# Patient Record
Sex: Male | Born: 1955
Health system: Southern US, Community
[De-identification: ages and names within clinical notes are randomized; demographics above are authoritative.]

## PROBLEM LIST (undated history)

## (undated) DIAGNOSIS — K5790 Diverticulosis of intestine, part unspecified, without perforation or abscess without bleeding: Secondary | ICD-10-CM

## (undated) DIAGNOSIS — I2510 Atherosclerotic heart disease of native coronary artery without angina pectoris: Secondary | ICD-10-CM

## (undated) DIAGNOSIS — I219 Acute myocardial infarction, unspecified: Secondary | ICD-10-CM

## (undated) DIAGNOSIS — F32A Depression, unspecified: Secondary | ICD-10-CM

## (undated) DIAGNOSIS — E039 Hypothyroidism, unspecified: Secondary | ICD-10-CM

## (undated) DIAGNOSIS — A0472 Enterocolitis due to Clostridium difficile, not specified as recurrent: Secondary | ICD-10-CM

## (undated) DIAGNOSIS — I509 Heart failure, unspecified: Secondary | ICD-10-CM

## (undated) DIAGNOSIS — F329 Major depressive disorder, single episode, unspecified: Secondary | ICD-10-CM

## (undated) DIAGNOSIS — T1491XA Suicide attempt, initial encounter: Secondary | ICD-10-CM

## (undated) DIAGNOSIS — K219 Gastro-esophageal reflux disease without esophagitis: Secondary | ICD-10-CM

## (undated) DIAGNOSIS — H269 Unspecified cataract: Secondary | ICD-10-CM

## (undated) DIAGNOSIS — I671 Cerebral aneurysm, nonruptured: Secondary | ICD-10-CM

## (undated) DIAGNOSIS — I639 Cerebral infarction, unspecified: Secondary | ICD-10-CM

## (undated) HISTORY — PX: APPENDECTOMY: SHX54

## (undated) HISTORY — PX: CHOLECYSTECTOMY: SHX55

## (undated) HISTORY — PX: HERNIA REPAIR: SHX51

## (undated) HISTORY — DX: Unspecified cataract: H26.9

## (undated) HISTORY — DX: Acute myocardial infarction, unspecified: I21.9

## (undated) HISTORY — PX: DIAGNOSTIC LAPAROSCOPY: SUR761

---

## 1998-05-10 ENCOUNTER — Inpatient Hospital Stay (HOSPITAL_COMMUNITY): Admission: EM | Admit: 1998-05-10 | Discharge: 1998-05-14 | Payer: Self-pay | Admitting: Emergency Medicine

## 1998-05-10 ENCOUNTER — Encounter: Payer: Self-pay | Admitting: Internal Medicine

## 1998-05-12 ENCOUNTER — Encounter: Payer: Self-pay | Admitting: Internal Medicine

## 2011-01-06 ENCOUNTER — Emergency Department: Payer: Self-pay | Admitting: Emergency Medicine

## 2014-05-23 LAB — COMPREHENSIVE METABOLIC PANEL
Albumin: 3.3 g/dL — ABNORMAL LOW (ref 3.4–5.0)
Alkaline Phosphatase: 24 U/L — ABNORMAL LOW
Anion Gap: 8 (ref 7–16)
BUN: 18 mg/dL (ref 7–18)
Bilirubin,Total: 0.4 mg/dL (ref 0.2–1.0)
Calcium, Total: 8.3 mg/dL — ABNORMAL LOW (ref 8.5–10.1)
Chloride: 106 mmol/L (ref 98–107)
Co2: 27 mmol/L (ref 21–32)
Creatinine: 1.25 mg/dL (ref 0.60–1.30)
EGFR (African American): >60
EGFR (Non-African Amer.): >60
Glucose: 106 mg/dL — ABNORMAL HIGH (ref 65–99)
Osmolality: 284 (ref 275–301)
Potassium: 3.8 mmol/L (ref 3.5–5.1)
SGOT(AST): 32 U/L (ref 15–37)
SGPT (ALT): 35 U/L
Sodium: 141 mmol/L (ref 136–145)
Total Protein: 7.5 g/dL (ref 6.4–8.2)

## 2014-05-23 LAB — CBC WITH DIFFERENTIAL/PLATELET
Basophil #: 0.1 10*3/uL (ref 0.0–0.1)
Basophil %: 0.7 %
Eosinophil #: 0.2 10*3/uL (ref 0.0–0.7)
Eosinophil %: 2.2 %
HCT: 38 % — ABNORMAL LOW (ref 40.0–52.0)
HGB: 12.7 g/dL — ABNORMAL LOW (ref 13.0–18.0)
Lymphocyte #: 1.5 10*3/uL (ref 1.0–3.6)
Lymphocyte %: 19.4 %
MCH: 29.8 pg (ref 26.0–34.0)
MCHC: 33.5 g/dL (ref 32.0–36.0)
MCV: 89 fL (ref 80–100)
Monocyte #: 1.4 x10 3/mm — ABNORMAL HIGH (ref 0.2–1.0)
Monocyte %: 17.7 %
Neutrophil #: 4.7 10*3/uL (ref 1.4–6.5)
Neutrophil %: 60 %
Platelet: 281 10*3/uL (ref 150–440)
RBC: 4.28 10*6/uL — ABNORMAL LOW (ref 4.40–5.90)
RDW: 12.9 % (ref 11.5–14.5)
WBC: 7.8 10*3/uL (ref 3.8–10.6)

## 2014-05-23 LAB — PROTIME-INR
INR: 1
Prothrombin Time: 13.3 secs (ref 11.5–14.7)

## 2014-05-23 LAB — TROPONIN I: Troponin-I: <0.02 ng/mL

## 2014-05-23 LAB — APTT: Activated PTT: 32 secs (ref 23.6–35.9)

## 2014-05-24 ENCOUNTER — Inpatient Hospital Stay: Payer: Self-pay | Admitting: Internal Medicine

## 2014-05-24 ENCOUNTER — Ambulatory Visit: Payer: Self-pay | Admitting: Neurology

## 2014-05-25 LAB — URINALYSIS, COMPLETE
BILIRUBIN, UR: NEGATIVE
Bacteria: NONE SEEN
Glucose,UR: NEGATIVE mg/dL (ref 0–75)
LEUKOCYTE ESTERASE: NEGATIVE
Nitrite: NEGATIVE
PH: 5 (ref 4.5–8.0)
Protein: 30
RBC,UR: 2 /HPF (ref 0–5)
Specific Gravity: 1.019 (ref 1.003–1.030)
Squamous Epithelial: NONE SEEN
WBC UR: 1 /HPF (ref 0–5)

## 2014-05-25 LAB — LIPID PANEL
Cholesterol: 101 mg/dL (ref 0–200)
HDL Cholesterol: 27 mg/dL — ABNORMAL LOW (ref 40–60)
Ldl Cholesterol, Calc: 53 mg/dL (ref 0–100)
Triglycerides: 103 mg/dL (ref 0–200)
VLDL Cholesterol, Calc: 21 mg/dL (ref 5–40)

## 2014-05-25 LAB — TSH: Thyroid Stimulating Horm: 0.211 u[IU]/mL — ABNORMAL LOW

## 2014-05-25 LAB — HEMOGLOBIN A1C: Hemoglobin A1C: 5.1 % (ref 4.2–6.3)

## 2014-05-26 LAB — URINE CULTURE

## 2014-05-27 LAB — CREATININE, SERUM
Creatinine: 1.27 mg/dL (ref 0.60–1.30)
EGFR (African American): >60

## 2014-05-30 DIAGNOSIS — A0472 Enterocolitis due to Clostridium difficile, not specified as recurrent: Secondary | ICD-10-CM

## 2014-05-30 HISTORY — DX: Enterocolitis due to Clostridium difficile, not specified as recurrent: A04.72

## 2014-06-28 ENCOUNTER — Inpatient Hospital Stay: Payer: Self-pay | Admitting: Internal Medicine

## 2014-06-28 LAB — URINALYSIS, COMPLETE
Bilirubin,UR: NEGATIVE
Blood: NEGATIVE
Glucose,UR: NEGATIVE mg/dL (ref 0–75)
Hyaline Cast: 9
Leukocyte Esterase: NEGATIVE
NITRITE: NEGATIVE
PROTEIN: NEGATIVE
Ph: 5 (ref 4.5–8.0)
Specific Gravity: 1.011 (ref 1.003–1.030)
Squamous Epithelial: NONE SEEN
WBC UR: 1 /HPF (ref 0–5)

## 2014-06-28 LAB — CBC
HCT: 44.4 % (ref 40.0–52.0)
HGB: 14.3 g/dL (ref 13.0–18.0)
MCH: 28 pg (ref 26.0–34.0)
MCHC: 32.1 g/dL (ref 32.0–36.0)
MCV: 87 fL (ref 80–100)
PLATELETS: 282 10*3/uL (ref 150–440)
RBC: 5.1 10*6/uL (ref 4.40–5.90)
RDW: 14.1 % (ref 11.5–14.5)
WBC: 39.8 10*3/uL — ABNORMAL HIGH (ref 3.8–10.6)

## 2014-06-28 LAB — COMPREHENSIVE METABOLIC PANEL
ALBUMIN: 3 g/dL — AB (ref 3.4–5.0)
ALK PHOS: 11 U/L — AB
AST: 14 U/L — AB (ref 15–37)
Anion Gap: 11 (ref 7–16)
BUN: 15 mg/dL (ref 7–18)
Bilirubin,Total: 0.4 mg/dL (ref 0.2–1.0)
CHLORIDE: 103 mmol/L (ref 98–107)
Calcium, Total: 8.4 mg/dL — ABNORMAL LOW (ref 8.5–10.1)
Co2: 23 mmol/L (ref 21–32)
Creatinine: 1.27 mg/dL (ref 0.60–1.30)
Glucose: 113 mg/dL — ABNORMAL HIGH (ref 65–99)
Osmolality: 275 (ref 275–301)
Potassium: 3.6 mmol/L (ref 3.5–5.1)
SGPT (ALT): 20 U/L
Sodium: 137 mmol/L (ref 136–145)
Total Protein: 6.7 g/dL (ref 6.4–8.2)

## 2014-06-28 LAB — TROPONIN I: Troponin-I: <0.02 ng/mL

## 2014-06-29 LAB — CBC WITH DIFFERENTIAL/PLATELET
Bands: 14 %
Comment - H1-Com1: NORMAL
HCT: 36.8 % — ABNORMAL LOW (ref 40.0–52.0)
HGB: 11.8 g/dL — AB (ref 13.0–18.0)
Lymphocytes: 3 %
MCH: 28 pg (ref 26.0–34.0)
MCHC: 32.1 g/dL (ref 32.0–36.0)
MCV: 87 fL (ref 80–100)
Monocytes: 4 %
PLATELETS: 238 10*3/uL (ref 150–440)
RBC: 4.21 10*6/uL — ABNORMAL LOW (ref 4.40–5.90)
RDW: 13.8 % (ref 11.5–14.5)
SEGMENTED NEUTROPHILS: 79 %
WBC: 43.3 10*3/uL — ABNORMAL HIGH (ref 3.8–10.6)

## 2014-06-29 LAB — MAGNESIUM: Magnesium: 1 mg/dL — ABNORMAL LOW

## 2014-06-29 LAB — BASIC METABOLIC PANEL
ANION GAP: 9 (ref 7–16)
BUN: 11 mg/dL (ref 7–18)
CALCIUM: 7.1 mg/dL — AB (ref 8.5–10.1)
CHLORIDE: 108 mmol/L — AB (ref 98–107)
CREATININE: 1.19 mg/dL (ref 0.60–1.30)
Co2: 21 mmol/L (ref 21–32)
GLUCOSE: 100 mg/dL — AB (ref 65–99)
Osmolality: 275 (ref 275–301)
POTASSIUM: 4 mmol/L (ref 3.5–5.1)
Sodium: 138 mmol/L (ref 136–145)

## 2014-06-29 LAB — CLOSTRIDIUM DIFFICILE(ARMC)

## 2014-06-30 LAB — CBC WITH DIFFERENTIAL/PLATELET
BASOS PCT: 0.1 %
Basophil #: 0 10*3/uL (ref 0.0–0.1)
Eosinophil #: 0 10*3/uL (ref 0.0–0.7)
Eosinophil %: 0 %
HCT: 32.5 % — ABNORMAL LOW (ref 40.0–52.0)
HGB: 10.6 g/dL — ABNORMAL LOW (ref 13.0–18.0)
LYMPHS PCT: 3.7 %
Lymphocyte #: 1 10*3/uL (ref 1.0–3.6)
MCH: 28.2 pg (ref 26.0–34.0)
MCHC: 32.7 g/dL (ref 32.0–36.0)
MCV: 86 fL (ref 80–100)
MONOS PCT: 4.1 %
Monocyte #: 1.1 x10 3/mm — ABNORMAL HIGH (ref 0.2–1.0)
NEUTROS ABS: 24.8 10*3/uL — AB (ref 1.4–6.5)
NEUTROS PCT: 92.1 %
Platelet: 215 10*3/uL (ref 150–440)
RBC: 3.77 10*6/uL — AB (ref 4.40–5.90)
RDW: 14.3 % (ref 11.5–14.5)
WBC: 26.9 10*3/uL — ABNORMAL HIGH (ref 3.8–10.6)

## 2014-06-30 LAB — BASIC METABOLIC PANEL
ANION GAP: 7 (ref 7–16)
BUN: 9 mg/dL (ref 7–18)
CREATININE: 1 mg/dL (ref 0.60–1.30)
Calcium, Total: 7.2 mg/dL — ABNORMAL LOW (ref 8.5–10.1)
Chloride: 108 mmol/L — ABNORMAL HIGH (ref 98–107)
Co2: 24 mmol/L (ref 21–32)
GLUCOSE: 104 mg/dL — AB (ref 65–99)
OSMOLALITY: 277 (ref 275–301)
POTASSIUM: 3.4 mmol/L — AB (ref 3.5–5.1)
SODIUM: 139 mmol/L (ref 136–145)

## 2014-06-30 LAB — MAGNESIUM: Magnesium: 1.8 mg/dL

## 2014-07-01 LAB — BASIC METABOLIC PANEL
ANION GAP: 7 (ref 7–16)
BUN: 6 mg/dL — AB (ref 7–18)
CO2: 24 mmol/L (ref 21–32)
Calcium, Total: 7.3 mg/dL — ABNORMAL LOW (ref 8.5–10.1)
Chloride: 111 mmol/L — ABNORMAL HIGH (ref 98–107)
Creatinine: 0.95 mg/dL (ref 0.60–1.30)
EGFR (African American): >60
EGFR (Non-African Amer.): >60
Glucose: 104 mg/dL — ABNORMAL HIGH (ref 65–99)
OSMOLALITY: 281 (ref 275–301)
Potassium: 3.4 mmol/L — ABNORMAL LOW (ref 3.5–5.1)
Sodium: 142 mmol/L (ref 136–145)

## 2014-07-01 LAB — MAGNESIUM: Magnesium: 1.6 mg/dL — ABNORMAL LOW

## 2014-07-01 LAB — CBC WITH DIFFERENTIAL/PLATELET
BASOS ABS: 0 10*3/uL (ref 0.0–0.1)
Basophil %: 0.3 %
Eosinophil #: 0.2 10*3/uL (ref 0.0–0.7)
Eosinophil %: 1.4 %
HCT: 31.4 % — ABNORMAL LOW (ref 40.0–52.0)
HGB: 10.3 g/dL — AB (ref 13.0–18.0)
LYMPHS ABS: 1.1 10*3/uL (ref 1.0–3.6)
Lymphocyte %: 10.1 %
MCH: 28.3 pg (ref 26.0–34.0)
MCHC: 32.9 g/dL (ref 32.0–36.0)
MCV: 86 fL (ref 80–100)
MONO ABS: 0.7 x10 3/mm (ref 0.2–1.0)
Monocyte %: 6.2 %
NEUTROS PCT: 82 %
Neutrophil #: 9.1 10*3/uL — ABNORMAL HIGH (ref 1.4–6.5)
Platelet: 215 10*3/uL (ref 150–440)
RBC: 3.64 10*6/uL — AB (ref 4.40–5.90)
RDW: 14.9 % — ABNORMAL HIGH (ref 11.5–14.5)
WBC: 11.2 10*3/uL — AB (ref 3.8–10.6)

## 2014-07-02 DIAGNOSIS — Z885 Allergy status to narcotic agent status: Secondary | ICD-10-CM | POA: Diagnosis not present

## 2014-07-02 DIAGNOSIS — R1310 Dysphagia, unspecified: Secondary | ICD-10-CM | POA: Diagnosis not present

## 2014-07-02 DIAGNOSIS — I2510 Atherosclerotic heart disease of native coronary artery without angina pectoris: Secondary | ICD-10-CM | POA: Diagnosis not present

## 2014-07-02 DIAGNOSIS — Z87891 Personal history of nicotine dependence: Secondary | ICD-10-CM | POA: Diagnosis not present

## 2014-07-02 DIAGNOSIS — A419 Sepsis, unspecified organism: Secondary | ICD-10-CM | POA: Diagnosis not present

## 2014-07-02 DIAGNOSIS — I5042 Chronic combined systolic (congestive) and diastolic (congestive) heart failure: Secondary | ICD-10-CM | POA: Diagnosis not present

## 2014-07-02 DIAGNOSIS — I69391 Dysphagia following cerebral infarction: Secondary | ICD-10-CM | POA: Diagnosis not present

## 2014-07-02 DIAGNOSIS — A047 Enterocolitis due to Clostridium difficile: Secondary | ICD-10-CM | POA: Diagnosis not present

## 2014-07-02 LAB — BASIC METABOLIC PANEL
Anion Gap: 9 (ref 7–16)
BUN: 3 mg/dL — ABNORMAL LOW (ref 7–18)
CREATININE: 0.86 mg/dL (ref 0.60–1.30)
Calcium, Total: 7.5 mg/dL — ABNORMAL LOW (ref 8.5–10.1)
Chloride: 110 mmol/L — ABNORMAL HIGH (ref 98–107)
Co2: 25 mmol/L (ref 21–32)
EGFR (African American): >60
EGFR (Non-African Amer.): >60
Glucose: 83 mg/dL (ref 65–99)
Osmolality: 283 (ref 275–301)
Potassium: 3.5 mmol/L (ref 3.5–5.1)
SODIUM: 144 mmol/L (ref 136–145)

## 2014-07-02 LAB — MAGNESIUM: Magnesium: 1.7 mg/dL — ABNORMAL LOW

## 2014-07-03 LAB — BASIC METABOLIC PANEL
Anion Gap: 7 (ref 7–16)
BUN: 3 mg/dL — ABNORMAL LOW (ref 7–18)
CHLORIDE: 108 mmol/L — AB (ref 98–107)
CREATININE: 0.85 mg/dL (ref 0.60–1.30)
Calcium, Total: 7.7 mg/dL — ABNORMAL LOW (ref 8.5–10.1)
Co2: 26 mmol/L (ref 21–32)
EGFR (African American): >60
EGFR (Non-African Amer.): >60
Glucose: 85 mg/dL (ref 65–99)
OSMOLALITY: 277 (ref 275–301)
Potassium: 3.4 mmol/L — ABNORMAL LOW (ref 3.5–5.1)
SODIUM: 141 mmol/L (ref 136–145)

## 2014-07-03 LAB — MAGNESIUM: Magnesium: 1.7 mg/dL — ABNORMAL LOW

## 2014-07-03 LAB — CULTURE, BLOOD (SINGLE)

## 2014-09-25 ENCOUNTER — Other Ambulatory Visit: Payer: Self-pay

## 2014-09-25 NOTE — Patient Outreach (Signed)
[name]) Care Management  09/25/2014  Donald [name] 08/08/1955 [id number]     RN CM spoke with patient about the services of [facility].  Patient stated did not need the services of [facility].  RN CM inquired to patient as how was he managing his chronic disease of congestive heart failure.  Patient states he was doing well with self-managing his heart failure.  Patient states he keeps his appointment with his cardiologist, [name] in [place], [place].  Patient states he monitors his weight.  States today his weight is 140 lbs.  States he understands his medications and why he takes it.  Patient stated he needed help with getting dentures and asked if Case Manager could help find resources.  RN CM found 5 resources in his area for affordable denture care.  RN CM will mail these resources to patient's home address and patient should receive the information in a few days.   RN CM will notify patient's primary doctor of patient's decision to decline the services of [facility].  Maury Dus, RN, Ishmael Holter, [place] Telephonic Care Coordinator [phone]

## 2014-09-29 NOTE — Patient Outreach (Signed)
[facility]) Care Management  09/29/2014  Donald [name] 1955/07/18 [id number]   Received notification from Maury Dus, RN to close case as patient refused to participate at this time.  Case closed at this time.  [name]. [place] CM Assistant Phone: [phone] Fax: [phone]

## 2014-10-06 ENCOUNTER — Ambulatory Visit
Admit: 2014-10-06 | Disposition: A | Discharge status: Home / Self Care | Payer: Self-pay | Attending: Unknown Physician Specialty | Admitting: Unknown Physician Specialty

## 2014-10-21 NOTE — Discharge Summary (Signed)
PATIENT NAME:  Donald [name], Donald [name] MR#:  [id number] DATE OF BIRTH:  February 09, 1956  DISCHARGE DIAGNOSES: 1.  Acute ischemic infarct of left insula and frontal operculum. 2.  Aspiration pneumonia.  3.  Sepsis.  4.  Dysphagia.  5.  Vascular dementia.  6.  Dysarthria.   CONSULTATIONS:  1.  Leotis Pain, MD with neurology.  2.  Speech therapy.   IMAGING STUDIES:  1.  CT scan of the head with contrast. Initial CT was normal other than chronic lacunar infarcts. Repeat CT showed acute evolving stroke.  2.  MRI of the brain showed acute infarct of the frontal operculum and insular area.  3.  Carotid Doppler showed no significant stenosis.  4.  Echocardiogram showed EF of 40% to 45%. No PFO or thrombus.   ADMITTING HISTORY AND PHYSICAL:  Please see dictated H and P dictated previously by Dr. Marcille Blanco. In brief, a 59 year old Caucasian male patient brought into the hospital with garbled speech, dysarthria, and facial droop. The patient was admitted for CVA.   HOSPITAL COURSE: 1.  Acute CVA. The patient had an MRI of the brain done which showed acute CVA.  The patient initially was n.p.o., was seen by speech, later started on a modified diet. His symptoms seem to worsen initially, but later improved. By day of discharge, the patient is feeling well, although does seem to have some vascular dementia, which has worsened. Has dysphagia, dysarthria. Has worked with PT and is being set up with home health PT and speech therapy. He is on aspirin, statin. Blood pressure medications have been stopped for permissive hypertension. 2.   Aspiration pneumonia with sepsis. The patient  secondary to his dysphagia from acute stroke, had aspiration, was not needing any oxygen. Has been started on antibiotics and has done well. No shortness of breath, has mild cough. He did have fever, which has resolved.  3.  Hypertension. The patient's blood pressure medications have been stopped for permissive hypertension. He is being  discharged on a low dose of Coreg.   Prior to discharge, the patient has right tongue deviation. Motor strength mildly decreased on the right side. He is awake, alert. Lungs sound clear. S1, S2 heard.   DISCHARGE MEDICATIONS: 1.  Aspirin 81 mg daily.  2.  Simvastatin 20 mg daily.  3.  Omeprazole 20 mg daily. 4.  Lasix 20 mg daily.  5.  Colace 100 mg 2 times a day.  6.  Niacin 250 mg oral once a day.  7.  Nitroglycerin 0.4 sublingual as needed for chest pain.  8.  Colestipol 1 gram oral 2 times a day.  9.  Acetaminophen 325 mg 2 tablets every 4 hours as needed.  10.  Flomax 0.4 mg oral once a day.  11.  Oxycodone 5 mg oral every 4 hours.  12.  Coreg 3.125 mg oral 2 times a day.  13.  Avelox 400 mg oral 2 times a day for 1 week.  14.  Aldactone and lisinopril were stopped.  DISCHARGE INSTRUCTIONS: Home health with physical therapy, speech therapy has been set up, low-sodium, low-fat diet with pureed nectar-thickened liquids, no straws. Strict aspiration precautions. Medicine pureed, moisten food well.  Follow up with primary care physician in 1-2 weeks.   TIME SPENT ON DAY OF DISCHARGE IN DISCHARGE ACTIVITY: 40 minutes.   ____________________________ Leia Alf Keion Neels, MD srs:LT D: 05/29/2014 16:45:28 ET T: 05/29/2014 18:32:43 ET JOB#: [id number]  cc: Alveta Heimlich R. Darvin Neighbours, MD, <Dictator> Neita Carp MD ELECTRONICALLY SIGNED 06/15/2014  13:05 

## 2014-10-21 NOTE — H&P (Signed)
PATIENT NAME:  Donald [name], Donald [name] MR#:  [id number] DATE OF BIRTH:  1955/08/17  DATE OF ADMISSION:  06/28/2014  REFERRING PHYSICIAN:  Debbrah Alar, MD  PRIMARY CARE PHYSICIAN:  Salome Holmes, MD  CHIEF COMPLAINT:  Weakness.   HISTORY OF PRESENT ILLNESS:  This is a 59 year old Caucasian gentleman with history of CVA with residual dysphagia and recent aspiration pneumonia, as well as Clostridium difficile, presenting with weakness. He was admitted to [facility] on 05/24/2014, to 05/27/2014, with discharge diagnosis of acute ischemic infarct  Course complicated by aspiration pneumonia and developed Clostridium difficile as an outpatient, taken last dose of Flagyl one day prior to arrival. Throughout that duration, he had poor p.o. intake originally given his dysphagia diet with thickened liquids and then had poor p.o. intake given his Clostridium difficile. Today, he complained of worsening weakness and had recurrent diarrhea. Denies any chest pain, shortness of breath, fevers, chills, or further symptomatology. He presented to the hospital for further workup and evaluation.   REVIEW OF SYSTEMS: CONSTITUTIONAL:  Denies fevers or chills. Positive for fatigue and weakness.  EYES:  Denies blurred vision, double vision, or eye pain.  EARS, NOSE, AND THROAT:  Denies tinnitus, ear pain, or hearing loss.  RESPIRATORY:  Denies cough, wheeze, or shortness of breath.  CARDIOVASCULAR:  Denies chest pain, palpitations, or edema.  GASTROINTESTINAL:  Denies any nausea, vomiting, or abdominal pain. Positive for diarrhea.  GENITOURINARY:  Denies dysuria or hematuria.  ENDOCRINE:  Denies nocturia or thyroid problems.  HEMATOLOGIC:  Denies easy bruising or bleeding. SKIN:  Denies any rash or lesions.  MUSCULOSKELETAL:  Denies any pain in the neck, back, shoulder, knees, or hips or arthritic symptoms.  NEUROLOGIC:  Denies paralysis or paresthesias.  PSYCHIATRIC:  Denies anxiety or depressive symptoms.    Otherwise, a full review of systems performed by me is negative.   PAST MEDICAL HISTORY:  Coronary artery disease, combined systolic and diastolic congestive heart failure, history of CVA with residual dysphagia.   SOCIAL HISTORY:  Remote tobacco abuse.   FAMILY HISTORY:  Positive for lung cancer but no known cardiovascular or pulmonary disorders.   ALLERGIES:  PENICILLIN, SULFA DRUGS, TRAMADOL.   HOME MEDICATIONS:  Include spironolactone 25 mg p.o. daily, aspirin 81 mg p.o. daily, lisinopril 10 mg p.o. daily, Flomax 0.4 mg p.o. daily, nitroglycerin 0.4 mg sublingually every 5 minutes as needed for chest pain, trazodone 100 mg p.o. at bedtime as needed, colestipol 1 gram 2 tablets b.i.d., niacin 250 mg p.o. daily, simvastatin 20 mg p.o. at bedtime, Plavix 75 mg p.o. daily, ketoconazole topical shampoo to affected area twice weekly, Lasix 20 mg p.o. daily, simethicone 125 mg daily as needed, pantoprazole 40 mg p.o. daily, multivitamin 1 tab p.o. daily, vitamin B12, 1000 mcg p.o. daily.   PHYSICAL EXAMINATION: VITAL SIGNS:  Temperature 97.8, heart rate 129, respirations 27, blood pressure 129/71, saturating 95% on room air. Weight 63.5 kg, BMI 24.8.  GENERAL:  Weak-appearing Caucasian gentleman, currently in no acute distress.  HEAD:  Normocephalic, atraumatic.  EYES:  Pupils are equal, round, and reactive to light. Extraocular muscles are intact. No scleral icterus.  MOUTH:  Dry mucosal membranes. Dentition is intact. No abscess noted.  EARS, NOSE, AND THROAT:  Clear without exudates. No external lesions.  NECK:  Supple. No thyromegaly. No nodules. No JVD.  PULMONARY:  Clear to auscultation bilaterally without wheezes, rubs, or rhonchi. No use of accessory muscles. Good respiratory effort.  CHEST:  Nontender to palpation.  CARDIOVASCULAR:  S1  and S2, regular rate and rhythm. No murmurs, rubs, or gallops. No edema. Pedal pulses are 2+ bilaterally.  GASTROINTESTINAL:  Soft, nontender,  nondistended. No masses. Positive bowel sounds. No hepatosplenomegaly.  MUSCULOSKELETAL:  No swelling, clubbing, or edema. Range of motion is full in all extremities.  NEUROLOGIC:  Cranial nerves II through XII are intact. No gross focal neurological deficits. Sensation is intact. Reflexes are intact.  SKIN:  No ulcerations, lesions, rash, or cyanosis. Skin is warm and dry. Turgor is intact.  PSYCHIATRIC:  Mood and affect within normal limits. The patient is awake, alert, and oriented x 3. Insight and judgment are intact.   LABORATORY DATA:  Sodium 137, potassium 3.6, chloride 103, bicarbonate 23, BUN 15, creatinine 1.27, glucose 113. LFTs:  Albumin 3, alkaline phosphatase 11, AST 14, otherwise within normal limits. WBC is 39.8, hemoglobin 14.3, and platelets 282,000. Urinalysis is negative for evidence of infection. Lactic acid is 1.3.   CT of the head performed reveals no acute findings. There is evidence of a subacute chronic left infarct. Chest x-ray reveals bibasilar opacities greater on the left, favor infection, difficult to exclude an early pneumonia of the left lung base.   ASSESSMENT AND PLAN:  This is a 59 year old Caucasian gentleman with a history of recent cerebrovascular accident with residual dysphagia and recent aspiration as well as Clostridium difficile, presenting with weakness.   1.  Sepsis, meeting septic criteria by heart rate and leukocytosis. Panculture including blood and urine and also send a stool sample for Clostridium difficile. Chest x-ray is concerning for pneumonia. Given recent hospitalization, we will cover for healthcare-associated pneumonia and suspect Clostridium difficile may also be a likely culprit. If Clostridium difficile positive and no further pulmonary symptoms, would actually stop the antibiotics currently for pneumonia. Provide intravenous fluid hydration. Keep mean arterial pressure greater than 65, however, will have low intravenous fluid rate given  combined systolic and diastolic congestive heart failure.  2.  Cerebrovascular accident with dysphagia. Continue dysphagia diet. We will also get speech therapy involved to see if he can be taken off of the dysphagia diet.  3.  Combined systolic and diastolic congestive heart failure, not in acute exacerbation. Continue with aspirin, beta blockade, lisinopril, and spironolactone. 4.  Gastroesophageal reflux disease.  Proton pump inhibitor therapy.  5.  Venous thromboembolism therapy with heparin subcutaneously.   CODE STATUS:  The patient is a full code.  TIME SPENT:  45 minutes.    ____________________________ Aaron Mose. Awanda Wilcock, MD dkh:nb D: 06/29/2014 01:19:46 ET T: 06/29/2014 01:52:46 ET JOB#: [id number]  cc: Aaron Mose. Amiere Cawley, MD, <Dictator> Emalea Mix Woodfin Ganja MD ELECTRONICALLY SIGNED 06/29/2014 3:01
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
## 2014-10-21 NOTE — Consult Note (Signed)
Brief Consult Note: Diagnosis: sepsis, leukocytosis, likely C diff.   Patient was seen by consultant.   Consult note dictated.   Recommend further assessment or treatment.   Orders entered.   Discussed with Attending MD.   Comments: Doubt pna as no cough or pulm sxs WIth impressive leukocytosis and diarrhea he likely has severe c diff.  Lactic acid is nml.  Abd exam benign - no evidence yet of toxic megacolon  Rec oral vanco and iv flagyl can stop other abx and monitor.  Electronic Signatures: Angelena Form (MD)  (Signed 31-Dec-15 10:45)  Authored: Brief Consult Note   Last Updated: 31-Dec-15 10:45 by Angelena Form (MD)

## 2014-10-25 NOTE — Consult Note (Signed)
PATIENT NAME:  Donald [name], SCHOEN MR#:  [id number] DATE OF BIRTH:  1956-02-07  DATE OF CONSULTATION:  06/29/2014  REFERRING PHYSICIAN:  Demetrios Loll, MD CONSULTING PHYSICIAN:  Cheral Marker. Ola Spurr, MD  REASON FOR CONSULTATION: Sepsis.   HISTORY OF PRESENT ILLNESS: This is a pleasant 59 year old gentleman with history of CVA with dysphagia and recurrent aspiration pneumonia, as well as prior recent Clostridium difficile infection. He was admitted on 06/28/2014 with decreased p.o. intake, weakness, and recurrent diarrhea. The patient was admitted to the intensive care unit for hemodynamic instability. His white blood count was markedly elevated at 39,000. We are consulted for further evaluation.   Currently, the patient has a rectal tube in. He is having a lot of diarrhea. He denies any cough, fevers, abdominal pain, dysuria, or skin lesions.   PAST MEDICAL HISTORY:  Coronary artery disease, CHF, CVA with residual dysphagia.    SURGICAL HISTORY: None.   SOCIAL HISTORY: Prior tobacco abuse.   FAMILY HISTORY: Positive for lung cancer.   ALLERGIES: PENICILLIN, SULFA, AND TRAMADOL.   REVIEW OF SYSTEMS: Eleven systems reviewed and negative, except as per HPI.   PHYSICAL EXAMINATION:  VITAL SIGNS: Temperature 98.5, pulse 99, blood pressure 118/66, respirations 22, saturation 95% on room air.  GENERAL: He is pleasant, awake, alert, able to converse with some difficulty.  HEENT: Pupils equal, round, reactive to light and accommodation. Oropharynx is clear. No thrush.  NECK: Supple.  HEART: Tachy, but regular.  LUNGS: Clear.  ABDOMEN: Soft, mildly distended, nontender. No rebound or guarding.  EXTREMITIES: No clubbing, cyanosis, or edema.  SKIN: No rash or lesions.   LABORATORY DATA: White blood count on admission was 39.8, today was 43.3; hemoglobin 11.8, platelets 238,000. Blood cultures on 06/28/2014: No growth to date. Clostridium difficile is pending. Urinalysis is negative. Lactic acid was  1.3.   IMAGING: CT of the Head: There is no acute disease. Chest X-ray: Basilar opacities; favor an infection.   IMPRESSION: A 59 year old gentleman with history of recurrent aspiration pneumonia due to his cerebrovascular accident with dysphagia, now with diarrhea and marked leukocytosis. He has a rectal tube in. He had a recent diagnosis of Clostridium difficile and was treated for this. It is unclear when he finished his treatment.   I suspect this is all Clostridium difficile. He has no other symptoms to suggest a pneumonia at this time. Chest x-ray does show some infiltrate versus atelectasis, but clinically he is stable.  RECOMMENDATIONS:  1.  Check Clostridium difficile.  2.  Start oral vancomycin and IV Flagyl.  3.  Would suggest stopping other antibiotics, pending further workup.  4.  We will monitor him closely for other infections as we treat the  presumed Clostridium difficile.  5.  Thank you for the consult. I will be glad to follow with you.    ____________________________ Cheral Marker. Ola Spurr, MD dpf:MT D: 07/01/2014 11:27:08 ET T: 07/01/2014 11:36:22 ET JOB#: [id number]  cc: Cheral Marker. Ola Spurr, MD, <Dictator> Farrin Shadle Ola Spurr MD ELECTRONICALLY SIGNED 07/02/2014 21:29

## 2014-10-29 NOTE — Discharge Summary (Signed)
PATIENT NAME:  Donald [name], Donald [name] MR#:  [id number] DATE OF BIRTH:  April 18, 1956  DATE OF ADMISSION:  06/28/2014 DATE OF DISCHARGE:  07/03/2014  PRIMARY CARE PHYSICIAN: Dr. Herbie Saxon.  DISCHARGE DIAGNOSES: Sepsis with severe Clostridium difficile colitis, hypokalemia, hypomagnesemia, history of cerebrovascular accident with dysphagia, chronic systolic and diastolic congestive heart failure.   CONDITION: Stable.   CODE STATUS: Full code.   HOME MEDICATIONS: Please refer to the medication reconciliation list.   DIET: Low sodium, low fat, low cholesterol diet. The patient also on mechanical soft, thin liquid diet with aspiration precautions. The patient needs home health with physical therapy, nurse and nurse aide.    ACTIVITY: As tolerated.    FOLLOWUP CARE: Follow up with PCP and Dr. Ola Spurr within 1 to 2 weeks.   REASON FOR ADMISSION: Weakness.   HOSPITAL COURSE: The patient is a Donald [name] with a history of CVA and recent aspiration pneumonia and Clostridium difficile who presented to the ED with weakness. The patient was recently treated for aspiration pneumonia and developed Clostridium difficile colitis, which was treated with Flagyl.  The patient came to ED due to weakness and recurrent diarrhea. For detailed history and physical examination, please refer to the admission note dictated by Dr. Lavetta Nielsen.   On admission date, the patient's sodium was 137, potassium 3.6, chloride 103, bicarbonate 23, BUN 15, creatinine 1.27, WBC 39.8, hemoglobin 14.3. CAT scan of head showed no acute findings. Chest x-ray reveals bibasilar opacity within the left, difficult to exclude an early pneumonia.  1.  Sepsis with severe Clostridium difficile colitis: After admission, initially the patient was treated with Flagyl since the patient's stool sample showed Clostridium difficile positive. The patient also had severe diarrhea multiple times a day. The patient has been treated with p.o. vancomycin.  The patient's WBC decreased to normal range. Abdominal pain, diarrhea has much improved. The patient has no complaints of abdominal pain or diarrhea today. Since the patient's chest x-ray showed questionable pneumonia, the patient was treated with Levaquin, but the patient has no respiratory symptoms, antibiotics were discontinued.  2.  Hypokalemia and hypomagnesemia due to diarrhea: The patient has been treated with potassium and magnesium supplement. The patient's potassium is 3.4, magnesium is 1.7. We will continue p.o. supplement after discharge.  3.  For CVA with dysphagia, the patient has been treated with aspirin, Plavix, Zocor. Speech study suggested a mechanical soft diet with thin liquid, aspiration precaution.  4.  Chronic systolic and diastolic CHF, stable.   The patient's symptoms have much improved. His vital signs are stable. He is clinically stable, will be discharged to home with home health and PT today. Discussed the patient's discharge plan with the patient and the patient's wife, nurse, case Freight forwarder.   TIME SPENT: About 42 minutes.   ____________________________ Demetrios Loll, MD qc:AT D: 07/03/2014 16:27:39 ET T: 07/03/2014 23:09:52 ET JOB#: [id number]  cc: Demetrios Loll, MD, <Dictator> Demetrios Loll MD ELECTRONICALLY SIGNED 07/04/2014 15:35

## 2014-11-23 ENCOUNTER — Emergency Department: Payer: Medicare PPO

## 2014-11-23 ENCOUNTER — Encounter: Payer: Self-pay | Admitting: Radiology

## 2014-11-23 ENCOUNTER — Inpatient Hospital Stay
Admission: EM | Admit: 2014-11-23 | Discharge: 2014-11-25 | DRG: 378 | Disposition: A | Discharge status: Home / Self Care | Payer: Medicare PPO | Attending: Internal Medicine | Admitting: Internal Medicine

## 2014-11-23 DIAGNOSIS — A0472 Enterocolitis due to Clostridium difficile, not specified as recurrent: Secondary | ICD-10-CM | POA: Insufficient documentation

## 2014-11-23 DIAGNOSIS — I69321 Dysphasia following cerebral infarction: Secondary | ICD-10-CM

## 2014-11-23 DIAGNOSIS — Z7902 Long term (current) use of antithrombotics/antiplatelets: Secondary | ICD-10-CM

## 2014-11-23 DIAGNOSIS — K5791 Diverticulosis of intestine, part unspecified, without perforation or abscess with bleeding: Principal | ICD-10-CM

## 2014-11-23 DIAGNOSIS — I2510 Atherosclerotic heart disease of native coronary artery without angina pectoris: Secondary | ICD-10-CM | POA: Diagnosis present

## 2014-11-23 DIAGNOSIS — Z79899 Other long term (current) drug therapy: Secondary | ICD-10-CM | POA: Diagnosis not present

## 2014-11-23 DIAGNOSIS — A047 Enterocolitis due to Clostridium difficile: Secondary | ICD-10-CM | POA: Diagnosis present

## 2014-11-23 DIAGNOSIS — Z7982 Long term (current) use of aspirin: Secondary | ICD-10-CM | POA: Diagnosis not present

## 2014-11-23 DIAGNOSIS — K5790 Diverticulosis of intestine, part unspecified, without perforation or abscess without bleeding: Secondary | ICD-10-CM | POA: Diagnosis present

## 2014-11-23 DIAGNOSIS — Z880 Allergy status to penicillin: Secondary | ICD-10-CM | POA: Diagnosis not present

## 2014-11-23 DIAGNOSIS — I509 Heart failure, unspecified: Secondary | ICD-10-CM | POA: Diagnosis present

## 2014-11-23 DIAGNOSIS — I10 Essential (primary) hypertension: Secondary | ICD-10-CM | POA: Diagnosis present

## 2014-11-23 DIAGNOSIS — Z79891 Long term (current) use of opiate analgesic: Secondary | ICD-10-CM

## 2014-11-23 DIAGNOSIS — I671 Cerebral aneurysm, nonruptured: Secondary | ICD-10-CM | POA: Diagnosis present

## 2014-11-23 DIAGNOSIS — Z882 Allergy status to sulfonamides status: Secondary | ICD-10-CM | POA: Diagnosis not present

## 2014-11-23 DIAGNOSIS — E039 Hypothyroidism, unspecified: Secondary | ICD-10-CM | POA: Diagnosis present

## 2014-11-23 DIAGNOSIS — Z87891 Personal history of nicotine dependence: Secondary | ICD-10-CM

## 2014-11-23 DIAGNOSIS — K648 Other hemorrhoids: Secondary | ICD-10-CM | POA: Diagnosis present

## 2014-11-23 DIAGNOSIS — Z885 Allergy status to narcotic agent status: Secondary | ICD-10-CM | POA: Diagnosis not present

## 2014-11-23 DIAGNOSIS — K219 Gastro-esophageal reflux disease without esophagitis: Secondary | ICD-10-CM | POA: Diagnosis present

## 2014-11-23 DIAGNOSIS — K922 Gastrointestinal hemorrhage, unspecified: Secondary | ICD-10-CM | POA: Diagnosis present

## 2014-11-23 HISTORY — DX: Diverticulosis of intestine, part unspecified, without perforation or abscess without bleeding: K57.90

## 2014-11-23 HISTORY — DX: Enterocolitis due to Clostridium difficile, not specified as recurrent: A04.72

## 2014-11-23 HISTORY — DX: Cerebral aneurysm, nonruptured: I67.1

## 2014-11-23 HISTORY — DX: Gastro-esophageal reflux disease without esophagitis: K21.9

## 2014-11-23 HISTORY — DX: Atherosclerotic heart disease of native coronary artery without angina pectoris: I25.10

## 2014-11-23 HISTORY — DX: Heart failure, unspecified: I50.9

## 2014-11-23 HISTORY — DX: Hypothyroidism, unspecified: E03.9

## 2014-11-23 HISTORY — DX: Cerebral infarction, unspecified: I63.9

## 2014-11-23 LAB — TYPE AND SCREEN
ABO/RH(D): O POS
ANTIBODY SCREEN: NEGATIVE

## 2014-11-23 LAB — COMPREHENSIVE METABOLIC PANEL
ALK PHOS: 18 U/L — AB (ref 38–126)
ALT: 14 U/L — ABNORMAL LOW (ref 17–63)
ANION GAP: 9 (ref 5–15)
AST: 21 U/L (ref 15–41)
Albumin: 4.2 g/dL (ref 3.5–5.0)
BILIRUBIN TOTAL: 0.7 mg/dL (ref 0.3–1.2)
BUN: 31 mg/dL — AB (ref 6–20)
CHLORIDE: 102 mmol/L (ref 101–111)
CO2: 27 mmol/L (ref 22–32)
Calcium: 9.4 mg/dL (ref 8.9–10.3)
Creatinine, Ser: 1.49 mg/dL — ABNORMAL HIGH (ref 0.61–1.24)
GFR, EST AFRICAN AMERICAN: 58 mL/min — AB (ref >60)
GFR, EST NON AFRICAN AMERICAN: 50 mL/min — AB (ref >60)
Glucose, Bld: 104 mg/dL — ABNORMAL HIGH (ref 65–99)
Potassium: 4.6 mmol/L (ref 3.5–5.1)
Sodium: 138 mmol/L (ref 135–145)
TOTAL PROTEIN: 8 g/dL (ref 6.5–8.1)

## 2014-11-23 LAB — CBC
HCT: 33.5 % — ABNORMAL LOW (ref 40.0–52.0)
HCT: 34.4 % — ABNORMAL LOW (ref 40.0–52.0)
HEMATOCRIT: 37 % — AB (ref 40.0–52.0)
HEMOGLOBIN: 11.9 g/dL — AB (ref 13.0–18.0)
Hemoglobin: 12.2 g/dL — ABNORMAL LOW (ref 13.0–18.0)
Hemoglobin: 11.3 g/dL — ABNORMAL LOW (ref 13.0–18.0)
MCH: 28.8 pg (ref 26.0–34.0)
MCH: 29.2 pg (ref 26.0–34.0)
MCH: 30.2 pg (ref 26.0–34.0)
MCHC: 33 g/dL (ref 32.0–36.0)
MCHC: 33.7 g/dL (ref 32.0–36.0)
MCHC: 34.7 g/dL (ref 32.0–36.0)
MCV: 87.3 fL (ref 80.0–100.0)
MCV: 86.6 fL (ref 80.0–100.0)
MCV: 87.1 fL (ref 80.0–100.0)
Platelets: 280 10*3/uL (ref 150–440)
Platelets: 256 10*3/uL (ref 150–440)
Platelets: 234 10*3/uL (ref 150–440)
RBC: 4.23 MIL/uL — ABNORMAL LOW (ref 4.40–5.90)
RBC: 3.86 MIL/uL — ABNORMAL LOW (ref 4.40–5.90)
RBC: 3.95 MIL/uL — ABNORMAL LOW (ref 4.40–5.90)
RDW: 13.6 % (ref 11.5–14.5)
RDW: 13.4 % (ref 11.5–14.5)
RDW: 13.7 % (ref 11.5–14.5)
WBC: 13.1 10*3/uL — AB (ref 3.8–10.6)
WBC: 10.7 10*3/uL — ABNORMAL HIGH (ref 3.8–10.6)
WBC: 8.6 10*3/uL (ref 3.8–10.6)

## 2014-11-23 LAB — ABO/RH: ABO/RH(D): O POS

## 2014-11-23 MED ORDER — ACETAMINOPHEN 325 MG PO TABS: 650.0000 mg | ORAL_TABLET | ORAL | Status: DC | PRN

## 2014-11-23 MED ORDER — PANTOPRAZOLE SODIUM 40 MG IV SOLR: 40.0000 mg | Freq: Two times a day (BID) | INTRAVENOUS | Status: DC

## 2014-11-23 MED ORDER — COLESTIPOL HCL 1 G PO TABS
2.0000 g | ORAL_TABLET | Freq: Two times a day (BID) | ORAL | Status: DC
  Administered 2014-11-23 – 2014-11-24 (×2): 2 g via ORAL
  Filled 2014-11-23 (×5): qty 2

## 2014-11-23 MED ORDER — ONDANSETRON HCL 4 MG PO TABS: 4.0000 mg | ORAL_TABLET | Freq: Four times a day (QID) | ORAL | Status: DC | PRN

## 2014-11-23 MED ORDER — SODIUM CHLORIDE 0.9 % IV BOLUS (SEPSIS)
1000.0000 mL | Freq: Once | INTRAVENOUS | Status: AC
  Administered 2014-11-23: 1000 mL via INTRAVENOUS

## 2014-11-23 MED ORDER — OXYCODONE HCL 5 MG PO TABS
5.0000 mg | ORAL_TABLET | ORAL | Status: DC
  Administered 2014-11-24 – 2014-11-25 (×3): 5 mg via ORAL
  Filled 2014-11-23 (×6): qty 1

## 2014-11-23 MED ORDER — SODIUM CHLORIDE 0.9 % IV SOLN
50.0000 ug/h | INTRAVENOUS | Status: DC
  Administered 2014-11-23 – 2014-11-24 (×2): 50 ug/h via INTRAVENOUS
  Filled 2014-11-23 (×6): qty 1

## 2014-11-23 MED ORDER — SODIUM CHLORIDE 0.9 % IV SOLN
8.0000 mg/h | INTRAVENOUS | Status: DC
  Administered 2014-11-23 – 2014-11-24 (×2): 8 mg/h via INTRAVENOUS
  Filled 2014-11-23 (×2): qty 80

## 2014-11-23 MED ORDER — NITROGLYCERIN 0.4 MG SL SUBL: 0.4000 mg | SUBLINGUAL_TABLET | SUBLINGUAL | Status: DC | PRN

## 2014-11-23 MED ORDER — IOHEXOL 350 MG/ML SOLN
100.0000 mL | Freq: Once | INTRAVENOUS | Status: AC | PRN
  Administered 2014-11-23: 100 mL via INTRAVENOUS

## 2014-11-23 MED ORDER — CARVEDILOL 3.125 MG PO TABS
3.1250 mg | ORAL_TABLET | Freq: Two times a day (BID) | ORAL | Status: DC
  Administered 2014-11-23 – 2014-11-25 (×4): 3.125 mg via ORAL
  Filled 2014-11-23 (×4): qty 1

## 2014-11-23 MED ORDER — ONDANSETRON HCL 4 MG/2ML IJ SOLN: 4.0000 mg | Freq: Four times a day (QID) | INTRAMUSCULAR | Status: DC | PRN

## 2014-11-23 MED ORDER — SODIUM CHLORIDE 0.9 % IV SOLN
80.0000 mg | Freq: Once | INTRAVENOUS | Status: AC
  Administered 2014-11-23: 80 mg via INTRAVENOUS
  Filled 2014-11-23: qty 80

## 2014-11-23 NOTE — ED Notes (Addendum)
Patient with bleeding per rectum that started this am. No obvious distress at this time. States that the bleeding has been continuous since it started; currently wearing a brief. Also reports upper abdominal pain. Patient takes ASA and plavix regularly.
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
## 2014-11-23 NOTE — ED Provider Notes (Signed)
CSN: [id number]     Arrival date & time 11/23/14  1225 History   First MD Initiated Contact with Patient 11/23/14 1345     Chief Complaint  Patient presents with  . GI Bleeding     (Consider location/radiation/quality/duration/timing/severity/associated sxs/prior Treatment) The history is provided by the patient.  Donald [name] is a 59 y.o. male history of CAD on aspirin and Plavix, hypertension, recurrent C. Difficile requiring stool transplant several months ago here presenting with blood in stool as well as loose stool. Red blood per rectum since earlier today. Has already 3 episodes with some clot. He noticed brown stool but that is loose with blood around it. Denies any fevers or chills. Never had history of GI bleeding and does not drink alcohol use any drugs. Patient is not on any other anticoagulants. Has some lower abdominal pain associated with it.    Past Medical History  Diagnosis Date  . CHF (congestive heart failure)    No past surgical history on file. No family history on file. History  Substance Use Topics  . Smoking status: Not on file  . Smokeless tobacco: Not on file  . Alcohol Use: Not on file    Review of Systems  Gastrointestinal: Positive for abdominal pain and blood in stool.  All other systems reviewed and are negative.     Allergies  Penicillins; Sulfa antibiotics; and Tramadol  Home Medications   Prior to Admission medications   Medication Sig Start Date End Date Taking? Authorizing Provider  acetaminophen (TYLENOL) 325 MG tablet Take 650 mg by mouth every 4 (four) hours as needed for mild pain.   Yes Historical Provider, MD  aspirin EC 81 MG tablet Take 81 mg by mouth daily.   Yes Historical Provider, MD  carvedilol (COREG) 3.125 MG tablet Take 3.125 mg by mouth 2 (two) times daily with a meal.   Yes Historical Provider, MD  clopidogrel (PLAVIX) 75 MG tablet Take 75 mg by mouth daily.   Yes Historical Provider, MD  colestipol (COLESTID) 1  G tablet Take 2 g by mouth 2 (two) times daily.   Yes Historical Provider, MD  Dent[employer] Products CREA 1 Dose by Does not apply route as needed.   Yes Historical Provider, MD  furosemide (LASIX) 20 MG tablet Take 20 mg by mouth daily.   Yes Historical Provider, MD  lisinopril (PRINIVIL,ZESTRIL) 10 MG tablet Take 10 mg by mouth daily.   Yes Historical Provider, MD  Multiple Vitamins-Minerals (MULTIVITAMIN ADULT PO) Take 1 tablet by mouth daily.   Yes Historical Provider, MD  niacin 250 MG tablet Take 250 mg by mouth daily with breakfast.   Yes Historical Provider, MD  nitroGLYCERIN (NITROSTAT) 0.4 MG SL tablet Place 0.4 mg under the tongue every 5 (five) minutes as needed for chest pain. May take up to 3 doses.   Yes Historical Provider, MD  oxyCODONE (OXY IR/ROXICODONE) 5 MG immediate release tablet Take 5 mg by mouth every 4 (four) hours.   Yes Historical Provider, MD  pantoprazole (PROTONIX) 40 MG tablet Take 40 mg by mouth daily.   Yes Historical Provider, MD  potassium chloride SA (K-DUR,KLOR-CON) 20 MEQ tablet Take 30 mEq by mouth daily. Pt takes 1 and 1/2 tablets by mouth daily.   Yes Historical Provider, MD  simvastatin (ZOCOR) 20 MG tablet Take 20 mg by mouth at bedtime.   Yes Historical Provider, MD   BP 112/70 mmHg  Pulse 86  Temp(Src) 97.7 F (36.5 C) (Oral)  Resp 18  Ht 5\' 3"  (1.6 m)  Wt 141 lb (63.957 kg)  BMI 24.98 kg/m2  SpO2 99% Physical Exam  Constitutional: He is oriented to person, place, and time.  Dehydrated   HENT:  Head: Normocephalic.  MM dry   Eyes: Conjunctivae are normal. Pupils are equal, round, and reactive to light.  Neck: Normal range of motion. Neck supple.  Cardiovascular: Normal rate, regular rhythm and normal heart sounds.   Pulmonary/Chest: Effort normal and breath sounds normal. No respiratory distress. He has no wheezes. He has no rales.  Abdominal: Soft.  Mild diffuse tenderness, worse in RLQ and LLQ   Genitourinary:  Rectal- loose stool,  bright red blood   Musculoskeletal: Normal range of motion. He exhibits no edema or tenderness.  Neurological: He is alert and oriented to person, place, and time.  Skin: Skin is warm and dry.  Psychiatric: He has a normal mood and affect. His behavior is normal. Judgment and thought content normal.  Nursing note and vitals reviewed.   ED Course  Procedures (including critical care time) Labs Review Labs Reviewed  CBC - Abnormal; Notable for the following:    WBC 13.1 (*)    RBC 4.23 (*)    Hemoglobin 12.2 (*)    HCT 37.0 (*)    All other components within normal limits  COMPREHENSIVE METABOLIC PANEL - Abnormal; Notable for the following:    Glucose, Bld 104 (*)    BUN 31 (*)    Creatinine, Ser 1.49 (*)    ALT 14 (*)    Alkaline Phosphatase 18 (*)    GFR calc non Af Amer 50 (*)    GFR calc Af Amer 58 (*)    All other components within normal limits  CLOSTRIDIUM DIFFICILE BY PCR (NOT AT [facility])  TYPE AND SCREEN  ABO/RH    Imaging Review No results found.   EKG Interpretation None      MDM   Final diagnoses:  None    Donald [name] is a 59 y.o. male here with GI bleed. Likely rapid upper GI bleed vs diverticular vs colitis from C diff. Will get labs, C diff, CT angio.   3:01 PM Hg 12. Cr 1.5, baseline 0.8. BUN 31. Occ positive with blood red blood. Medicine to admit.    Wandra Arthurs, MD 11/23/14 [phone]

## 2014-11-24 LAB — CBC
HCT: 33.9 % — ABNORMAL LOW (ref 40.0–52.0)
HCT: 35.1 % — ABNORMAL LOW (ref 40.0–52.0)
HEMATOCRIT: 33.5 % — AB (ref 40.0–52.0)
Hemoglobin: 11.2 g/dL — ABNORMAL LOW (ref 13.0–18.0)
Hemoglobin: 11.2 g/dL — ABNORMAL LOW (ref 13.0–18.0)
Hemoglobin: 11.6 g/dL — ABNORMAL LOW (ref 13.0–18.0)
MCH: 29.2 pg (ref 26.0–34.0)
MCH: 29 pg (ref 26.0–34.0)
MCH: 29 pg (ref 26.0–34.0)
MCHC: 33.5 g/dL (ref 32.0–36.0)
MCHC: 33.1 g/dL (ref 32.0–36.0)
MCHC: 33 g/dL (ref 32.0–36.0)
MCV: 87.2 fL (ref 80.0–100.0)
MCV: 87.6 fL (ref 80.0–100.0)
MCV: 87.8 fL (ref 80.0–100.0)
Platelets: 231 10*3/uL (ref 150–440)
Platelets: 228 10*3/uL (ref 150–440)
Platelets: 247 10*3/uL (ref 150–440)
RBC: 3.84 MIL/uL — ABNORMAL LOW (ref 4.40–5.90)
RBC: 3.88 MIL/uL — ABNORMAL LOW (ref 4.40–5.90)
RBC: 4 MIL/uL — ABNORMAL LOW (ref 4.40–5.90)
RDW: 13.2 % (ref 11.5–14.5)
RDW: 13.4 % (ref 11.5–14.5)
RDW: 13.3 % (ref 11.5–14.5)
WBC: 6.2 10*3/uL (ref 3.8–10.6)
WBC: 6.1 10*3/uL (ref 3.8–10.6)
WBC: 5.8 10*3/uL (ref 3.8–10.6)

## 2014-11-24 LAB — GLUCOSE, CAPILLARY: Glucose-Capillary: 98 mg/dL (ref 65–99)

## 2014-11-24 MED ORDER — PANTOPRAZOLE SODIUM 40 MG IV SOLR
40.0000 mg | Freq: Two times a day (BID) | INTRAVENOUS | Status: DC
  Administered 2014-11-24 (×2): 40 mg via INTRAVENOUS
  Filled 2014-11-24 (×3): qty 40

## 2014-11-24 MED ORDER — SODIUM CHLORIDE 0.9 % IV SOLN
INTRAVENOUS | Status: DC
  Administered 2014-11-24: 1000 mL via INTRAVENOUS
  Administered 2014-11-24 – 2014-11-25 (×2): via INTRAVENOUS

## 2014-11-24 MED ORDER — SODIUM CHLORIDE 0.9 % IV BOLUS (SEPSIS)
250.0000 mL | Freq: Once | INTRAVENOUS | Status: AC
  Administered 2014-11-24: 250 mL via INTRAVENOUS

## 2014-11-24 NOTE — Progress Notes (Signed)
[place] [facility] [place] NAME: Donald [name]    MR#:  [id number]  DATE OF BIRTH:  01-19-1956  SUBJECTIVE:  CHIEF COMPLAINT:   Chief Complaint  Patient presents with  . GI Bleeding   admitted for bloody stools and also lower abdominal pain. No further bleeding in the hospital. Hemoglobin remained stable. Remained hypotensive overnight requiring fluid boluses. No use of any pressors. Still has lower abdominal pain.  REVIEW OF SYSTEMS:  Review of Systems  Constitutional: Negative for fever and chills.  Respiratory: Negative for cough, shortness of breath and wheezing.   Cardiovascular: Negative for chest pain and palpitations.  Gastrointestinal: Positive for abdominal pain and blood in stool. Negative for nausea, vomiting, diarrhea and constipation.  Genitourinary: Negative for dysuria.  Neurological: Negative for dizziness, seizures and headaches.    DRUG ALLERGIES:   Allergies  Allergen Reactions  . Penicillins Shortness Of Breath and Rash  . Sulfa Antibiotics Hives, Shortness Of Breath and Rash  . Tramadol Nausea And Vomiting    VITALS:  Blood pressure 109/62, pulse 59, temperature 98.4 F (36.9 C), temperature source Oral, resp. rate 17, height 5\' 3"  (1.6 m), weight 63.957 kg (141 lb), SpO2 97 %.  PHYSICAL EXAMINATION:  Physical Exam  GENERAL:  59 y.o.-year-old patient lying in the bed with no acute distress. Appears older than stated age. EYES: Pupils equal, round, reactive to light and accommodation. No scleral icterus. Extraocular muscles intact.  HEENT: Head atraumatic, normocephalic. Oropharynx and nasopharynx clear.  NECK:  Supple, no jugular venous distention. No thyroid enlargement, no tenderness.  LUNGS: Normal breath sounds bilaterally, no wheezing, rales,rhonchi or crepitation. No use of accessory muscles of respiration.  CARDIOVASCULAR: S1, S2 normal. No murmurs, rubs, or gallops.  ABDOMEN: Soft, tender in the  lower quadrant, hypogastric region. No guarding or rigidity. nondistended. Bowel sounds present. No organomegaly or mass.  EXTREMITIES: No pedal edema, cyanosis, or clubbing.  NEUROLOGIC: Cranial nerves II through XII are intact. Muscle strength 5/5 in all extremities. Sensation intact. Gait not checked.  PSYCHIATRIC: The patient is alert and oriented x 3.  SKIN: No obvious rash, lesion, or ulcer.    LABORATORY PANEL:   CBC  Recent Labs Lab 11/24/14 0922  WBC 5.8  HGB 11.6*  HCT 35.1*  PLT 247   ------------------------------------------------------------------------------------------------------------------  Chemistries   Recent Labs Lab 11/23/14 1245  NA 138  K 4.6  CL 102  CO2 27  GLUCOSE 104*  BUN 31*  CREATININE 1.49*  CALCIUM 9.4  AST 21  ALT 14*  ALKPHOS 18*  BILITOT 0.7   ------------------------------------------------------------------------------------------------------------------  Cardiac Enzymes No results for input(s): TROPONINI in the last 168 hours. ------------------------------------------------------------------------------------------------------------------  RADIOLOGY:  Ct Cta Abd/pel W/cm &/or W/o Cm  11/23/2014   CLINICAL DATA:  Abdominal pain, history of C difficile infection with blood in stool  EXAM: CTA ABDOMEN AND PELVIS wITHOUT AND WITH CONTRAST  TECHNIQUE: Multidetector CT imaging of the abdomen and pelvis was performed using the standard protocol during bolus administration of intravenous contrast. Multiplanar reconstructed images and MIPs were obtained and reviewed to evaluate the vascular anatomy.  CONTRAST:  141mL OMNIPAQUE IOHEXOL 350 MG/ML SOLN  COMPARISON:  None.  FINDINGS: Lung bases are free of acute infiltrate or sizable effusion.  The gallbladder has been surgically removed. The liver, spleen, adrenal glands and pancreas are within normal limits with the exception of some mild fatty infiltration of the liver. Kidneys  demonstrates some mild cortical thinning although no  renal calculi or obstructive changes are seen. Delayed images demonstrate no acute abnormality.  The appendix is not well visualized. The bladder is well distended with non-opacified urine. No pelvic mass lesion or sidewall abnormality is noted. Changes of prior hernia repair are noted in the left hemipelvis. Mild diverticulosis is noted without evidence of diverticulitis. No focal mass lesion is noted within the colon.  The abdominal aorta demonstrates atherosclerotic calcifications without aneurysmal dilatation. The celiac axis, superior mesenteric artery and inferior mesenteric artery are patent with mild atherosclerotic disease at their origins without significant stenosis. Mild atherosclerotic plaque is noted at the origin of the right renal artery without focal hemodynamically significant stenosis. Delayed venous imaging demonstrates no specific mesenteric venous abnormality. No varices are seen.  Review of the MIP images confirms the above findings.  IMPRESSION: Mild atherosclerotic disease in the origins of the mesenteric vessels although no focal hemodynamically significant stenosis is noted.  Diverticulosis without diverticulitis.  No acute abnormality is noted.   Electronically Signed   By: Inez Catalina M.D.   On: 11/23/2014 15:13    EKG:  No orders found for this or any previous visit.  ASSESSMENT AND PLAN:   59 year old male with past medical history significant for congestive heart failure, history of C. difficile colitis status post fecal transplant done in April 2016, history of stroke with resultant dysphasia presents to the hospital secondary to bloody stools.  #1 GI bleed-likely seems to be lower GI bleeding. CT of the abdomen showing diverticulosis without any diverticulitis. GI consult is pending. No evidence of any upper GI bleed. Discontinue octreotide and protonix drips. Change Protonix to twice a day. Start on clear liquid  diet. If pain does not improve, we'll start Cipro and Flagyl to cover for any inflammation in the gut. Hemoglobin has remained stable. No indication for any transfusion. Has not required any pressors.  Just had the colonic evaluation done in April 2016 for his fecal transplant for treatment of C. difficile. Only internal hemorrhoids present at the time. Aspirin and Plavix on hold at this time.  #2 hypertension-blood pressure is low normal. Requiring fluid boluses. Hold his lisinopril and Lasix.  #3 cerebral aneurysm-following with Dr. Melrose Nakayama as outpatient. Hold aspirin and Plavix at this time due to his GI bleed.  #4 history of C. difficile colitis-follows with Dr. Vira Agar. Received stool transplant in April 2016 which was successful. No current diarrhea.  #5 congestive heart failure-denies any coronary artery disease or stents in the heart. Not sure what his EF is. At this time he is hypotensive and hypovolemic. So we'll give fluids cautiously. Continue to monitor at this time. Hold his Lasix. We'll start his home medications tomorrow.   All the records are reviewed and case discussed with Care Management/Social Workerr. Management plans discussed with the patient, family and they are in agreement.  CODE STATUS: Full code  TOTAL CRITICAL CARE TIME TAKING CARE OF THIS PATIENT: 42 minutes.   POSSIBLE D/C IN 2 DAYS, DEPENDING ON CLINICAL CONDITION.   Gladstone Lighter M.D on 11/24/2014 at 11:02 AM  Between 7am to 6pm - Pager - [phone]  After 6pm go to [email or web address] - password [email or web address] [facility]  Office  [phone]  CC: Primary care physician; Sharyne Peach, MD
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
## 2014-11-24 NOTE — Progress Notes (Addendum)
Initial Nutrition Assessment  DOCUMENTATION CODES:     INTERVENTION:     NUTRITION DIAGNOSIS:  Inadequate oral intake related to altered GI function as evidenced by NPO status.    GOAL:   (Goal would be for diet to be progressed within 5-7 days)    MONITOR:   (Energy intake, Digestive system)  REASON FOR ASSESSMENT:  Malnutrition Screening Tool    ASSESSMENT:  Pt admitted with GI bleed possible diverticular bleed. GI to evaluate. Fecal transplant about 3 months ago Past Medical History  Diagnosis Date  . CHF (congestive heart failure)   . Coronary artery disease   . Stroke   . Clostridium difficile colitis December 2015    Presented with sepsis, required stool transplant  . Cerebral aneurysm without rupture     Followed by Dr. Melrose Nakayama, on Plavix  . GERD (gastroesophageal reflux disease)   . Hypothyroidism    Pt reports eating well prior to admission. Typically eats good breakfast eggs, sausage, pancakes, sandwich or soup for lunch and meat and veggies for dinner.    Height:  Ht Readings from Last 1 Encounters:  11/23/14 5\' 3"  (1.6 m)    Weight: Pt reports wt loss of 45 pounds over about 4 months during the time pt had c-diff but pt has gained over 10 pounds of that weight back recently as eating well   Wt Readings from Last 1 Encounters:  11/23/14 141 lb (63.957 kg)     Wt Readings from Last 10 Encounters:  11/23/14 141 lb (63.957 kg)   Electrolyte and Renal Profile:    Recent Labs Lab 11/23/14 1245  BUN 31*  CREATININE 1.49*  NA 138  K 4.6   CBC Latest Ref Rng 11/24/2014 11/24/2014 11/24/2014  WBC 3.8 - 10.6 K/uL 5.8 6.1 6.2  Hemoglobin 13.0 - 18.0 g/dL 11.6(L) 11.2(L) 11.2(L)  Hematocrit 40.0 - 52.0 % 35.1(L) 33.9(L) 33.5(L)  Platelets 150 - 440 K/uL 247 228 231    Medications: protonix, octreotide  Nutrition-Focused physical exam completed. Findings are WDL for fat depletion, muscle depletion, and edema.    BMI:  Body mass index is  24.98 kg/(m^2).  Estimated Nutritional Needs:  Kcal:  BEE 1350 kcals (IF 1.0-1.2, AF 1.3) 1755-2106 kcals/d.   Protein:  (1.0-1.2 g/d) 64-77 g/d  Fluid:  (25-46ml/kg) 1600-1915ml/d  Skin:  Reviewed, no issues  Diet Order:  Diet NPO time specified  EDUCATION NEEDS:  No education needs identified at this time   Intake/Output Summary (Last 24 hours) at 11/24/14 1056 Last data filed at 11/24/14 0900  Gross per 24 hour  Intake 679.16 ml  Output    250 ml  Net 429.16 ml    Last BM:  5/26  MODERATE Care Level Ia Donald [name], [place], [place] (pager)

## 2014-11-24 NOTE — Progress Notes (Signed)
Notified Dr. Reece Levy about Pt's low BP (82/46). Orders for a 266ml NS bolus were given.

## 2014-11-24 NOTE — Progress Notes (Signed)
Alert and oriented.  No c/o pain.  Refused 1400 oxy 5mg .  No stools.  Tolerating clear liquids.  Advanced to full liquids.  Seen by Dr Dede Query  And Dr Rayann Heman. H&H stable. C-diff contact precautions continue prophylactic. No stool to send so far. States he  had fecal transplant a few months ago by Dr Tiffany Kocher. and no problem since. Report called to nurse for rm 217. Prepare for transfer.

## 2014-11-25 LAB — CBC
HCT: 36.6 % — ABNORMAL LOW (ref 40.0–52.0)
Hemoglobin: 12.2 g/dL — ABNORMAL LOW (ref 13.0–18.0)
MCH: 29.3 pg (ref 26.0–34.0)
MCHC: 33.4 g/dL (ref 32.0–36.0)
MCV: 87.7 fL (ref 80.0–100.0)
PLATELETS: 266 10*3/uL (ref 150–440)
RBC: 4.17 MIL/uL — ABNORMAL LOW (ref 4.40–5.90)
RDW: 13.2 % (ref 11.5–14.5)
WBC: 8.6 10*3/uL (ref 3.8–10.6)

## 2014-11-25 NOTE — Progress Notes (Signed)
[facility] [place] NAME: Donald [name]    MR#:  [id number]  DATE OF BIRTH:  1955/12/22  SUBJECTIVE:  CHIEF COMPLAINT:   Chief Complaint  Patient presents with  . GI Bleeding  Doing very well today. No further bleeding or bowel movements. Abdominal pain has improved. Hb stable. discharge today   REVIEW OF SYSTEMS:  Review of Systems  Constitutional: Negative for fever and chills.  Respiratory: Negative for cough, shortness of breath and wheezing.   Cardiovascular: Negative for chest pain and palpitations.  Gastrointestinal: Negative for nausea, vomiting, abdominal pain, diarrhea and constipation.  Genitourinary: Negative for dysuria.  Neurological: Negative for dizziness, seizures and headaches.    DRUG ALLERGIES:   Allergies  Allergen Reactions  . Penicillins Shortness Of Breath and Rash  . Sulfa Antibiotics Hives, Shortness Of Breath and Rash  . Tramadol Nausea And Vomiting    VITALS:  Blood pressure 169/69, pulse 61, temperature 97.8 F (36.6 C), temperature source Oral, resp. rate 17, height 5\' 3"  (1.6 m), weight 63.957 kg (141 lb), SpO2 99 %.  PHYSICAL EXAMINATION:  Physical Exam  GENERAL:  59 y.o.-year-old patient lying in the bed with no acute distress. Appears older than stated age. EYES: Pupils equal, round, reactive to light and accommodation. No scleral icterus. Extraocular muscles intact.  HEENT: Head atraumatic, normocephalic. Oropharynx and nasopharynx clear.  NECK:  Supple, no jugular venous distention. No thyroid enlargement, no tenderness.  LUNGS: Normal breath sounds bilaterally, no wheezing, rales,rhonchi or crepitation. No use of accessory muscles of respiration.  CARDIOVASCULAR: S1, S2 normal. No murmurs, rubs, or gallops.  ABDOMEN: Soft, tender in the lower quadrant, hypogastric region. No guarding or rigidity. nondistended. Bowel sounds present. No organomegaly or mass.  EXTREMITIES: No pedal  edema, cyanosis, or clubbing.  NEUROLOGIC: Cranial nerves II through XII are intact. Muscle strength 5/5 in all extremities. Sensation intact. Gait not checked.  PSYCHIATRIC: The patient is alert and oriented x 3.  SKIN: No obvious rash, lesion, or ulcer.    LABORATORY PANEL:   CBC  Recent Labs Lab 11/25/14 0758  WBC 8.6  HGB 12.2*  HCT 36.6*  PLT 266   ------------------------------------------------------------------------------------------------------------------  Chemistries   Recent Labs Lab 11/23/14 1245  NA 138  K 4.6  CL 102  CO2 27  GLUCOSE 104*  BUN 31*  CREATININE 1.49*  CALCIUM 9.4  AST 21  ALT 14*  ALKPHOS 18*  BILITOT 0.7   ------------------------------------------------------------------------------------------------------------------  Cardiac Enzymes No results for input(s): TROPONINI in the last 168 hours. ------------------------------------------------------------------------------------------------------------------  RADIOLOGY:  Ct Cta Abd/pel W/cm &/or W/o Cm  11/23/2014   CLINICAL DATA:  Abdominal pain, history of C difficile infection with blood in stool  EXAM: CTA ABDOMEN AND PELVIS wITHOUT AND WITH CONTRAST  TECHNIQUE: Multidetector CT imaging of the abdomen and pelvis was performed using the standard protocol during bolus administration of intravenous contrast. Multiplanar reconstructed images and MIPs were obtained and reviewed to evaluate the vascular anatomy.  CONTRAST:  174mL OMNIPAQUE IOHEXOL 350 MG/ML SOLN  COMPARISON:  None.  FINDINGS: Lung bases are free of acute infiltrate or sizable effusion.  The gallbladder has been surgically removed. The liver, spleen, adrenal glands and pancreas are within normal limits with the exception of some mild fatty infiltration of the liver. Kidneys demonstrates some mild cortical thinning although no renal calculi or obstructive changes are seen. Delayed images demonstrate no acute abnormality.  The  appendix is not well visualized. The bladder  is well distended with non-opacified urine. No pelvic mass lesion or sidewall abnormality is noted. Changes of prior hernia repair are noted in the left hemipelvis. Mild diverticulosis is noted without evidence of diverticulitis. No focal mass lesion is noted within the colon.  The abdominal aorta demonstrates atherosclerotic calcifications without aneurysmal dilatation. The celiac axis, superior mesenteric artery and inferior mesenteric artery are patent with mild atherosclerotic disease at their origins without significant stenosis. Mild atherosclerotic plaque is noted at the origin of the right renal artery without focal hemodynamically significant stenosis. Delayed venous imaging demonstrates no specific mesenteric venous abnormality. No varices are seen.  Review of the MIP images confirms the above findings.  IMPRESSION: Mild atherosclerotic disease in the origins of the mesenteric vessels although no focal hemodynamically significant stenosis is noted.  Diverticulosis without diverticulitis.  No acute abnormality is noted.   Electronically Signed   By: Inez Catalina M.D.   On: 11/23/2014 15:13    EKG:  No orders found for this or any previous visit.  ASSESSMENT AND PLAN:   59 year old male with past medical history significant for congestive heart failure, history of C. difficile colitis status post fecal transplant done in April 2016, history of stroke with resultant dysphasia presents to the hospital secondary to bloody stools.  #1 GI bleed-likely seems to be lower GI bleeding. CT of the abdomen showing diverticulosis without any diverticulitis. GI consult is appreciated Likely diverticular or internal hemorrhoidal bleeding. On Protonix to twice a day. Tolerating soft diet well. Hb stable  Just had the colonic evaluation done in April 2016 for his fecal transplant for treatment of C. difficile. Only internal hemorrhoids present at the time. Aspirin  and Plavix on hold at this time.  #2 hypertension-blood pressure is low normal. BP improved, restart home meds- lisinopril, lasix at discharge  #3 cerebral aneurysm-following with Dr. Melrose Nakayama as outpatient. Restart aspirin and Plavix at discharge  #4 history of C. difficile colitis-follows with Dr. Vira Agar. Received stool transplant in April 2016 which was successful. No current diarrhea.  #5 congestive heart failure-denies any coronary artery disease or stents in the heart. Not sure what his EF is. Restart home meds- lasix at discharge   All the records are reviewed and case discussed with Care Management/Social Workerr. Management plans discussed with the patient, family and they are in agreement.  CODE STATUS: Full code  TOTAL TIME TAKING CARE OF THIS PATIENT: 42 minutes.   POSSIBLE D/C TODAY, DEPENDING ON CLINICAL CONDITION.   Gladstone Lighter M.D on 11/25/2014 at 9:31 AM  Between 7am to 6pm - Pager - [phone]  After 6pm go to [email or web address] - password [email or web address] [facility]  Office  [phone]  CC: Primary care physician; Sharyne Peach, MD

## 2014-11-25 NOTE — Discharge Instructions (Signed)

## 2014-11-25 NOTE — Progress Notes (Signed)
Pt A and O x 4. VSS. Pt voiding with no difficulties. No BM this shift. Pt ambulating in room. Pt had no complaints of pain or nausea. Pt had IVs removed. Pt had discharge instructions given and voiced that he understood discharge instructions with no further questions. Pt left via wheelchair with nurse aide.

## 2014-11-25 NOTE — Discharge Summary (Signed)
[facility] [place] NAME: Donald [name]    MR#:  [id number]  DATE OF BIRTH:  08/12/1955  DATE OF ADMISSION:  11/23/2014 ADMITTING PHYSICIAN: Aldean Jewett, MD  DATE OF DISCHARGE: 11/25/14  PRIMARY CARE PHYSICIAN: Sharyne Peach, MD    ADMISSION DIAGNOSIS:  Gastrointestinal hemorrhage associated with intestinal diverticulosis [K57.91]  DISCHARGE DIAGNOSIS:  Active Problems:   GI bleed   Diverticulosis   Coronary artery disease   Cerebral aneurysm   SECONDARY DIAGNOSIS:   Past Medical History  Diagnosis Date  . CHF (congestive heart failure)   . Coronary artery disease   . Stroke   . Clostridium difficile colitis December 2015    Presented with sepsis, required stool transplant  . Cerebral aneurysm without rupture     Followed by Dr. Melrose Nakayama, on Plavix  . GERD (gastroesophageal reflux disease)   . Hypothyroidism     HOSPITAL COURSE:   59 year old male with past medical history significant for congestive heart failure, history of C. difficile colitis status post fecal transplant done in April 2016, history of stroke with resultant dysphasia presents to the hospital secondary to bloody stools.  #1 GI bleed-likely seems to be lower GI bleeding. CT of the abdomen showing diverticulosis without any diverticulitis. GI consult is appreciated Likely diverticular or internal hemorrhoidal bleeding. On Protonix. Tolerating soft diet well. Hb stable Just had the colonic evaluation done in April 2016 for his fecal transplant for treatment of C. difficile. Only internal hemorrhoids present at the time. Aspirin and Plavix on hold at this time. Will be restarted at discharge.  #2 hypertension-blood pressure is low normal on admission. BP improved, restart home meds- lisinopril, coreg, lasix at discharge  #3 cerebral aneurysm-following with Dr. Melrose Nakayama as outpatient. Restart aspirin and Plavix at discharge  #4 history of C.  difficile colitis-follows with Dr. Vira Agar. Received stool transplant in April 2016 which was successful. No current diarrhea.  #5 congestive heart failure-denies any coronary artery disease or stents in the heart. Not sure what his EF is. Restart home meds- lasix, coreg, lisinopril at discharge  DISCHARGE CONDITIONS:   Stable  CONSULTS OBTAINED:  Treatment Team:  Josefine Class, MD  DRUG ALLERGIES:   Allergies  Allergen Reactions  . Penicillins Shortness Of Breath and Rash  . Sulfa Antibiotics Hives, Shortness Of Breath and Rash  . Tramadol Nausea And Vomiting    DISCHARGE MEDICATIONS:   Current Discharge Medication List    CONTINUE these medications which have NOT CHANGED   Details  acetaminophen (TYLENOL) 325 MG tablet Take 650 mg by mouth every 4 (four) hours as needed for mild pain.    aspirin EC 81 MG tablet Take 81 mg by mouth daily.    carvedilol (COREG) 3.125 MG tablet Take 3.125 mg by mouth 2 (two) times daily with a meal.    clopidogrel (PLAVIX) 75 MG tablet Take 75 mg by mouth daily.    colestipol (COLESTID) 1 G tablet Take 2 g by mouth 2 (two) times daily.    D[employer] Products CREA 1 Dose by Does not apply route as needed.    furosemide (LASIX) 20 MG tablet Take 20 mg by mouth daily.    lisinopril (PRINIVIL,ZESTRIL) 10 MG tablet Take 10 mg by mouth daily.    Multiple Vitamins-Minerals (MULTIVITAMIN ADULT PO) Take 1 tablet by mouth daily.    niacin 250 MG tablet Take 250 mg by mouth daily with breakfast.    nitroGLYCERIN (  NITROSTAT) 0.4 MG SL tablet Place 0.4 mg under the tongue every 5 (five) minutes as needed for chest pain. May take up to 3 doses.    oxyCODONE (OXY IR/ROXICODONE) 5 MG immediate release tablet Take 5 mg by mouth every 4 (four) hours.    pantoprazole (PROTONIX) 40 MG tablet Take 40 mg by mouth daily.    potassium chloride SA (K-DUR,KLOR-CON) 20 MEQ tablet Take 30 mEq by mouth daily. Pt takes 1 and 1/2 tablets by mouth daily.     simvastatin (ZOCOR) 20 MG tablet Take 20 mg by mouth at bedtime.         DISCHARGE INSTRUCTIONS:    1. PCP f/u in 1-2 weeks 2. GI f/u with Dr. Tiffany Kocher in 2 weeks  If you experience worsening of your admission symptoms, develop shortness of breath, life threatening emergency, suicidal or homicidal thoughts you must seek medical attention immediately by calling 911 or calling your MD immediately  if symptoms less severe.  You Must read complete instructions/literature along with all the possible adverse reactions/side effects for all the Medicines you take and that have been prescribed to you. Take any new Medicines after you have completely understood and accept all the possible adverse reactions/side effects.   Please note  You were cared for by a hospitalist during your hospital stay. If you have any questions about your discharge medications or the care you received while you were in the hospital after you are discharged, you can call the unit and asked to speak with the hospitalist on call if the hospitalist that took care of you is not available. Once you are discharged, your primary care physician will handle any further medical issues. Please note that NO REFILLS for any discharge medications will be authorized once you are discharged, as it is imperative that you return to your primary care physician (or establish a relationship with a primary care physician if you do not have one) for your aftercare needs so that they can reassess your need for medications and monitor your lab values.    Today   CHIEF COMPLAINT:   Chief Complaint  Patient presents with  . GI Bleeding    VITAL SIGNS:  Blood pressure 169/69, pulse 61, temperature 97.8 F (36.6 C), temperature source Oral, resp. rate 17, height 5\' 3"  (1.6 m), weight 63.957 kg (141 lb), SpO2 99 %.  I/O:   Intake/Output Summary (Last 24 hours) at 11/25/14 0936 Last data filed at 11/25/14 0900  Gross per 24 hour  Intake  1994.66 ml  Output   1800 ml  Net 194.66 ml    PHYSICAL EXAMINATION:   Physical Exam  GENERAL: 59 y.o.-year-old patient lying in the bed with no acute distress. Appears older than stated age. EYES: Pupils equal, round, reactive to light and accommodation. No scleral icterus. Extraocular muscles intact.  HEENT: Head atraumatic, normocephalic. Oropharynx and nasopharynx clear.  NECK: Supple, no jugular venous distention. No thyroid enlargement, no tenderness.  LUNGS: Normal breath sounds bilaterally, no wheezing, rales,rhonchi or crepitation. No use of accessory muscles of respiration.  CARDIOVASCULAR: S1, S2 normal. No murmurs, rubs, or gallops.  ABDOMEN: Soft, tender in the lower quadrant, hypogastric region. No guarding or rigidity. nondistended. Bowel sounds present. No organomegaly or mass.  EXTREMITIES: No pedal edema, cyanosis, or clubbing.  NEUROLOGIC: Cranial nerves II through XII are intact. Muscle strength 5/5 in all extremities. Sensation intact. Gait not checked.  PSYCHIATRIC: The patient is alert and oriented x 3.  SKIN: No obvious  rash, lesion, or ulcer.   DATA REVIEW:   CBC  Recent Labs Lab 11/25/14 0758  WBC 8.6  HGB 12.2*  HCT 36.6*  PLT 266    Chemistries   Recent Labs Lab 11/23/14 1245  NA 138  K 4.6  CL 102  CO2 27  GLUCOSE 104*  BUN 31*  CREATININE 1.49*  CALCIUM 9.4  AST 21  ALT 14*  ALKPHOS 18*  BILITOT 0.7    Cardiac Enzymes No results for input(s): TROPONINI in the last 168 hours.  Microbiology Results  Results for orders placed or performed in visit on 06/28/14  Culture, blood (single)     Status: None   Collection Time: 06/28/14  8:26 PM  Result Value Ref Range Status   Micro Text Report   Final       COMMENT                   NO GROWTH AEROBICALLY/ANAEROBICALLY IN 5 DAYS   ANTIBIOTIC                                                      Culture, blood (single)     Status: None   Collection Time: 06/28/14 10:09  PM  Result Value Ref Range Status   Micro Text Report   Final       COMMENT                   NO GROWTH AEROBICALLY/ANAEROBICALLY IN 5 DAYS   ANTIBIOTIC                                                      Clostridium Difficile [facility])     Status: None   Collection Time: 06/29/14 11:00 AM  Result Value Ref Range Status   Micro Text Report   Final       C.DIFFICILE ANTIGEN       C.DIFFICILE GDH ANTIGEN : POSITIVE   C.DIFFICILE TOXIN A/B     C.DIFFICILE TOXINS A AND/OR B: POSITIVE   INTERPRETATION            Positive for toxigenic C. difficile, active toxin production present.    ANTIBIOTIC                                                        RADIOLOGY:  Ct Cta Abd/pel W/cm &/or W/o Cm  11/23/2014   CLINICAL DATA:  Abdominal pain, history of C difficile infection with blood in stool  EXAM: CTA ABDOMEN AND PELVIS wITHOUT AND WITH CONTRAST  TECHNIQUE: Multidetector CT imaging of the abdomen and pelvis was performed using the standard protocol during bolus administration of intravenous contrast. Multiplanar reconstructed images and MIPs were obtained and reviewed to evaluate the vascular anatomy.  CONTRAST:  122mL OMNIPAQUE IOHEXOL 350 MG/ML SOLN  COMPARISON:  None.  FINDINGS: Lung bases are free of acute infiltrate or sizable effusion.  The gallbladder has been surgically removed. The liver, spleen, adrenal glands and pancreas  are within normal limits with the exception of some mild fatty infiltration of the liver. Kidneys demonstrates some mild cortical thinning although no renal calculi or obstructive changes are seen. Delayed images demonstrate no acute abnormality.  The appendix is not well visualized. The bladder is well distended with non-opacified urine. No pelvic mass lesion or sidewall abnormality is noted. Changes of prior hernia repair are noted in the left hemipelvis. Mild diverticulosis is noted without evidence of diverticulitis. No focal mass lesion is noted within the colon.   The abdominal aorta demonstrates atherosclerotic calcifications without aneurysmal dilatation. The celiac axis, superior mesenteric artery and inferior mesenteric artery are patent with mild atherosclerotic disease at their origins without significant stenosis. Mild atherosclerotic plaque is noted at the origin of the right renal artery without focal hemodynamically significant stenosis. Delayed venous imaging demonstrates no specific mesenteric venous abnormality. No varices are seen.  Review of the MIP images confirms the above findings.  IMPRESSION: Mild atherosclerotic disease in the origins of the mesenteric vessels although no focal hemodynamically significant stenosis is noted.  Diverticulosis without diverticulitis.  No acute abnormality is noted.   Electronically Signed   By: Inez Catalina M.D.   On: 11/23/2014 15:13    EKG:  No orders found for this or any previous visit.    Management plans discussed with the patient, family and they are in agreement.  CODE STATUS:     Code Status Orders        Start     Ordered   11/23/14 1818  Full code   Continuous     11/23/14 1817      TOTAL TIME TAKING CARE OF THIS PATIENT: 40 minutes.    Gladstone Lighter M.D on 11/25/2014 at 9:36 AM  Between 7am to 6pm - Pager - [phone]  After 6pm go to [email or web address] - password [email or web address] [facility]  Office  [phone]  CC: Primary care physician; Sharyne Peach, MD

## 2015-04-14 ENCOUNTER — Encounter: Payer: Self-pay | Admitting: Emergency Medicine

## 2015-04-14 ENCOUNTER — Emergency Department: Payer: Medicare PPO

## 2015-04-14 ENCOUNTER — Ambulatory Visit
Admission: EM | Admit: 2015-04-14 | Discharge: 2015-04-14 | Disposition: A | Discharge status: Other Acute Inpatient Hospital | Payer: Medicare PPO | Attending: Family Medicine | Admitting: Family Medicine

## 2015-04-14 ENCOUNTER — Encounter: Payer: Self-pay | Admitting: Gynecology

## 2015-04-14 ENCOUNTER — Emergency Department
Admission: EM | Admit: 2015-04-14 | Discharge: 2015-04-14 | Disposition: A | Discharge status: Home / Self Care | Payer: Medicare PPO | Attending: Student | Admitting: Student

## 2015-04-14 DIAGNOSIS — R1030 Lower abdominal pain, unspecified: Secondary | ICD-10-CM

## 2015-04-14 DIAGNOSIS — M79604 Pain in right leg: Secondary | ICD-10-CM | POA: Diagnosis not present

## 2015-04-14 DIAGNOSIS — Z87891 Personal history of nicotine dependence: Secondary | ICD-10-CM | POA: Insufficient documentation

## 2015-04-14 DIAGNOSIS — Z79899 Other long term (current) drug therapy: Secondary | ICD-10-CM | POA: Diagnosis not present

## 2015-04-14 DIAGNOSIS — Z7982 Long term (current) use of aspirin: Secondary | ICD-10-CM | POA: Insufficient documentation

## 2015-04-14 DIAGNOSIS — Z880 Allergy status to penicillin: Secondary | ICD-10-CM | POA: Insufficient documentation

## 2015-04-14 DIAGNOSIS — K4090 Unilateral inguinal hernia, without obstruction or gangrene, not specified as recurrent: Secondary | ICD-10-CM | POA: Insufficient documentation

## 2015-04-14 LAB — URINALYSIS COMPLETE WITH MICROSCOPIC (ARMC ONLY)
Bilirubin Urine: NEGATIVE
Glucose, UA: NEGATIVE mg/dL
Hgb urine dipstick: NEGATIVE
Ketones, ur: NEGATIVE mg/dL
LEUKOCYTES UA: NEGATIVE
Nitrite: NEGATIVE
PROTEIN: NEGATIVE mg/dL
Specific Gravity, Urine: 1.014 (ref 1.005–1.030)
WBC, UA: NONE SEEN WBC/hpf (ref 0–5)
pH: 6 (ref 5.0–8.0)

## 2015-04-14 LAB — COMPREHENSIVE METABOLIC PANEL
ALBUMIN: 3.7 g/dL (ref 3.5–5.0)
ALT: 16 U/L — AB (ref 17–63)
AST: 20 U/L (ref 15–41)
Alkaline Phosphatase: 18 U/L — ABNORMAL LOW (ref 38–126)
Anion gap: 7 (ref 5–15)
BUN: 20 mg/dL (ref 6–20)
CO2: 25 mmol/L (ref 22–32)
CREATININE: 1.23 mg/dL (ref 0.61–1.24)
Calcium: 9.2 mg/dL (ref 8.9–10.3)
Chloride: 105 mmol/L (ref 101–111)
GFR calc Af Amer: >60 mL/min (ref >60)
GFR calc non Af Amer: >60 mL/min (ref >60)
Glucose, Bld: 90 mg/dL (ref 65–99)
POTASSIUM: 4.4 mmol/L (ref 3.5–5.1)
SODIUM: 137 mmol/L (ref 135–145)
Total Bilirubin: 0.7 mg/dL (ref 0.3–1.2)
Total Protein: 6.9 g/dL (ref 6.5–8.1)

## 2015-04-14 LAB — PROTIME-INR
INR: 1.05
Prothrombin Time: 13.9 seconds (ref 11.4–15.0)

## 2015-04-14 LAB — CBC WITH DIFFERENTIAL/PLATELET
BASOS ABS: 0.1 10*3/uL (ref 0–0.1)
BASOS PCT: 1 %
EOS ABS: 0.2 10*3/uL (ref 0–0.7)
EOS PCT: 3 %
HCT: 35.8 % — ABNORMAL LOW (ref 40.0–52.0)
Hemoglobin: 12.2 g/dL — ABNORMAL LOW (ref 13.0–18.0)
Lymphocytes Relative: 28 %
Lymphs Abs: 2.3 10*3/uL (ref 1.0–3.6)
MCH: 30.2 pg (ref 26.0–34.0)
MCHC: 34.1 g/dL (ref 32.0–36.0)
MCV: 88.6 fL (ref 80.0–100.0)
MONO ABS: 0.6 10*3/uL (ref 0.2–1.0)
Monocytes Relative: 8 %
Neutro Abs: 4.9 10*3/uL (ref 1.4–6.5)
Neutrophils Relative %: 60 %
PLATELETS: 223 10*3/uL (ref 150–440)
RBC: 4.05 MIL/uL — AB (ref 4.40–5.90)
RDW: 13.7 % (ref 11.5–14.5)
WBC: 8.1 10*3/uL (ref 3.8–10.6)

## 2015-04-14 LAB — LIPASE, BLOOD: LIPASE: 22 U/L (ref 22–51)

## 2015-04-14 LAB — APTT: APTT: 29 s (ref 24–36)

## 2015-04-14 MED ORDER — KETOROLAC TROMETHAMINE 30 MG/ML IJ SOLN
30.0000 mg | Freq: Once | INTRAMUSCULAR | Status: AC
  Administered 2015-04-14: 30 mg via INTRAVENOUS
  Filled 2015-04-14: qty 1

## 2015-04-14 MED ORDER — ONDANSETRON HCL 4 MG/2ML IJ SOLN
4.0000 mg | Freq: Once | INTRAMUSCULAR | Status: AC
  Administered 2015-04-14: 4 mg via INTRAVENOUS
  Filled 2015-04-14: qty 2

## 2015-04-14 MED ORDER — IOHEXOL 300 MG/ML  SOLN
100.0000 mL | Freq: Once | INTRAMUSCULAR | Status: AC | PRN
  Administered 2015-04-14: 100 mL via INTRAVENOUS

## 2015-04-14 MED ORDER — OXYCODONE HCL 5 MG PO TABS: 5.0000 mg | ORAL_TABLET | Freq: Four times a day (QID) | ORAL | Status: DC | PRN

## 2015-04-14 MED ORDER — IOHEXOL 240 MG/ML SOLN
25.0000 mL | Freq: Once | INTRAMUSCULAR | Status: AC | PRN
  Administered 2015-04-14: 25 mL via ORAL

## 2015-04-14 MED ORDER — MORPHINE SULFATE (PF) 4 MG/ML IV SOLN
4.0000 mg | Freq: Once | INTRAVENOUS | Status: AC
  Administered 2015-04-14: 4 mg via INTRAVENOUS
  Filled 2015-04-14: qty 1
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
## 2015-04-14 NOTE — ED Notes (Signed)
Patient transported to CT 

## 2015-04-14 NOTE — ED Notes (Signed)
Pt has been having lower abdominal pain for 3 weeks that started as indigestion. Today when he got up the pain was much worse and radiating to right groin and right leg. Took 2 Tylenol ES this am and Ice pack but did not help.  Was sent from [facility].

## 2015-04-14 NOTE — ED Notes (Signed)
CT notified patient finished with contrast 

## 2015-04-14 NOTE — ED Notes (Signed)
Patient c/o right leg pain x 3 week then pain went up to  his right lower abdomen. Per patient painful  to walk especially when getting up from sitting.

## 2015-08-23 ENCOUNTER — Other Ambulatory Visit: Payer: Self-pay | Admitting: Neurology

## 2015-08-23 DIAGNOSIS — I671 Cerebral aneurysm, nonruptured: Secondary | ICD-10-CM

## 2015-09-05 ENCOUNTER — Ambulatory Visit: Admission: RE | Admit: 2015-09-05 | Payer: Medicare HMO | Source: Ambulatory Visit

## 2015-09-06 ENCOUNTER — Ambulatory Visit
Admission: RE | Admit: 2015-09-06 | Discharge: 2015-09-06 | Disposition: A | Discharge status: Home / Self Care | Payer: Medicare HMO | Source: Ambulatory Visit | Attending: Neurology | Admitting: Neurology

## 2015-09-06 DIAGNOSIS — I671 Cerebral aneurysm, nonruptured: Secondary | ICD-10-CM | POA: Diagnosis present

## 2015-09-06 DIAGNOSIS — I672 Cerebral atherosclerosis: Secondary | ICD-10-CM | POA: Diagnosis not present

## 2015-09-07 ENCOUNTER — Other Ambulatory Visit: Payer: Self-pay | Admitting: Neurology

## 2015-09-07 DIAGNOSIS — I671 Cerebral aneurysm, nonruptured: Secondary | ICD-10-CM

## 2016-06-05 ENCOUNTER — Telehealth: Payer: Self-pay | Admitting: *Deleted

## 2016-06-05 NOTE — Telephone Encounter (Signed)
Received VM from patient.   Reports that he is following up in regards to new patient paperwork.   Please contact patient at [phone].

## 2016-06-09 NOTE — Telephone Encounter (Signed)
Received VM from patient wife, [name] in regards to new patient paperwork.   Please contact her at [phone]~ telephone.

## 2016-07-22 ENCOUNTER — Ambulatory Visit: Payer: Commercial Managed Care - HMO | Admitting: Family Medicine

## 2016-08-06 ENCOUNTER — Encounter: Payer: Self-pay | Admitting: Family Medicine

## 2016-08-06 ENCOUNTER — Ambulatory Visit (INDEPENDENT_AMBULATORY_CARE_PROVIDER_SITE_OTHER): Payer: Medicare HMO | Admitting: Family Medicine

## 2016-08-06 ENCOUNTER — Telehealth: Payer: Self-pay | Admitting: Family Medicine

## 2016-08-06 VITALS — BP 110/70 | HR 77 | Temp 98.1°F | Ht 63.0 in | Wt 153.0 lb

## 2016-08-06 DIAGNOSIS — I671 Cerebral aneurysm, nonruptured: Secondary | ICD-10-CM

## 2016-08-06 DIAGNOSIS — R109 Unspecified abdominal pain: Secondary | ICD-10-CM | POA: Insufficient documentation

## 2016-08-06 DIAGNOSIS — R1012 Left upper quadrant pain: Secondary | ICD-10-CM

## 2016-08-06 DIAGNOSIS — Z8673 Personal history of transient ischemic attack (TIA), and cerebral infarction without residual deficits: Secondary | ICD-10-CM | POA: Insufficient documentation

## 2016-08-06 DIAGNOSIS — I25110 Atherosclerotic heart disease of native coronary artery with unstable angina pectoris: Secondary | ICD-10-CM | POA: Diagnosis not present

## 2016-08-06 DIAGNOSIS — R51 Headache: Secondary | ICD-10-CM

## 2016-08-06 DIAGNOSIS — G8929 Other chronic pain: Secondary | ICD-10-CM

## 2016-08-06 DIAGNOSIS — R519 Headache, unspecified: Secondary | ICD-10-CM | POA: Insufficient documentation

## 2016-08-06 DIAGNOSIS — N179 Acute kidney failure, unspecified: Secondary | ICD-10-CM

## 2016-08-06 LAB — CBC
HCT: 38.4 % — ABNORMAL LOW (ref 39.0–52.0)
HEMOGLOBIN: 12.6 g/dL — AB (ref 13.0–17.0)
MCH: 28.6 pg (ref 26.0–34.0)
MCHC: 32.8 g/dL (ref 30.0–36.0)
MCV: 87.3 fL (ref 78.0–100.0)
PLATELETS: 254 10*3/uL (ref 150–400)
RBC: 4.4 MIL/uL (ref 4.22–5.81)
RDW: 13.3 % (ref 11.5–15.5)
WBC: 7.8 10*3/uL (ref 4.0–10.5)

## 2016-08-06 LAB — COMPREHENSIVE METABOLIC PANEL
ALT: 16 U/L — AB (ref 17–63)
ANION GAP: 10 (ref 5–15)
AST: 17 U/L (ref 15–41)
Albumin: 4 g/dL (ref 3.5–5.0)
Alkaline Phosphatase: 19 U/L — ABNORMAL LOW (ref 38–126)
BUN: 38 mg/dL — ABNORMAL HIGH (ref 6–20)
CALCIUM: 9.8 mg/dL (ref 8.9–10.3)
CHLORIDE: 102 mmol/L (ref 101–111)
CO2: 26 mmol/L (ref 22–32)
CREATININE: 1.57 mg/dL — AB (ref 0.61–1.24)
GFR calc Af Amer: 53 mL/min — ABNORMAL LOW (ref >60)
GFR, EST NON AFRICAN AMERICAN: 46 mL/min — AB (ref >60)
Glucose, Bld: 89 mg/dL (ref 65–99)
Potassium: 5.9 mmol/L — ABNORMAL HIGH (ref 3.5–5.1)
Sodium: 138 mmol/L (ref 135–145)
Total Bilirubin: 0.7 mg/dL (ref 0.3–1.2)
Total Protein: 6.8 g/dL (ref 6.5–8.1)

## 2016-08-06 LAB — LIPASE, BLOOD: LIPASE: 19 U/L (ref 11–51)

## 2016-08-06 NOTE — Assessment & Plan Note (Addendum)
I am not concerned for acute or surgical abdomen. Do want to pursue imaging based on firmness without discrete mass over LUQ and degree of pain with movement. Differential diagnosis includes diverticulosis, splenomegaly, mass, splenic infarct. Less likely etiologies include pancreatitis, gastritis. Ordered CBC, CMP, Lipase, lactic acid and CT abd pelvis with oral and IV contrast. VSS, afebrile. I am not concerned about sepsis or acute blood loss at present.

## 2016-08-06 NOTE — Progress Notes (Signed)
CC: new patient  HPI Former smoker 2008 - 30 years, 2ppd Alcohol 1 beer per month. When younger, drank a lot of liquor. No withdrawals.  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 Stable living situation at present with partner of 25 years.   2008 - MI x 2 in one day. Echo at that time with hypokinesis per patient report ("part of his heart wasn't moving right"). Started simvasatin, imdur, ASA, lasix, lisinopril, nitroglycerin, spiro during that admission. [name] for cards at [facility], do not want to switch. Really admire this MD and appreciate his care.  2015- 2 strokes in one episode. Added plavix at that time. No afib. Diagnosed cerebral aneurysym. Dr. Melrose Nakayama for neurosurg ([facility]). Plavix continued for this. At last follow up, aneursym reportedly gone. Gabapentin for HAs started around this time. Since then nortriptyline has been added for HAs later.   2016 - C diff x1 year requiring fecal transplant.   Htn - takes BP every day.   Abdominal pain - started 1 month ago. After he lifted something heavy (trunk). Has been trying tylenol (no help), lomitil (somewhat helped), gasx (somewhat). Getting steadily worse over the past month. At rest, dull. Gets sharp when he moves. Hernia was repaired on the same side. Reports mesh in hernia repair. Has a scrotal truss, which he doesn't wear very much.   CC, SH/smoking status, and VS noted  Objective: BP 110/70 (BP Location: Right Arm, Patient Position: Sitting, Cuff Size: Normal)   Pulse 77   Temp 98.1 F (36.7 C) (Oral)   Ht 5\' 3"  (1.6 m)   Wt 153 lb (69.4 kg)   SpO2 99%   BMI 27.10 kg/m  Gen: NAD, alert, cooperative, and pleasant male. Grimacing in pain with standing and walking. Supporting left abdomen with hand.  HEENT: NCAT, EOMI, PERRL CV: RRR, no murmur Resp: CTAB, no wheezes, non-labored Abd: TTP over LUQ and mid left abdomen. No hepatomegaly, no rebound, good bowel sounds diffusely. No TTP  over right abdomen at all. No discrete mass, but firmness over LUQ.  Ext: No edema, warm Neuro: Alert and oriented, Speech clear, No gross deficits  Assessment and plan:  Headache Patient reports that Dr. Melrose Nakayama manages this (appears to be a neurologist at [facility] from website). No headache today, patient takes gabapentin 100 BID and nortriptyline 10 QHS for headaches.   LUQ abdominal pain I am not concerned for acute or surgical abdomen. Do want to pursue imaging based on firmness without discrete mass over LUQ and degree of pain with movement. Differential diagnosis includes diverticulosis, splenomegaly, mass, splenic infarct. Less likely etiologies include pancreatitis, gastritis. Ordered CBC, CMP, Lipase, lactic acid and CT abd pelvis with oral and IV contrast. VSS, afebrile. I am not concerned about sepsis or acute blood loss at present.   Coronary artery disease Cards is Dr. [name] through [facility]. Reviewed care everywhere - last visit 03/25/16. Discontinued spiro at that visit due to hypotension.   Hx of 2 MIs in 2008. Cardiac catheterization February 2009, 40% left main disease, 100% mid LAD, 90% ostial LCX dz with small distal vessel Stress echocardiogram October 2012 with small area of distal lateral distal inferior ischemia with resting LVEF 45%    Cerebral aneurysm "questionable aneurysm 1 x 2.5 mm at the distal left MCA bifurcation." Found in 2015 with CVA, followed by Dr. Melrose Nakayama through Neurosurg at [facility] per patient report. Reportedly gone on last MRI and no additional followup.  No acute concerns today, neuro exam normal.   History of CVA (cerebrovascular accident) Occurred in 2015, was already on ASA and plavix at that time. Patient denies afib. Statin should be high dose based on risk factors, but will address at future visit given his acute abdominal pain today.    Orders Placed This Encounter  Procedures  . CT ABDOMEN PELVIS W CONTRAST    Oral and IV contrast. Ordered stat CMP  for Cr.    Standing Status:   Future    Standing Expiration Date:   11/03/2017    Order Specific Question:   If indicated for the ordered procedure, I authorize the administration of contrast media per Radiology protocol    Answer:   Yes    Order Specific Question:   Reason for Exam (SYMPTOM  OR DIAGNOSIS REQUIRED)    Answer:   LUQ pain x1 month with diarrhea and gas. No rebound, but firmness on exam    Order Specific Question:   Preferred imaging location?    Answer:   [facility]    Order Specific Question:   Call Results- Best Contact Number?    AnswerDK:[phone]  . Comprehensive metabolic panel  . CBC  . Lipase, blood    Ralene Ok, MD, PGY1 08/06/2016 12:35 PM

## 2016-08-06 NOTE — Assessment & Plan Note (Signed)
Patient reports that Dr. Melrose Nakayama manages this (appears to be a neurologist at [facility] from website). No headache today, patient takes gabapentin 100 BID and nortriptyline 10 QHS for headaches.

## 2016-08-06 NOTE — Assessment & Plan Note (Signed)
Occurred in 2015, was already on ASA and plavix at that time. Patient denies afib. Statin should be high dose based on risk factors, but will address at future visit given his acute abdominal pain today.

## 2016-08-06 NOTE — Patient Instructions (Signed)
It was a pleasure to see you today! Thank you for choosing [facility] for your primary care. Donald [name] was seen for abdominal pain. I will call you with imaging and lab results tonight.   Best,  Dr. Lindell Noe

## 2016-08-06 NOTE — Assessment & Plan Note (Signed)
"  questionable aneurysm 1 x 2.5 mm at the distal left MCA bifurcation." Found in 2015 with CVA, followed by Dr. Melrose Nakayama through Neurosurg at [facility] per patient report. Reportedly gone on last MRI and no additional followup. No acute concerns today, neuro exam normal.

## 2016-08-06 NOTE — Telephone Encounter (Signed)
Called patient and significant other to discuss lab results. Recommended holding lisinopril in am and copious oral hydration. Will recheck BMP tomorrow STAT in our office and re order CT scan as able with Cr. Discussed presenting to ED or calling EMS is pain is acutely worsening or patient is not acting himself. They voiced understanding.  Ralene Ok, MD

## 2016-08-06 NOTE — Assessment & Plan Note (Signed)
Cards is Dr. [name] through [place]. Reviewed care everywhere - last visit 03/25/16. Discontinued spiro at that visit due to hypotension.   Hx of 2 MIs in 2008. Cardiac catheterization February 2009, 40% left main disease, 100% mid LAD, 90% ostial LCX dz with small distal vessel Stress echocardiogram October 2012 with small area of distal lateral distal inferior ischemia with resting LVEF 45%

## 2016-08-07 ENCOUNTER — Other Ambulatory Visit: Payer: Medicare HMO

## 2016-08-07 ENCOUNTER — Telehealth: Payer: Self-pay | Admitting: Family Medicine

## 2016-08-07 ENCOUNTER — Ambulatory Visit (HOSPITAL_COMMUNITY): Payer: Medicare HMO

## 2016-08-07 DIAGNOSIS — R1012 Left upper quadrant pain: Secondary | ICD-10-CM

## 2016-08-07 DIAGNOSIS — N179 Acute kidney failure, unspecified: Secondary | ICD-10-CM

## 2016-08-07 LAB — BASIC METABOLIC PANEL
Anion gap: 10 (ref 5–15)
BUN: 22 mg/dL — AB (ref 6–20)
CHLORIDE: 102 mmol/L (ref 101–111)
CO2: 26 mmol/L (ref 22–32)
Calcium: 9.5 mg/dL (ref 8.9–10.3)
Creatinine, Ser: 1.34 mg/dL — ABNORMAL HIGH (ref 0.61–1.24)
GFR calc Af Amer: >60 mL/min (ref >60)
GFR calc non Af Amer: 56 mL/min — ABNORMAL LOW (ref >60)
Glucose, Bld: 100 mg/dL — ABNORMAL HIGH (ref 65–99)
Potassium: 5.4 mmol/L — ABNORMAL HIGH (ref 3.5–5.1)
SODIUM: 138 mmol/L (ref 135–145)

## 2016-08-07 LAB — LACTIC ACID, PLASMA: Lactic Acid, Venous: 1.4 mmol/L (ref 0.5–1.9)

## 2016-08-07 NOTE — Telephone Encounter (Signed)
Called patient to discuss stat labs today. Cr and K slightly improved. Incidentally, patient requested to see me after getting his labs drawn and said that he felt a good bit better in terms of his abdominal pain after drinking lots of water. Discussed with patient and his significant other that they should hold lisinopril until I see them. We will order a BMP on Monday afternoon, as the patient will be occupied with his sister's eye surgery on Monday morning. Scheduled him for an appointment with me on Tuesday morning to discuss lab results and schedule future CT scan. Instructed patient that if at any point between now and Tuesday his abdominal pain worsens he should go to the emergency room.

## 2016-08-07 NOTE — Telephone Encounter (Signed)
Pt states he can do a CT scan anytime tomorrow

## 2016-08-11 ENCOUNTER — Other Ambulatory Visit: Payer: Self-pay | Admitting: Family Medicine

## 2016-08-11 ENCOUNTER — Other Ambulatory Visit: Payer: Medicare HMO

## 2016-08-11 DIAGNOSIS — N179 Acute kidney failure, unspecified: Secondary | ICD-10-CM

## 2016-08-11 LAB — BASIC METABOLIC PANEL WITH GFR
BUN: 19 mg/dL (ref 7–25)
CALCIUM: 8.9 mg/dL (ref 8.6–10.3)
CO2: 22 mmol/L (ref 20–31)
Chloride: 110 mmol/L (ref 98–110)
Creat: 1.25 mg/dL (ref 0.70–1.25)
GFR, EST AFRICAN AMERICAN: 71 mL/min (ref >=60)
GFR, EST NON AFRICAN AMERICAN: 62 mL/min (ref >=60)
GLUCOSE: 90 mg/dL (ref 65–99)
Potassium: 4.6 mmol/L (ref 3.5–5.3)
SODIUM: 139 mmol/L (ref 135–146)

## 2016-08-12 ENCOUNTER — Telehealth: Payer: Self-pay | Admitting: Family Medicine

## 2016-08-12 ENCOUNTER — Encounter: Payer: Self-pay | Admitting: Family Medicine

## 2016-08-12 ENCOUNTER — Ambulatory Visit (HOSPITAL_COMMUNITY)
Admission: RE | Admit: 2016-08-12 | Discharge: 2016-08-12 | Disposition: A | Discharge status: Home / Self Care | Payer: Medicare HMO | Source: Ambulatory Visit | Attending: Family Medicine | Admitting: Family Medicine

## 2016-08-12 ENCOUNTER — Ambulatory Visit (INDEPENDENT_AMBULATORY_CARE_PROVIDER_SITE_OTHER): Payer: Medicare HMO | Admitting: Family Medicine

## 2016-08-12 VITALS — BP 110/70 | HR 82 | Temp 98.5°F | Wt 153.0 lb

## 2016-08-12 DIAGNOSIS — R1032 Left lower quadrant pain: Secondary | ICD-10-CM

## 2016-08-12 DIAGNOSIS — I700 Atherosclerosis of aorta: Secondary | ICD-10-CM | POA: Insufficient documentation

## 2016-08-12 DIAGNOSIS — R109 Unspecified abdominal pain: Secondary | ICD-10-CM

## 2016-08-12 LAB — HIV ANTIBODY (ROUTINE TESTING W REFLEX): HIV: NONREACTIVE

## 2016-08-12 LAB — HEPATITIS C ANTIBODY: HCV Ab: NEGATIVE

## 2016-08-12 MED ORDER — IOPAMIDOL (ISOVUE-300) INJECTION 61%
INTRAVENOUS | Status: AC
  Administered 2016-08-12: 100 mL
  Filled 2016-08-12: qty 100

## 2016-08-12 NOTE — Patient Instructions (Signed)
I will call you with the imaging results, and at that time we can talk about when to recheck your kidney function.

## 2016-08-12 NOTE — Telephone Encounter (Signed)
Called patient to discuss his CT scan results. Neither he nor his significant other answered. Left one voicemail. If they call back, please tell them that his CT scan did not show anything concerning at this time. I am very reassured that there was nothing scary in his belly. I still think his pain is very real, and the cause may be the muscles in his abdominal wall. However, if his pain gets worse and unbearable, he should go to the emergency room. I would like to see him back around Wednesday the 21st. Please schedule him for a follow-up appointment. In the meantime, he can start his lisinopril medicine back on Saturday.

## 2016-08-12 NOTE — Progress Notes (Signed)
   CC: abdominal pain  HPI New patient last week with persistent abdominal pain x 1 month. Due to elevated Cr, counseled on aggressive PO hydration and held lisinopril. Cr improved on labs yesterday. Abdominal pain continues - dull at rest, but sharp in LLQ with significant movement.   CC, SH/smoking status, and VS noted  Objective: BP 110/70 (BP Location: Left Arm, Patient Position: Sitting, Cuff Size: Normal)   Pulse 82   Temp 98.5 F (36.9 C) (Oral)   Wt 153 lb (69.4 kg)   SpO2 99%   BMI 27.10 kg/m  Gen: Alert, cooperative and pleasant male, grimacing with walking.  HEENT: NCAT, EOMI, PERRL CV: RRR, no murmur Resp: CTAB, no wheezes, non-labored Abd: SNTND, BS present, no guarding or organomegaly Ext: No edema, warm Neuro: Alert and oriented, Speech clear, No gross deficits  Assessment and plan:  Left sided abdominal pain Pains persists since initial presentation last week with minimal change in pain. Mildly improved with drinking water. Stooling normally (his normal is every day or every other), including formed brown stool this morning that passed easily. Also notes flatus. Denies hematochezia or melena. Diffusely tender around umbilicus and on left side of abdomen on my exam today, with patient's most tender location being LLQ. Hx of C diff, but he says this feels nothing like current pain. Given persistence and degree of pain, ordered CT abdomen and pelvis with oral and IV contrast STAT. Patient NPO this morning, CT scheduled for noon. Given recent AKI, will continue to hold lisinopril and continue aggressive PO hydration. Will discuss with patient once imaging results, but will likely recheck BMP next week to ensure stability of Cr.     Orders Placed This Encounter  Procedures  . CT Abdomen Pelvis W Contrast    Left abdominal pain, LUQ and moved to LLQ today. 1 month duration, no acute injury. Lipase and lactic acid normal. Hx of stomach ulcer 30 years ago, recent history  last year of C diff. No rebound or guarding on exam.    Standing Status:   Future    Standing Expiration Date:   11/09/2017    Order Specific Question:   If indicated for the ordered procedure, I authorize the administration of contrast media per Radiology protocol    Answer:   Yes    Order Specific Question:   Reason for Exam (SYMPTOM  OR DIAGNOSIS REQUIRED)    Answer:   Left sided abdominal pain    Order Specific Question:   Preferred imaging location?    Answer:   [facility]    Order Specific Question:   Call Results- Best Contact Number?    Answer:   [phone]    Health maintenance:  Added HIV and Hep C screening onto yesterday's labs.   Ralene Ok, MD, PGY1 08/12/2016 10:02 AM

## 2016-08-12 NOTE — Assessment & Plan Note (Addendum)
Pains persists since initial presentation last week with minimal change in pain. Mildly improved with drinking water. Stooling normally (his normal is every day or every other), including formed brown stool this morning that passed easily. Also notes flatus. Denies hematochezia or melena. Diffusely tender around umbilicus and on left side of abdomen on my exam today, with patient's most tender location being LLQ. Hx of C diff, but he says this feels nothing like current pain. Given persistence and degree of pain, ordered CT abdomen and pelvis with oral and IV contrast STAT. Patient NPO this morning, CT scheduled for noon. Given recent AKI, will continue to hold lisinopril and continue aggressive PO hydration. Will discuss with patient once imaging results, but will likely recheck BMP next week to ensure stability of Cr.

## 2016-08-13 ENCOUNTER — Ambulatory Visit (HOSPITAL_COMMUNITY): Payer: Medicare HMO

## 2016-08-14 NOTE — Telephone Encounter (Signed)
Pt has an appointment scheduled on 08/20/16 with Dr. Lindell Noe. Katharina Caper, [name], [place]

## 2016-08-19 ENCOUNTER — Encounter: Payer: Self-pay | Admitting: Family Medicine

## 2016-08-20 ENCOUNTER — Ambulatory Visit (INDEPENDENT_AMBULATORY_CARE_PROVIDER_SITE_OTHER): Payer: Medicare HMO | Admitting: Family Medicine

## 2016-08-20 ENCOUNTER — Encounter: Payer: Self-pay | Admitting: Family Medicine

## 2016-08-20 DIAGNOSIS — R109 Unspecified abdominal pain: Secondary | ICD-10-CM | POA: Diagnosis not present

## 2016-08-20 DIAGNOSIS — I25110 Atherosclerotic heart disease of native coronary artery with unstable angina pectoris: Secondary | ICD-10-CM

## 2016-08-20 MED ORDER — ATORVASTATIN CALCIUM 40 MG PO TABS
40.0000 mg | ORAL_TABLET | Freq: Every day | ORAL | 3 refills | Status: DC
Start: 2016-08-20 — End: 2017-10-15

## 2016-08-20 NOTE — Assessment & Plan Note (Addendum)
Much improved. 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 However since patient is feeling much better today, will defer injection (would recommend combined steroid and local anesthetic) for later appointment if pain recurs. Instructed patient again on return precautions and return precautions for emergency room.

## 2016-08-20 NOTE — Patient Instructions (Signed)
It was a pleasure to see you today! Thank you for choosing [facility] for your primary care. Donald [name] was seen for follow up abdominal pain. Come back to the clinic if it returns, and please continue to drink lots of water to keep your kidneys healthy. I am getting your colonoscopy records and also sent a higher statin for you [facility].   Best,  Dr. Lindell Noe

## 2016-08-20 NOTE — Assessment & Plan Note (Signed)
Noted that patient is only on moderate intensity statin, but high dose statin indefinitely would be appropriate for secondary prevention given his history. Patient cannot recall any reason why he was not on a higher dose. Sent new prescription for atorvastatin 40 mg daily to [place].

## 2016-08-20 NOTE — Progress Notes (Signed)
   CC: follow up abdominal pain  HPI Abdominal pain has almost completely resolved. Still had a transient episode of pain since I saw him last, but feels much better today. Some mild tenderness over LLQ.   Health maintenance - found last TDAP in [place]. Reports colonoscopy somewhere on [facility]. He thinks it was [facility] on further review.   CC, SH/smoking status, and VS noted  Objective: BP 128/64   Pulse 72   Temp 97.6 F (36.4 C) (Oral)   Ht 5\' 3"  (1.6 m)   Wt 153 lb (69.4 kg)   SpO2 98%   BMI 27.10 kg/m  Gen: NAD, alert, cooperative, and pleasant. HEENT: NCAT, EOMI, PERRL CV: RRR, no murmur Resp: CTAB, no wheezes, non-labored Abd: SND, BS present, no guarding or organomegaly. Mild TTP over LLQ near rectus sheath. Carnett's sign mildly positive (some increase in pain with straight leg raise). Will Ext: No edema, warm Neuro: Alert and oriented, Speech clear, No gross deficits  Assessment and plan:  Coronary artery disease Noted that patient is only on moderate intensity statin, but high dose statin indefinitely would be appropriate for secondary prevention given his history. Patient cannot recall any reason why he was not on a higher dose. Sent new prescription for atorvastatin 40 mg daily to [place].  Left sided abdominal pain Much improved. 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 However since patient is feeling much better today, will defer injection (would recommend combined steroid and local anesthetic) for later appointment if pain recurs. Instructed patient again on return precautions and return precautions for emergency room.   Health maintenance:  ROI filled out for [facility] for last colonoscopy records.   Meds ordered this encounter  Medications  . atorvastatin (LIPITOR) 40 MG tablet    Sig: Take 1 tablet (40 mg total) by mouth daily.    Dispense:  90 tablet   Refill:  3     Ralene Ok, MD, PGY1 08/20/2016 10:14 AM

## 2016-08-26 ENCOUNTER — Encounter: Payer: Medicare HMO | Admitting: Family Medicine

## 2016-08-27 ENCOUNTER — Telehealth: Payer: Self-pay | Admitting: Family Medicine

## 2016-08-27 NOTE — Telephone Encounter (Signed)
[facility] pharmacy says the RX for Lipitor was not received. [facility] says the best way to send a prescription is to speak with someone.  [phone]

## 2016-08-27 NOTE — Telephone Encounter (Signed)
Please let patient know that [facility] received is Lipitor Rx on 08/20/16.  He will need to call and see when they will ship it out to him.  Derl Barrow, RN

## 2016-09-29 ENCOUNTER — Encounter: Payer: Self-pay | Admitting: Family Medicine

## 2016-09-29 ENCOUNTER — Ambulatory Visit (INDEPENDENT_AMBULATORY_CARE_PROVIDER_SITE_OTHER): Payer: Medicare HMO | Admitting: Family Medicine

## 2016-09-29 DIAGNOSIS — I25110 Atherosclerotic heart disease of native coronary artery with unstable angina pectoris: Secondary | ICD-10-CM

## 2016-09-29 NOTE — Assessment & Plan Note (Addendum)
BP low today, BP meds managed by cardiology. Recommended PO hydration as he is asymptomatic. Told him to call us if his BPs continue running in the 100s and we will decrease lisinopril. Notably, per cards notes he was supposed to be on coreg 3.125mg  BID - this was not in our records. Added to med list - patient's significant other confirmed this via phone.

## 2016-09-29 NOTE — Patient Instructions (Signed)
It was a pleasure to see you today! Thank you for choosing [facility] for your primary care. Donald [name] was seen for a wellness physical. Everything looks great! Please make sure to drink a lot of water to help with your blood pressure. Call us if your blood pressures are running low at home or you feel like you might pass out. I'll call [name] about her nausea. We are working on getting your colonoscopy results. See you in 6 months!   Best,  Dr. Lindell Noe

## 2016-09-29 NOTE — Progress Notes (Signed)
   CC: CPE  HPI  Seraj presents today for a physical at the request of his insurance. He denies any symptoms. He has been feeling well lately, and taking his medications as prescribed. BP is slightly low today. He took his lisinopril and imdur this morning. He takes his BP every day at home and this runs 100s-130s. He does not feel lightheaded or dizzy today. Notably, he hasn't eaten this morning yet.   He sees cardiology and neurology at [facility]. On Care Everywhere review, last cards visit was 03/25/16. Spironolactone was stopped at that visit 2/2 hypotension.   CC, SH/smoking status, and VS noted  Objective: BP (!) 102/52   Pulse 67   Temp 97.3 F (36.3 C) (Oral)   Wt 150 lb (68 kg)   SpO2 99%   BMI 26.57 kg/m  Gen: NAD, alert, cooperative, and pleasant. HEENT: NCAT, EOMI, PERRL CV: RRR, no murmur Resp: CTAB, no wheezes, non-labored Abd: SNTND, BS present, no guarding or organomegaly Ext: No edema, warm Neuro: Alert and oriented, Speech clear, No gross deficits  Assessment and plan:  Coronary artery disease BP low today, BP meds managed by cardiology. Recommended PO hydration as he is asymptomatic. Told him to call us if his BPs continue running in the 100s and we will decrease lisinopril. Notably, per cards notes he was supposed to be on coreg 3.125mg  BID - this was not in our records. Added to med list - patient's significant other confirmed this via phone.  Health maintenance:  Recent labs here.  Lipid panel 12/2015 at [facility], [place] will repeat this summer.  It seems records request to [facility] was never sent at last visit, will re-fax today.   Ralene Ok, MD, PGY1 09/29/2016 10:20 AM

## 2016-10-03 ENCOUNTER — Encounter: Payer: Self-pay | Admitting: Family Medicine

## 2016-12-16 ENCOUNTER — Encounter: Payer: Self-pay | Admitting: Family Medicine

## 2016-12-23 DIAGNOSIS — H2513 Age-related nuclear cataract, bilateral: Secondary | ICD-10-CM | POA: Diagnosis not present

## 2017-02-24 ENCOUNTER — Ambulatory Visit (HOSPITAL_COMMUNITY)
Admission: EM | Admit: 2017-02-24 | Discharge: 2017-02-24 | Disposition: A | Discharge status: Home / Self Care | Payer: Medicare HMO | Attending: Emergency Medicine | Admitting: Emergency Medicine

## 2017-02-24 ENCOUNTER — Encounter (HOSPITAL_COMMUNITY): Payer: Self-pay | Admitting: Family Medicine

## 2017-02-24 DIAGNOSIS — T162XXA Foreign body in left ear, initial encounter: Secondary | ICD-10-CM | POA: Diagnosis not present
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
## 2017-02-24 NOTE — ED Triage Notes (Signed)
Pt here for piece of ear bud stuck in ear.

## 2017-02-24 NOTE — Discharge Instructions (Signed)
Ear bud removed. No signs of ear canal infection today. Monitor if you experience any ear pain, ear discharge, hearing changes, follow up here or with PCP for reevaluation.

## 2017-05-23 ENCOUNTER — Inpatient Hospital Stay (HOSPITAL_COMMUNITY): Payer: Medicare HMO

## 2017-05-23 ENCOUNTER — Observation Stay (HOSPITAL_COMMUNITY)
Admission: EM | Admit: 2017-05-23 | Discharge: 2017-05-24 | Disposition: A | Discharge status: Home / Self Care | Payer: Medicare HMO | Attending: Family Medicine | Admitting: Family Medicine

## 2017-05-23 ENCOUNTER — Emergency Department (HOSPITAL_COMMUNITY): Payer: Medicare HMO

## 2017-05-23 ENCOUNTER — Encounter (HOSPITAL_COMMUNITY): Payer: Self-pay | Admitting: Emergency Medicine

## 2017-05-23 ENCOUNTER — Other Ambulatory Visit: Payer: Self-pay

## 2017-05-23 DIAGNOSIS — Z8673 Personal history of transient ischemic attack (TIA), and cerebral infarction without residual deficits: Secondary | ICD-10-CM | POA: Diagnosis not present

## 2017-05-23 DIAGNOSIS — Z882 Allergy status to sulfonamides status: Secondary | ICD-10-CM | POA: Diagnosis not present

## 2017-05-23 DIAGNOSIS — I2510 Atherosclerotic heart disease of native coronary artery without angina pectoris: Secondary | ICD-10-CM | POA: Insufficient documentation

## 2017-05-23 DIAGNOSIS — Z885 Allergy status to narcotic agent status: Secondary | ICD-10-CM | POA: Diagnosis not present

## 2017-05-23 DIAGNOSIS — J9811 Atelectasis: Secondary | ICD-10-CM | POA: Diagnosis not present

## 2017-05-23 DIAGNOSIS — Z9889 Other specified postprocedural states: Secondary | ICD-10-CM | POA: Insufficient documentation

## 2017-05-23 DIAGNOSIS — I252 Old myocardial infarction: Secondary | ICD-10-CM | POA: Insufficient documentation

## 2017-05-23 DIAGNOSIS — K449 Diaphragmatic hernia without obstruction or gangrene: Secondary | ICD-10-CM | POA: Insufficient documentation

## 2017-05-23 DIAGNOSIS — Z87891 Personal history of nicotine dependence: Secondary | ICD-10-CM | POA: Insufficient documentation

## 2017-05-23 DIAGNOSIS — Z7902 Long term (current) use of antithrombotics/antiplatelets: Secondary | ICD-10-CM | POA: Insufficient documentation

## 2017-05-23 DIAGNOSIS — Z79899 Other long term (current) drug therapy: Secondary | ICD-10-CM | POA: Insufficient documentation

## 2017-05-23 DIAGNOSIS — R1030 Lower abdominal pain, unspecified: Secondary | ICD-10-CM | POA: Diagnosis not present

## 2017-05-23 DIAGNOSIS — Z7982 Long term (current) use of aspirin: Secondary | ICD-10-CM | POA: Insufficient documentation

## 2017-05-23 DIAGNOSIS — R1032 Left lower quadrant pain: Secondary | ICD-10-CM | POA: Diagnosis not present

## 2017-05-23 DIAGNOSIS — R109 Unspecified abdominal pain: Secondary | ICD-10-CM | POA: Diagnosis present

## 2017-05-23 DIAGNOSIS — E039 Hypothyroidism, unspecified: Secondary | ICD-10-CM | POA: Diagnosis not present

## 2017-05-23 DIAGNOSIS — Z9049 Acquired absence of other specified parts of digestive tract: Secondary | ICD-10-CM | POA: Diagnosis not present

## 2017-05-23 DIAGNOSIS — Z880 Allergy status to penicillin: Secondary | ICD-10-CM | POA: Insufficient documentation

## 2017-05-23 DIAGNOSIS — I110 Hypertensive heart disease with heart failure: Secondary | ICD-10-CM | POA: Insufficient documentation

## 2017-05-23 DIAGNOSIS — N179 Acute kidney failure, unspecified: Secondary | ICD-10-CM | POA: Insufficient documentation

## 2017-05-23 DIAGNOSIS — R079 Chest pain, unspecified: Secondary | ICD-10-CM | POA: Diagnosis not present

## 2017-05-23 DIAGNOSIS — K219 Gastro-esophageal reflux disease without esophagitis: Secondary | ICD-10-CM | POA: Diagnosis not present

## 2017-05-23 LAB — URINALYSIS, ROUTINE W REFLEX MICROSCOPIC
Bilirubin Urine: NEGATIVE
GLUCOSE, UA: NEGATIVE mg/dL
Hgb urine dipstick: NEGATIVE
Ketones, ur: NEGATIVE mg/dL
LEUKOCYTES UA: NEGATIVE
Nitrite: NEGATIVE
PH: 5 (ref 5.0–8.0)
PROTEIN: NEGATIVE mg/dL
Specific Gravity, Urine: 1.014 (ref 1.005–1.030)

## 2017-05-23 LAB — BASIC METABOLIC PANEL
Anion gap: 11 (ref 5–15)
BUN: 31 mg/dL — ABNORMAL HIGH (ref 6–20)
CHLORIDE: 103 mmol/L (ref 101–111)
CO2: 22 mmol/L (ref 22–32)
CREATININE: 2.66 mg/dL — AB (ref 0.61–1.24)
Calcium: 9.5 mg/dL (ref 8.9–10.3)
GFR, EST AFRICAN AMERICAN: 28 mL/min — AB (ref >60)
GFR, EST NON AFRICAN AMERICAN: 24 mL/min — AB (ref >60)
Glucose, Bld: 115 mg/dL — ABNORMAL HIGH (ref 65–99)
POTASSIUM: 4.7 mmol/L (ref 3.5–5.1)
SODIUM: 136 mmol/L (ref 135–145)

## 2017-05-23 LAB — LIPID PANEL
Cholesterol: 81 mg/dL (ref 0–200)
HDL: 25 mg/dL — AB (ref >40)
LDL CALC: 38 mg/dL (ref 0–99)
TRIGLYCERIDES: 92 mg/dL (ref <150)
Total CHOL/HDL Ratio: 3.2 RATIO
VLDL: 18 mg/dL (ref 0–40)

## 2017-05-23 LAB — CBC
HEMATOCRIT: 42.7 % (ref 39.0–52.0)
Hemoglobin: 14.2 g/dL (ref 13.0–17.0)
MCH: 29.9 pg (ref 26.0–34.0)
MCHC: 33.3 g/dL (ref 30.0–36.0)
MCV: 89.9 fL (ref 78.0–100.0)
PLATELETS: 272 10*3/uL (ref 150–400)
RBC: 4.75 MIL/uL (ref 4.22–5.81)
RDW: 13.1 % (ref 11.5–15.5)
WBC: 11.1 10*3/uL — AB (ref 4.0–10.5)

## 2017-05-23 LAB — CBG MONITORING, ED: Glucose-Capillary: 85 mg/dL (ref 65–99)

## 2017-05-23 LAB — BRAIN NATRIURETIC PEPTIDE: B Natriuretic Peptide: 54.3 pg/mL (ref 0.0–100.0)

## 2017-05-23 LAB — I-STAT TROPONIN, ED
TROPONIN I, POC: 0.01 ng/mL (ref 0.00–0.08)
Troponin i, poc: 0 ng/mL (ref 0.00–0.08)

## 2017-05-23 LAB — TROPONIN I

## 2017-05-23 LAB — TSH: TSH: 0.608 u[IU]/mL (ref 0.350–4.500)

## 2017-05-23 LAB — LACTIC ACID, PLASMA
Lactic Acid, Venous: 1.2 mmol/L (ref 0.5–1.9)
Lactic Acid, Venous: 1.3 mmol/L (ref 0.5–1.9)

## 2017-05-23 MED ORDER — POLYETHYLENE GLYCOL 3350 17 G PO PACK: 17.0000 g | PACK | Freq: Every day | ORAL | Status: DC | PRN

## 2017-05-23 MED ORDER — ATORVASTATIN CALCIUM 40 MG PO TABS
40.0000 mg | ORAL_TABLET | Freq: Every day | ORAL | Status: DC
  Administered 2017-05-23 – 2017-05-24 (×2): 40 mg via ORAL
  Filled 2017-05-23 (×2): qty 1

## 2017-05-23 MED ORDER — ACETAMINOPHEN 650 MG RE SUPP: 650.0000 mg | Freq: Four times a day (QID) | RECTAL | Status: DC | PRN

## 2017-05-23 MED ORDER — ISOSORBIDE MONONITRATE ER 30 MG PO TB24
30.0000 mg | ORAL_TABLET | Freq: Every day | ORAL | Status: DC
  Administered 2017-05-23 – 2017-05-24 (×2): 30 mg via ORAL
  Filled 2017-05-23 (×2): qty 1

## 2017-05-23 MED ORDER — CARVEDILOL 3.125 MG PO TABS
3.1250 mg | ORAL_TABLET | Freq: Two times a day (BID) | ORAL | Status: DC
  Administered 2017-05-24 (×2): 3.125 mg via ORAL
  Filled 2017-05-23 (×2): qty 1

## 2017-05-23 MED ORDER — TAMSULOSIN HCL 0.4 MG PO CAPS
0.4000 mg | ORAL_CAPSULE | Freq: Every day | ORAL | Status: DC
  Administered 2017-05-23 – 2017-05-24 (×2): 0.4 mg via ORAL
  Filled 2017-05-23 (×2): qty 1

## 2017-05-23 MED ORDER — HEPARIN SODIUM (PORCINE) 5000 UNIT/ML IJ SOLN
5000.0000 [IU] | Freq: Three times a day (TID) | INTRAMUSCULAR | Status: DC
  Administered 2017-05-23 – 2017-05-24 (×3): 5000 [IU] via SUBCUTANEOUS
  Filled 2017-05-23 (×2): qty 1

## 2017-05-23 MED ORDER — SODIUM CHLORIDE 0.9 % IV SOLN
1.0000 g | Freq: Two times a day (BID) | INTRAVENOUS | Status: DC
  Administered 2017-05-23 – 2017-05-24 (×2): 1 g via INTRAVENOUS
  Filled 2017-05-23 (×3): qty 1

## 2017-05-23 MED ORDER — ONDANSETRON HCL 4 MG PO TABS: 4.0000 mg | ORAL_TABLET | Freq: Four times a day (QID) | ORAL | Status: DC | PRN

## 2017-05-23 MED ORDER — NITROGLYCERIN 0.4 MG SL SUBL: 0.4000 mg | SUBLINGUAL_TABLET | SUBLINGUAL | Status: DC | PRN

## 2017-05-23 MED ORDER — ACETAMINOPHEN 325 MG PO TABS: 650.0000 mg | ORAL_TABLET | Freq: Four times a day (QID) | ORAL | Status: DC | PRN

## 2017-05-23 MED ORDER — SODIUM CHLORIDE 0.9 % IV BOLUS (SEPSIS)
1000.0000 mL | Freq: Once | INTRAVENOUS | Status: AC
  Administered 2017-05-23: 1000 mL via INTRAVENOUS

## 2017-05-23 MED ORDER — ONDANSETRON HCL 4 MG/2ML IJ SOLN: 4.0000 mg | Freq: Four times a day (QID) | INTRAMUSCULAR | Status: DC | PRN

## 2017-05-23 MED ORDER — GABAPENTIN 100 MG PO CAPS
100.0000 mg | ORAL_CAPSULE | Freq: Two times a day (BID) | ORAL | Status: DC
  Administered 2017-05-23 – 2017-05-24 (×2): 100 mg via ORAL
  Filled 2017-05-23 (×2): qty 1

## 2017-05-23 MED ORDER — IOPAMIDOL (ISOVUE-300) INJECTION 61%
INTRAVENOUS | Status: AC
  Filled 2017-05-23: qty 30

## 2017-05-23 NOTE — ED Notes (Signed)
Patient transported to X-ray 
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
## 2017-05-23 NOTE — ED Notes (Addendum)
Pt CBG 85 

## 2017-05-23 NOTE — Progress Notes (Signed)
To CT via bed. SWOT accompanied patient to CT. Telemetry applied.

## 2017-05-23 NOTE — Progress Notes (Signed)
Pharmacy Antibiotic Note  LYNDOL VANDERHEIDEN is a 61 y.o. male admitted on 05/23/2017 with intra-abdominal infection. Pharmacy has been consulted for meropenem dosing. AKI noted sCr 2.66 and CrCl 24 ml/min.  Plan: 1) Meropenem 1g IV q12 2) Follow renal function, cultures, LOT  Height: 5\' 4"  (162.6 cm) Weight: 155 lb (70.3 kg) IBW/kg (Calculated) : 59.2  Temp (24hrs), Avg:98.2 F (36.8 C), Min:98.2 F (36.8 C), Max:98.2 F (36.8 C)  Recent Labs  Lab 05/23/17 1225  WBC 11.1*  CREATININE 2.66*    Estimated Creatinine Clearance: 24.1 mL/min (A) (by C-G formula based on SCr of 2.66 mg/dL (H)).    Allergies  Allergen Reactions  . Penicillins Shortness Of Breath and Rash  . Sulfa Antibiotics Hives, Shortness Of Breath and Rash  . Tramadol Nausea And Vomiting    Antimicrobials this admission: 11/24 Meropenem >>  Dose adjustments this admission: n/a  Microbiology results: 11/24 blood x2 >>   Thank you for allowing pharmacy to be a part of this patient's care.  Deboraha Sprang 05/23/2017 5:30 PM

## 2017-05-23 NOTE — ED Notes (Signed)
After speaking with the admitting doctors, the pt is agreeing to stay tonight only.

## 2017-05-23 NOTE — ED Notes (Signed)
Pt called out stating he would now like to go home again. RN notified pt that she would have to call the admitting doctors to facilitate that and make that decision. Family medicine paged, awaiting response

## 2017-05-23 NOTE — ED Triage Notes (Signed)
Pt c/o midsternal chest pain shortness of breath and abd pain. Started 2 days ago, gotten worse today-- c/o body aches "all over"

## 2017-05-23 NOTE — ED Notes (Signed)
Notified pt of need for urine. Pt unable to give urine at this time.

## 2017-05-23 NOTE — ED Notes (Signed)
Pt ambulatory to restroom independently.

## 2017-05-23 NOTE — ED Notes (Signed)
Pt called out stating he was "shaking and didn't know why" also stated "maybe I should stay and be admitted" NT to check CBG and pt given a [place] sandwich
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
## 2017-05-24 ENCOUNTER — Inpatient Hospital Stay (HOSPITAL_COMMUNITY): Payer: Medicare HMO

## 2017-05-24 DIAGNOSIS — I255 Ischemic cardiomyopathy: Secondary | ICD-10-CM | POA: Diagnosis not present

## 2017-05-24 DIAGNOSIS — K219 Gastro-esophageal reflux disease without esophagitis: Secondary | ICD-10-CM | POA: Diagnosis not present

## 2017-05-24 DIAGNOSIS — I361 Nonrheumatic tricuspid (valve) insufficiency: Secondary | ICD-10-CM

## 2017-05-24 DIAGNOSIS — I110 Hypertensive heart disease with heart failure: Secondary | ICD-10-CM | POA: Diagnosis not present

## 2017-05-24 DIAGNOSIS — E039 Hypothyroidism, unspecified: Secondary | ICD-10-CM | POA: Diagnosis not present

## 2017-05-24 DIAGNOSIS — I2510 Atherosclerotic heart disease of native coronary artery without angina pectoris: Secondary | ICD-10-CM | POA: Diagnosis not present

## 2017-05-24 DIAGNOSIS — R1032 Left lower quadrant pain: Secondary | ICD-10-CM | POA: Diagnosis not present

## 2017-05-24 DIAGNOSIS — N179 Acute kidney failure, unspecified: Secondary | ICD-10-CM

## 2017-05-24 DIAGNOSIS — R079 Chest pain, unspecified: Secondary | ICD-10-CM | POA: Diagnosis not present

## 2017-05-24 DIAGNOSIS — I252 Old myocardial infarction: Secondary | ICD-10-CM | POA: Diagnosis not present

## 2017-05-24 DIAGNOSIS — Z8673 Personal history of transient ischemic attack (TIA), and cerebral infarction without residual deficits: Secondary | ICD-10-CM | POA: Diagnosis not present

## 2017-05-24 LAB — CBC
HCT: 36.3 % — ABNORMAL LOW (ref 39.0–52.0)
HCT: 36 % — ABNORMAL LOW (ref 39.0–52.0)
Hemoglobin: 12.1 g/dL — ABNORMAL LOW (ref 13.0–17.0)
Hemoglobin: 11.9 g/dL — ABNORMAL LOW (ref 13.0–17.0)
MCH: 29.2 pg (ref 26.0–34.0)
MCH: 28.9 pg (ref 26.0–34.0)
MCHC: 33.3 g/dL (ref 30.0–36.0)
MCHC: 33.1 g/dL (ref 30.0–36.0)
MCV: 87.5 fL (ref 78.0–100.0)
MCV: 87.4 fL (ref 78.0–100.0)
PLATELETS: 231 10*3/uL (ref 150–400)
PLATELETS: 230 10*3/uL (ref 150–400)
RBC: 4.15 MIL/uL — AB (ref 4.22–5.81)
RBC: 4.12 MIL/uL — ABNORMAL LOW (ref 4.22–5.81)
RDW: 12.7 % (ref 11.5–15.5)
RDW: 12.7 % (ref 11.5–15.5)
WBC: 11 10*3/uL — AB (ref 4.0–10.5)
WBC: 9.4 10*3/uL (ref 4.0–10.5)

## 2017-05-24 LAB — ECHOCARDIOGRAM COMPLETE
HEIGHTINCHES: 64 in
Weight: 2480 oz

## 2017-05-24 LAB — BASIC METABOLIC PANEL
Anion gap: 8 (ref 5–15)
BUN: 20 mg/dL (ref 6–20)
CALCIUM: 8.8 mg/dL — AB (ref 8.9–10.3)
CO2: 21 mmol/L — ABNORMAL LOW (ref 22–32)
CREATININE: 1.65 mg/dL — AB (ref 0.61–1.24)
Chloride: 107 mmol/L (ref 101–111)
GFR, EST AFRICAN AMERICAN: 50 mL/min — AB (ref >60)
GFR, EST NON AFRICAN AMERICAN: 43 mL/min — AB (ref >60)
Glucose, Bld: 95 mg/dL (ref 65–99)
Potassium: 4.7 mmol/L (ref 3.5–5.1)
SODIUM: 136 mmol/L (ref 135–145)

## 2017-05-24 LAB — CREATININE, SERUM
CREATININE: 1.88 mg/dL — AB (ref 0.61–1.24)
GFR, EST AFRICAN AMERICAN: 43 mL/min — AB (ref >60)
GFR, EST NON AFRICAN AMERICAN: 37 mL/min — AB (ref >60)

## 2017-05-24 LAB — HEMOGLOBIN A1C
HEMOGLOBIN A1C: 5.2 % (ref 4.8–5.6)
MEAN PLASMA GLUCOSE: 103 mg/dL

## 2017-05-24 LAB — TROPONIN I
TROPONIN I: 0.06 ng/mL — AB (ref <0.03)
Troponin I: 0.07 ng/mL (ref <0.03)

## 2017-05-24 MED ORDER — LISINOPRIL 10 MG PO TABS
10.0000 mg | ORAL_TABLET | Freq: Every day | ORAL | Status: DC
  Administered 2017-05-24: 10 mg via ORAL
  Filled 2017-05-24: qty 1

## 2017-05-24 MED ORDER — CLOPIDOGREL BISULFATE 75 MG PO TABS
75.0000 mg | ORAL_TABLET | Freq: Every day | ORAL | Status: DC
  Administered 2017-05-24: 75 mg via ORAL
  Filled 2017-05-24: qty 1

## 2017-05-24 NOTE — Discharge Instructions (Signed)
Your lab work today shows injury to your kidneys. This may be from dehydration. I want you to stop lasix until you see your cardiologist this Thursday.   Stay well-hydrated, 2L of water daily is recommended. Make an appointment with your cardiologist and primary care provider as soon as possible. Return to the ED for worsening symptoms.

## 2017-05-24 NOTE — Care Management CC44 (Deleted)
Condition Code 44 Documentation Completed  Patient Details  Name: REXTON GREULICH MRN: [id number] Date of Birth: [birth year]   Condition Code 44 given:    Patient signature on Condition Code 44 notice:    Documentation of 2 MD's agreement:    Code 44 added to claim:       Delrae Sawyers, RN 05/24/2017, 3:17 PM

## 2017-05-24 NOTE — Care Management CC44 (Signed)
Condition Code 44 Documentation Completed  Patient Details  Name: Donald [name] MRN: [id number] Date of Birth: December 09, 1955   Condition Code 44 given:  Yes Patient signature on Condition Code 44 notice:  Yes Documentation of 2 MD's agreement:  Yes Code 44 added to claim:  Yes    Delrae Sawyers, RN 05/24/2017, 3:18 PM

## 2017-05-24 NOTE — Care Management Obs Status (Signed)
[facility] NOTIFICATION   Patient Details  Name: Donald [name] MRN: [id number] Date of Birth: Jun 06, 1956   Medicare Observation Status Notification Given:       Delrae Sawyers, RN 05/24/2017, 3:17 PM
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
## 2017-05-24 NOTE — Discharge Summary (Signed)
[name]  Patient name: Donald [name] Medical record number: [id number] Date of birth: 1955-10-08 Age: 61 y.o. Gender: male Date of Admission: 05/23/2017  Date of Discharge: 05/24/2017 Admitting Physician: Lind Covert, MD  Primary Care Provider: [name], MD Consultants: Cardiology  Indication for Hospitalization: ACS r/o  Discharge Diagnoses/Problem List:  Chest pain, resolved LLQ abdominal pain, resolved AKI, improved H/o prior stroke Hypothyroidism, stable ?BPH, stable  Disposition: Home  Discharge Condition: Improved  Discharge Exam:  General: Well appearing elderly man  Cardiovascular: RRR, no murmurs, rubs Respiratory: CTAB, normal WOB  Abdomen: Slight umbilical tenderness, BS+, no umbilical hernia noted,  Extremities: No lower extremity edema Performed by Dr. Emmaline Life, PGY-3 the day of discharge.  Brief Hospital Course:  Donald [name] a 61 y.o.male with PMH significant for CAD, CHF, GERD, h/o stroke 2015 with residual neurologic deficits, and hypothyroidismpresenting with loss of appetite, general malaise, intermittent chest painand LLQ pain for 2 days admitted for ACS r/o. Cardiac workup largely negative including EKG NSR, CXR negative, Troponins did trend up 0.00>0.01>0.03>0.06>0.07 but likely due to demand ischemia as patient was dehydrated on admission with possible GI illness. Intermittent chest pain resolved in the ED. Cardiology was consulted who agreed with possible demand ischemia and recommended continued follow up with patient's outpatient Cardiologist, Dr. [name] at [facility].  Patient's intermittent LLQ abdominal pain resolved after 2L NS bolus in ED. Due to patient's history of hernia, obtained abdominal CT which showed no evidence of incarceration but evidenced mild fluid-filled distension of jejunal loops, possibly indicating enteritis. Patient remained afebrile with stable  vitals after fluid resuscitation. Blood cultures NGx3d. Patient tolerated a diet prior to discharge. Patient's AKI to Cr 2.66 also improved after adequate IV rehydration and was likely due to dehydration.  Issues for Follow Up:  1. Calcified right hepatic dome granuloma visualized on abdominal CT on 07/2016 and 04/2017. Consider further outpatient work up as necessary.    Significant Procedures: None  Significant Labs and Imaging:  Recent Labs  Lab 05/23/17 1225 05/23/17 2322 05/24/17 0556  WBC 11.1* 11.0* 9.4  HGB 14.2 12.1* 11.9*  HCT 42.7 36.3* 36.0*  PLT 272 231 230   Recent Labs  Lab 05/23/17 1225 05/23/17 2322 05/24/17 0556  NA 136  --  136  K 4.7  --  4.7  CL 103  --  107  CO2 22  --  21*  GLUCOSE 115*  --  95  BUN 31*  --  20  CREATININE 2.66* 1.88* 1.65*  CALCIUM 9.5  --  8.8*    Ct Abdomen Pelvis Wo Contrast  Result Date: 05/23/2017 CLINICAL DATA:  Lower abdominal pain for 2 days. EXAM: CT ABDOMEN AND PELVIS WITHOUT CONTRAST TECHNIQUE: Multidetector CT imaging of the abdomen and pelvis was performed following the standard protocol without IV contrast. COMPARISON:  08/12/2016 FINDINGS: Lower chest: Normal heart size. Clear lung bases. Small hiatal hernia. Hepatobiliary: Small granuloma in the right hepatic dome. Status post cholecystectomy. No biliary dilatation or mass identified given limitations of a noncontrast study. Pancreas: No ductal dilatation or mass.  No inflammation. Spleen: Normal Adrenals/Urinary Tract: Normal bilateral adrenal glands. 11 mm hyperdensity in the posterior aspect of the left kidney would be in keeping with a complex cyst possibly proteinaceous or hemorrhagic cyst. Further characterization not possible given lack of IV contrast. No significant change in size since prior. Physiologically distended urinary bladder without focal mural thickening or calculus. Stomach/Bowel: Small hiatal hernia. Physiologically  distended stomach with normal small  bowel rotation. Small duodenal diverticulum off the second portion. Mild fluid-filled distention of jejunum with slight mural thickening raises the possibility of an enteritis. No bowel obstruction is seen as contrast extends to the level of the rectum. Normal terminal and distal ileum. Status post appendectomy. Vascular/Lymphatic: Moderate aortoiliac atherosclerosis without aneurysm. No lymphadenopathy. Reproductive: Prostate is unremarkable. Other: None Musculoskeletal: Facet arthropathy of the lumbar spine. No acute nor suspicious osseous lesions. Bridging osteophytes across the sacroiliac joints as before. IMPRESSION: 1. Mild fluid-filled distention of jejunal loops question enteritis. No mechanical obstruction. 2. Hyperdense 11 mm cyst noted of the interpolar left kidney unchanged in size possibly representing a proteinaceous or hemorrhagic cyst. 3. Right hepatic dome granuloma. 4. Lumbar facet arthropathy. 5. Aortoiliac atherosclerosis. Electronically Signed   By: Ashley Royalty M.D.   On: 05/23/2017 21:45   Dg Chest 2 View  Result Date: 05/23/2017 CLINICAL DATA:  61 year old male with recent onset left-sided chest pain. EXAM: CHEST  2 VIEW COMPARISON:  06/28/2014 FINDINGS: The heart size and mediastinal contours are within normal limits. There is bibasilar atelectasis.  Both lungs are otherwise clear. The visualized skeletal structures are unremarkable. IMPRESSION: Bibasilar atelectasis without acute intrathoracic pathology. Electronically Signed   By: Kristopher Oppenheim M.D.   On: 05/23/2017 13:45   Results/Tests Pending at Time of Discharge: None  Discharge Medications:  Allergies as of 05/24/2017      Reactions   Penicillins Shortness Of Breath, Rash   Sulfa Antibiotics Hives, Shortness Of Breath, Rash   Tramadol Nausea And Vomiting      Medication List    STOP taking these medications   furosemide 20 MG tablet Commonly known as:  LASIX     TAKE these medications   aspirin EC 81 MG  tablet Take 81 mg by mouth daily.   atorvastatin 40 MG tablet Commonly known as:  LIPITOR Take 1 tablet (40 mg total) by mouth daily.   carvedilol 3.125 MG tablet Commonly known as:  COREG Take 3.125 mg by mouth 2 (two) times daily with a meal.   clopidogrel 75 MG tablet Commonly known as:  PLAVIX Take 75 mg by mouth daily.   [employer] Crea 1 Dose by Does not apply route as needed.   gabapentin 100 MG capsule Commonly known as:  NEURONTIN Take 100 mg by mouth 2 (two) times daily.   isosorbide mononitrate 30 MG 24 hr tablet Commonly known as:  IMDUR Take 30 mg by mouth daily.   lisinopril 10 MG tablet Commonly known as:  PRINIVIL,ZESTRIL Take 10 mg by mouth daily.   MULTIVITAMIN ADULT PO Take 1 tablet by mouth daily.   niacin 250 MG tablet Take 250 mg by mouth daily with breakfast.   nitroGLYCERIN 0.4 MG SL tablet Commonly known as:  NITROSTAT Place 0.4 mg under the tongue every 5 (five) minutes as needed for chest pain. May take up to 3 doses.   nortriptyline 10 MG capsule Commonly known as:  PAMELOR Take 10 mg by mouth at bedtime.   tamsulosin 0.4 MG Caps capsule Commonly known as:  FLOMAX Take 0.4 mg by mouth.   TUMS PO Take 500 mg by mouth daily as needed (heartburn).   vitamin B-12 1000 MCG tablet Commonly known as:  CYANOCOBALAMIN Take 1,000 mcg by mouth daily.       Discharge Instructions: Please refer to Patient Instructions section of EMR for full details.  Patient was counseled important signs and symptoms that should  prompt return to medical care, changes in medications, dietary instructions, activity restrictions, and follow up appointments.   Follow-Up Appointments:   Rory Percy, DO 05/27/2017, 12:12 PM PGY-1, [place]

## 2017-05-24 NOTE — Progress Notes (Signed)
  Echocardiogram 2D Echocardiogram has been performed.  Darlina Sicilian M 05/24/2017, 8:39 AM

## 2017-05-24 NOTE — Progress Notes (Signed)
Family Medicine Teaching Service Daily Progress Note Intern Pager: [phone]  Patient name: Donald [name] Medical record number: [id number] Date of birth: Jun 30, 1956 Age: 61 y.o. Gender: male  Primary Care Provider: [name], MD Consultants: Cardiology  Code Status:DNR    Pt Overview and Major Events to Date:    Assessment and Plan: Donald [name] is a 61 y.o. male presenting with loss of appetite, general malaise, intermittent chest pain and LLQ pain. PMH is significant for CAD, CHF, GERD, h/o stroke 2015, hypothyroidism.  Chest pain w/ h/o CHF, CAD - ACS r/o: Heart Score 4. Chronic atypical central chest pain intermittently with and without exertion. H/o CAD, CHF follows with Dr. [name] at [place] with last visit 02/2016. Cardiac cath in 08/2007 with 40% left main disease, 100% mid LAD, 90% ostial LCX dz with small distal vessel. Stress echo 03/2011 LVEF 45%, small area of distal lateral inferior ischemia. istat trop in ED 0.00 x2. EKG NSR with possible previous anterolateral infarct. CXR negative.  - Troponin 0.06> 0.07  -Cardiology consulted - risk stratification labs: A1c, lipid panel pending  - continuous pulse ox - home ASA 81mg , Lipitor 40mg , Carvedilol 3.125mg  BID, gabapentin 100mg  BID, Imdur 30mg , - hold home Lasix, Lisinopril due to AKI - restart plavix  - tylenol PRN - zofran PRN - supplemental O2 to keep sats >92%  Resolved LLQ abdominal pain: No abdominal pain, slight umbilical tenderness, chronic in nature for patient. S/p 2L NS bolus in ED. Afebrile but slightly tachycardic to 103 in ED. H/o hernia surgery 2 years ago. No change in bowel habits, last BM was yesterday so don't suspect incarceration. Lactic acid 1.2/1.3. CT A/P with mild fluid-filled distention of jejunal loops question enteritis - Granuloma noted on liver - not noted in 2016, could consider outpatient follow up as needed  - Given lack of diverticulitis consider D/C Meropenem - f/u blood  culture pending  - Diet given - zofran PRN - miralax PRN  AKI: likely prerenal in the setting of poor po. Cr 2.66 >1.65, baseline ~1.2-1.3. Received 2 L of NS bolus  - continue holding home lisinopril, lasix- if patient to head home will restart at follow up appointment  - PO intake    Prior h/o stroke, 2015 With subsequent deficits of slowed/slurred speech with difficulties swallowing and R UE weakness. 5/5 strength globally on exam today. Did not note significant slurred speech today. Bedside swallow wnl - continue ASA 81mg   Hypothyroidism Previously noted although not on medication at home. TSH 0.6  - No need for medication   ?BPH: On tamsulosin at home.  No history of is taking. -We will monitor I&O -Bladder scan if concerning  FEN/GI: NPO until stroke swallow screen  Prophylaxis: heparin per pharm  Disposition: admit to inpatient, attending Dr. Erin Hearing  Subjective:  Patient would like to go home. Pain has completely resolved. Patient would be willing to eat, states he has a strong appetite. Discussed with patient discharge is possibly later this afternoon   Objective: Temp:  [97.2 F (36.2 C)-98.5 F (36.9 C)] 98.5 F (36.9 C) (11/25 0840) Pulse Rate:  [78-120] 103 (11/25 0840) Resp:  [11-22] 18 (11/25 0840) BP: (106-148)/(52-95) 126/67 (11/25 0840) SpO2:  [95 %-100 %] 99 % (11/25 0840) Weight:  [155 lb (70.3 kg)] 155 lb (70.3 kg) (11/24 1224) Physical Exam: General: Well appearing elderly man  Cardiovascular: RRR, no murmurs, rubs Respiratory: CTAB, normal WOB  Abdomen: Slight umbilical tenderness, BS+, no umbilical hernia noted,  Extremities:  No lower extremity edema   Laboratory: Recent Labs  Lab 05/23/17 1225 05/23/17 2322 05/24/17 0556  WBC 11.1* 11.0* 9.4  HGB 14.2 12.1* 11.9*  HCT 42.7 36.3* 36.0*  PLT 272 231 230   Recent Labs  Lab 05/23/17 1225 05/23/17 2322 05/24/17 0556  NA 136  --  136  K 4.7  --  4.7  CL 103  --  107  CO2 22   --  21*  BUN 31*  --  20  CREATININE 2.66* 1.88* 1.65*  CALCIUM 9.5  --  8.8*  GLUCOSE 115*  --  95     Imaging/Diagnostic Tests: Ct Abdomen Pelvis Wo Contrast  Result Date: 05/23/2017 CLINICAL DATA:  Lower abdominal pain for 2 days. EXAM: CT ABDOMEN AND PELVIS WITHOUT CONTRAST TECHNIQUE: Multidetector CT imaging of the abdomen and pelvis was performed following the standard protocol without IV contrast. COMPARISON:  08/12/2016 FINDINGS: Lower chest: Normal heart size. Clear lung bases. Small hiatal hernia. Hepatobiliary: Small granuloma in the right hepatic dome. Status post cholecystectomy. No biliary dilatation or mass identified given limitations of a noncontrast study. Pancreas: No ductal dilatation or mass.  No inflammation. Spleen: Normal Adrenals/Urinary Tract: Normal bilateral adrenal glands. 11 mm hyperdensity in the posterior aspect of the left kidney would be in keeping with a complex cyst possibly proteinaceous or hemorrhagic cyst. Further characterization not possible given lack of IV contrast. No significant change in size since prior. Physiologically distended urinary bladder without focal mural thickening or calculus. Stomach/Bowel: Small hiatal hernia. Physiologically distended stomach with normal small bowel rotation. Small duodenal diverticulum off the second portion. Mild fluid-filled distention of jejunum with slight mural thickening raises the possibility of an enteritis. No bowel obstruction is seen as contrast extends to the level of the rectum. Normal terminal and distal ileum. Status post appendectomy. Vascular/Lymphatic: Moderate aortoiliac atherosclerosis without aneurysm. No lymphadenopathy. Reproductive: Prostate is unremarkable. Other: None Musculoskeletal: Facet arthropathy of the lumbar spine. No acute nor suspicious osseous lesions. Bridging osteophytes across the sacroiliac joints as before. IMPRESSION: 1. Mild fluid-filled distention of jejunal loops question  enteritis. No mechanical obstruction. 2. Hyperdense 11 mm cyst noted of the interpolar left kidney unchanged in size possibly representing a proteinaceous or hemorrhagic cyst. 3. Right hepatic dome granuloma. 4. Lumbar facet arthropathy. 5. Aortoiliac atherosclerosis. Electronically Signed   By: Ashley Royalty M.D.   On: 05/23/2017 21:45   Dg Chest 2 View  Result Date: 05/23/2017 CLINICAL DATA:  61 year old male with recent onset left-sided chest pain. EXAM: CHEST  2 VIEW COMPARISON:  06/28/2014 FINDINGS: The heart size and mediastinal contours are within normal limits. There is bibasilar atelectasis.  Both lungs are otherwise clear. The visualized skeletal structures are unremarkable. IMPRESSION: Bibasilar atelectasis without acute intrathoracic pathology. Electronically Signed   By: Kristopher Oppenheim M.D.   On: 05/23/2017 13:45    Tonette Bihari, MD 05/24/2017, 9:02 AM PGY-3, [place] Intern pager: [phone], text pages welcome

## 2017-05-26 ENCOUNTER — Telehealth: Payer: Self-pay | Admitting: *Deleted

## 2017-05-26 NOTE — Telephone Encounter (Signed)
-----   Message from [name], MD sent at 05/25/2017 11:03 AM EST ----- Hi Team,  Donald [name] was briefly hospitalized over thanksgiving for chest pain. It looks like he wasn't scheduled for a hospital follow up, and I doubt I have anything before maternity leave. Could we call and schedule him a hospital follow up with someone on white team? Thanks,  Anda Kraft

## 2017-05-26 NOTE — Telephone Encounter (Signed)
Tried to contact pt to see about getting him in for a hospital follow up and phone rang one time and then said VM had not been set up.  If pt calls back please assist him in getting this scheduled Katharina Caper, Mohab Ashby D, CMA

## 2017-05-28 DIAGNOSIS — E7800 Pure hypercholesterolemia, unspecified: Secondary | ICD-10-CM | POA: Diagnosis not present

## 2017-05-28 DIAGNOSIS — I255 Ischemic cardiomyopathy: Secondary | ICD-10-CM | POA: Diagnosis not present

## 2017-05-28 DIAGNOSIS — I10 Essential (primary) hypertension: Secondary | ICD-10-CM | POA: Diagnosis not present

## 2017-05-28 DIAGNOSIS — I2510 Atherosclerotic heart disease of native coronary artery without angina pectoris: Secondary | ICD-10-CM | POA: Diagnosis not present

## 2017-05-28 LAB — CULTURE, BLOOD (ROUTINE X 2)
Culture: NO GROWTH
Culture: NO GROWTH
Special Requests: ADEQUATE
Special Requests: ADEQUATE

## 2017-05-28 NOTE — Telephone Encounter (Signed)
Tried once again to schedule appoint for patient. There was no answer and the voice mail still had not been set up.Donald [name]

## 2017-06-03 ENCOUNTER — Encounter: Payer: Self-pay | Admitting: Internal Medicine

## 2017-06-03 ENCOUNTER — Ambulatory Visit: Payer: Medicare HMO | Admitting: Internal Medicine

## 2017-06-03 ENCOUNTER — Encounter: Payer: Self-pay | Admitting: Licensed Clinical Social Worker

## 2017-06-03 VITALS — BP 100/60 | HR 81 | Temp 97.8°F | Wt 143.8 lb

## 2017-06-03 DIAGNOSIS — Z1211 Encounter for screening for malignant neoplasm of colon: Secondary | ICD-10-CM | POA: Diagnosis not present

## 2017-06-03 DIAGNOSIS — F329 Major depressive disorder, single episode, unspecified: Secondary | ICD-10-CM

## 2017-06-03 DIAGNOSIS — R4589 Other symptoms and signs involving emotional state: Secondary | ICD-10-CM

## 2017-06-03 DIAGNOSIS — R7989 Other specified abnormal findings of blood chemistry: Secondary | ICD-10-CM | POA: Diagnosis not present

## 2017-06-03 MED ORDER — SERTRALINE HCL 50 MG PO TABS: 50.0000 mg | ORAL_TABLET | Freq: Every day | ORAL | 0 refills | Status: DC

## 2017-06-03 NOTE — Progress Notes (Signed)
[facility] Progress Note  Subjective:  Donald [name] is a 61 y.o. male with history of CAD and CVA in 2015 who presents for follow-up after hospitalization. His wife is also present. He was admitted from 11/24-11/25/18 for ACS rule-out, as well as loss of appetite, general malaise, and LLQ pain (resolved). He has since followed up with his Cardiologist and reports no changes made. Chief complaint today is that he is "not himself." Wife says patient has not been able to define what he means by this despite her efforts to figure out what is bothering him. Patient is occasionally tearful and reports he has been feeling this way for some time, at least a couple years. He has experienced death of family members, lost his job in the [employer] which had been his livelihood for 35 years, and had left-sided deficits after his stroke though denies any presently. Wife notes he sometimes has a shuffling gait but denies change in voice, pill-rolling tremor or small handwriting.  ROS: No diarrhea, no chest pain   Allergies  Allergen Reactions  . Penicillins Shortness Of Breath and Rash  . Sulfa Antibiotics Hives, Shortness Of Breath and Rash  . Tramadol Nausea And Vomiting    Social History   Tobacco Use  . Smoking status: Former Smoker    Types: Cigarettes  . Smokeless tobacco: Never Used  Substance Use Topics  . Alcohol use: No    Objective: Blood pressure 100/60, pulse 81, temperature 97.8 F (36.6 C), temperature source Oral, weight 143 lb 12.8 oz (65.2 kg), SpO2 97 %. Body mass index is 24.68 kg/m. Constitutional: Thin male in NAD, appears older than stated age Cardiovascular: RRR, S1, S2, no m/r/g.  Pulmonary/Chest: Effort normal and breath sounds normal.  Abdominal: Soft. +BS, NT Neurological: AOx3, no focal deficits. Slow but normal gait. Strength 5/5 of upper and LEs bilaterally.  Psychiatric: Flat affect. Occasionally tearful.  Vitals reviewed  Depression  screen [facility] 2/9 06/03/2017 09/29/2016 08/20/2016  Decreased Interest 3 0 0  Down, Depressed, Hopeless 3 0 0  PHQ - 2 Score 6 0 0  Altered sleeping 1 - -  Tired, decreased energy 3 - -  Change in appetite 1 - -  Feeling bad or failure about yourself  3 - -  Trouble concentrating 3 - -  Moving slowly or fidgety/restless 3 - -  Suicidal thoughts 0 - -  PHQ-9 Score 20 - -    Assessment/Plan: Depressed mood - Patient here for hospital follow-up and found to have severe range score for depression; screened due to general malaise. Stressors include stroke in 2015, lack of employment, and deaths in family.  - Patient interested in both counseling in medication. - Prescribed zoloft 50 mg. Counseled on reasons to stop (worsening mood, SI). Patient is on nortriptyline for headaches but very small dose of 10 mg QHS so do not believe there is much of any risk for serotonin syndrome--counseled on symptoms, e.g. agitation, sweating, diarrhea.  - Introduced to [facility]. Plans to return to [facility] at least initially for counseling (wife seen by Casimer Lanius, LCSW, so will see another provider)  Ordered BMP to f/u AKI from hospitalization.   HM: Provided stool cards for colon cancer screening.   Follow-up in 2-3 weeks to discuss how he is doing with medication.  Olene Floss, MD [place], PGY-3

## 2017-06-03 NOTE — Patient Instructions (Signed)
Donald [name],  I recommend starting zoloft 50 mg for depression. Please stop this immediately if you have worsening mood or thoughts of hurting yourself. Please follow-up in 2-3 weeks to see how you are doing. I am glad to hear you are interested in therapy, as well.  I will call with lab results.  Best, Dr. Ola Spurr

## 2017-06-04 LAB — BASIC METABOLIC PANEL
BUN / CREAT RATIO: 12 (ref 10–24)
BUN: 19 mg/dL (ref 8–27)
CO2: 26 mmol/L (ref 20–29)
CREATININE: 1.55 mg/dL — AB (ref 0.76–1.27)
Calcium: 9.6 mg/dL (ref 8.6–10.2)
Chloride: 99 mmol/L (ref 96–106)
GFR, EST AFRICAN AMERICAN: 55 mL/min/{1.73_m2} — AB (ref >59)
GFR, EST NON AFRICAN AMERICAN: 47 mL/min/{1.73_m2} — AB (ref >59)
Glucose: 94 mg/dL (ref 65–99)
Potassium: 5.3 mmol/L — ABNORMAL HIGH (ref 3.5–5.2)
SODIUM: 138 mmol/L (ref 134–144)

## 2017-06-04 NOTE — Telephone Encounter (Signed)
Pt was seen in clinic on 06/03/17. Katharina Caper, Tymia Streb D, [place]

## 2017-06-04 NOTE — Progress Notes (Signed)
  Type of Service: [place] warm handoff  Estimate time:15 minutes : Interpreter:No.  SUBJECTIVE: Donald [name] is a 61 y.o. male referred by Dr. Ola Spurr for assistance with managing symptoms of  depression.  Patient was accompanied by his wife.  Patient is very quite and looks to wife for answers.  Reports he is interesting in ongoing therapy, however would like to start with in house [facility] . LIFE CONTEXT:  [place] lives with his wife   School / Work /Fun: does not work  Life changes: none reported  GOALS: Patient will reduce symptoms of: depression, and increase  ability EX:NTZGYF skills and self-management skills,  INTERVENTION:  Data processing manager ;  PHQ 9=20, indication of: severe depression.   ISSUES DISCUSSED: Integrated care services, support system, community resource for ongoing therapy. ASSESSMENT:Patient currently experiencing symptoms of depression. PCP started patient on zoloft today.  Patient may benefit from, and is in agreement to receive further assessment and brief therapeutic interventions to assist with managing his symptoms.  PLAN: Patient will schedule an appointment with [facility] for a full assessment.  Warm Hand Off Completed.     Casimer Lanius, LCSW Licensed Clinical Social Worker [facility]   [phone] 8:26 AM

## 2017-06-06 ENCOUNTER — Encounter: Payer: Self-pay | Admitting: Internal Medicine

## 2017-06-06 DIAGNOSIS — R4589 Other symptoms and signs involving emotional state: Secondary | ICD-10-CM | POA: Insufficient documentation

## 2017-06-06 DIAGNOSIS — F329 Major depressive disorder, single episode, unspecified: Principal | ICD-10-CM

## 2017-06-06 NOTE — Assessment & Plan Note (Signed)
-   Patient here for hospital follow-up and found to have severe range score for depression; screened due to general malaise. Stressors include stroke in 2015, lack of employment, and deaths in family.  - Patient interested in both counseling in medication. - Prescribed zoloft 50 mg. Counseled on reasons to stop (worsening mood, SI). Patient is on nortriptyline for headaches but very small dose of 10 mg QHS so do not believe there is much of any risk for serotonin syndrome--counseled on symptoms, e.g. agitation, sweating, diarrhea.  - Introduced to [facility]. Plans to return to [facility] at least initially for counseling (wife seen by Casimer Lanius, LCSW, so will see another provider)

## 2017-06-10 ENCOUNTER — Emergency Department (HOSPITAL_COMMUNITY): Payer: Medicare HMO

## 2017-06-10 ENCOUNTER — Ambulatory Visit (INDEPENDENT_AMBULATORY_CARE_PROVIDER_SITE_OTHER): Payer: Medicare HMO | Admitting: Student in an Organized Health Care Education/Training Program

## 2017-06-10 ENCOUNTER — Encounter: Payer: Self-pay | Admitting: Student in an Organized Health Care Education/Training Program

## 2017-06-10 ENCOUNTER — Other Ambulatory Visit: Payer: Self-pay

## 2017-06-10 ENCOUNTER — Inpatient Hospital Stay (HOSPITAL_COMMUNITY)
Admission: EM | Admit: 2017-06-10 | Discharge: 2017-06-12 | DRG: 065 | Disposition: A | Discharge status: Home / Self Care | Payer: Medicare HMO | Attending: Family Medicine | Admitting: Family Medicine

## 2017-06-10 ENCOUNTER — Encounter: Payer: Self-pay | Admitting: Internal Medicine

## 2017-06-10 ENCOUNTER — Encounter (HOSPITAL_COMMUNITY): Payer: Self-pay

## 2017-06-10 VITALS — BP 100/60 | HR 93 | Temp 97.8°F | Ht 64.0 in | Wt 142.4 lb

## 2017-06-10 DIAGNOSIS — Z87891 Personal history of nicotine dependence: Secondary | ICD-10-CM

## 2017-06-10 DIAGNOSIS — Z880 Allergy status to penicillin: Secondary | ICD-10-CM

## 2017-06-10 DIAGNOSIS — Z7982 Long term (current) use of aspirin: Secondary | ICD-10-CM | POA: Diagnosis not present

## 2017-06-10 DIAGNOSIS — Z882 Allergy status to sulfonamides status: Secondary | ICD-10-CM | POA: Diagnosis not present

## 2017-06-10 DIAGNOSIS — Z8673 Personal history of transient ischemic attack (TIA), and cerebral infarction without residual deficits: Secondary | ICD-10-CM | POA: Diagnosis present

## 2017-06-10 DIAGNOSIS — I6320 Cerebral infarction due to unspecified occlusion or stenosis of unspecified precerebral arteries: Secondary | ICD-10-CM | POA: Diagnosis not present

## 2017-06-10 DIAGNOSIS — I5030 Unspecified diastolic (congestive) heart failure: Secondary | ICD-10-CM | POA: Diagnosis not present

## 2017-06-10 DIAGNOSIS — G912 (Idiopathic) normal pressure hydrocephalus: Secondary | ICD-10-CM | POA: Diagnosis not present

## 2017-06-10 DIAGNOSIS — Z888 Allergy status to other drugs, medicaments and biological substances status: Secondary | ICD-10-CM

## 2017-06-10 DIAGNOSIS — Z7902 Long term (current) use of antithrombotics/antiplatelets: Secondary | ICD-10-CM | POA: Diagnosis not present

## 2017-06-10 DIAGNOSIS — I2510 Atherosclerotic heart disease of native coronary artery without angina pectoris: Secondary | ICD-10-CM | POA: Diagnosis present

## 2017-06-10 DIAGNOSIS — R251 Tremor, unspecified: Secondary | ICD-10-CM | POA: Diagnosis present

## 2017-06-10 DIAGNOSIS — K219 Gastro-esophageal reflux disease without esophagitis: Secondary | ICD-10-CM | POA: Diagnosis not present

## 2017-06-10 DIAGNOSIS — I6300 Cerebral infarction due to thrombosis of unspecified precerebral artery: Secondary | ICD-10-CM

## 2017-06-10 DIAGNOSIS — E538 Deficiency of other specified B group vitamins: Secondary | ICD-10-CM | POA: Diagnosis present

## 2017-06-10 DIAGNOSIS — Z9049 Acquired absence of other specified parts of digestive tract: Secondary | ICD-10-CM | POA: Diagnosis not present

## 2017-06-10 DIAGNOSIS — H53461 Homonymous bilateral field defects, right side: Secondary | ICD-10-CM | POA: Diagnosis present

## 2017-06-10 DIAGNOSIS — I5042 Chronic combined systolic (congestive) and diastolic (congestive) heart failure: Secondary | ICD-10-CM | POA: Diagnosis present

## 2017-06-10 DIAGNOSIS — R4182 Altered mental status, unspecified: Secondary | ICD-10-CM | POA: Diagnosis not present

## 2017-06-10 DIAGNOSIS — K5790 Diverticulosis of intestine, part unspecified, without perforation or abscess without bleeding: Secondary | ICD-10-CM | POA: Diagnosis present

## 2017-06-10 DIAGNOSIS — D649 Anemia, unspecified: Secondary | ICD-10-CM | POA: Diagnosis present

## 2017-06-10 DIAGNOSIS — N179 Acute kidney failure, unspecified: Secondary | ICD-10-CM | POA: Diagnosis present

## 2017-06-10 DIAGNOSIS — I480 Paroxysmal atrial fibrillation: Secondary | ICD-10-CM | POA: Diagnosis present

## 2017-06-10 DIAGNOSIS — I671 Cerebral aneurysm, nonruptured: Secondary | ICD-10-CM | POA: Diagnosis present

## 2017-06-10 DIAGNOSIS — I69351 Hemiplegia and hemiparesis following cerebral infarction affecting right dominant side: Secondary | ICD-10-CM | POA: Diagnosis not present

## 2017-06-10 DIAGNOSIS — I110 Hypertensive heart disease with heart failure: Secondary | ICD-10-CM | POA: Diagnosis present

## 2017-06-10 DIAGNOSIS — I63332 Cerebral infarction due to thrombosis of left posterior cerebral artery: Principal | ICD-10-CM | POA: Diagnosis present

## 2017-06-10 DIAGNOSIS — E871 Hypo-osmolality and hyponatremia: Secondary | ICD-10-CM | POA: Diagnosis not present

## 2017-06-10 DIAGNOSIS — R29703 NIHSS score 3: Secondary | ICD-10-CM | POA: Diagnosis present

## 2017-06-10 DIAGNOSIS — F0150 Vascular dementia without behavioral disturbance: Secondary | ICD-10-CM | POA: Diagnosis present

## 2017-06-10 DIAGNOSIS — R9431 Abnormal electrocardiogram [ECG] [EKG]: Secondary | ICD-10-CM | POA: Diagnosis not present

## 2017-06-10 DIAGNOSIS — E785 Hyperlipidemia, unspecified: Secondary | ICD-10-CM | POA: Diagnosis present

## 2017-06-10 DIAGNOSIS — G9389 Other specified disorders of brain: Secondary | ICD-10-CM | POA: Diagnosis present

## 2017-06-10 DIAGNOSIS — E039 Hypothyroidism, unspecified: Secondary | ICD-10-CM | POA: Diagnosis present

## 2017-06-10 DIAGNOSIS — R270 Ataxia, unspecified: Secondary | ICD-10-CM | POA: Diagnosis not present

## 2017-06-10 LAB — COMPREHENSIVE METABOLIC PANEL
ALT: 18 U/L (ref 17–63)
ANION GAP: 11 (ref 5–15)
AST: 23 U/L (ref 15–41)
Albumin: 3.6 g/dL (ref 3.5–5.0)
Alkaline Phosphatase: 19 U/L — ABNORMAL LOW (ref 38–126)
BUN: 21 mg/dL — ABNORMAL HIGH (ref 6–20)
CHLORIDE: 101 mmol/L (ref 101–111)
CO2: 22 mmol/L (ref 22–32)
Calcium: 9.1 mg/dL (ref 8.9–10.3)
Creatinine, Ser: 1.5 mg/dL — ABNORMAL HIGH (ref 0.61–1.24)
GFR, EST AFRICAN AMERICAN: 56 mL/min — AB (ref >60)
GFR, EST NON AFRICAN AMERICAN: 48 mL/min — AB (ref >60)
Glucose, Bld: 100 mg/dL — ABNORMAL HIGH (ref 65–99)
POTASSIUM: 3.8 mmol/L (ref 3.5–5.1)
SODIUM: 134 mmol/L — AB (ref 135–145)
Total Bilirubin: 0.9 mg/dL (ref 0.3–1.2)
Total Protein: 6.7 g/dL (ref 6.5–8.1)

## 2017-06-10 LAB — CBC
HEMATOCRIT: 35.1 % — AB (ref 39.0–52.0)
HEMOGLOBIN: 11.8 g/dL — AB (ref 13.0–17.0)
MCH: 29.1 pg (ref 26.0–34.0)
MCHC: 33.6 g/dL (ref 30.0–36.0)
MCV: 86.5 fL (ref 78.0–100.0)
PLATELETS: 358 10*3/uL (ref 150–400)
RBC: 4.06 MIL/uL — AB (ref 4.22–5.81)
RDW: 12.6 % (ref 11.5–15.5)
WBC: 8.4 10*3/uL (ref 4.0–10.5)

## 2017-06-10 NOTE — Patient Instructions (Signed)
It was a pleasure seeing you today in our clinic.  Please go straight to the ED for further workup.  Our clinic's number is [phone]. Please call with questions or concerns about what we discussed today.  Be well, Dr. Burr Medico

## 2017-06-10 NOTE — ED Triage Notes (Signed)
Pt sent to ED by pcp d/t AMS with unknown etiology. Pt wife reports decline in mentation, shuffling gait, tremors in hands, poor coordination that began ~1 week ago. PCP suggesting labs, head ct, UA, and UDS. According to wife pt completely independent prior to thanksgiving, had some decline in mentation leading to hospitalization, but rapidly worsened over the last week. PT oriented to person and place. Slow to respond.

## 2017-06-10 NOTE — Assessment & Plan Note (Addendum)
Uncertain etiology. Considering UTI, though patient unable to give urine despite 3 attempts in the office. Given significant decline in mentation, clumsiness, rapid onset of shuffling gait, history of CAD and stroke, it is necessary for patient to go to ED for further workup. - checked orthostatics which were positive as noted above - Would consider workup including UA, UDS (unable to get in office), blood work, and head imaging

## 2017-06-10 NOTE — Progress Notes (Signed)
   CC: AMS  HPI: Donald [name] is a 61 y.o. male with PMH significant for CAD, cerebral aneurysm, history of CVA who presents to [facility] today with altered mentation of 2-3 weeks duration.   Not acting like himself/Altered mentation Patient was seen in our clinic one week ago for similar complaint at which time he was thought to be having an episode of depression and he was started on Zoloft 50 mg daily about one week ago.   Patient's wife reports patient was acting like himself up until 11/22,after which time he was noted to have shuffling gait, be repeating himself frequently (asking the same questions every day, also repeating "i'm sorry" over and over) and seems clumsier than usual. Of note patient does have a history of stroke in the past with deviated tongue as residual deficit. He has recent life stressors but does not have a history of depression in the past per his wife. She feels he has had trembling hands, but no pill-rolling tremor.  He denies changes in urination, no urinary frequency or urgency. No known drug ingestion.  Patient's wife reports he was completing ADL's and able to drive himself around up until 11/22. 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   Review of Symptoms:  See HPI for ROS.   CC, SH/smoking status, and VS noted.  Objective: BP 100/60   Pulse 93   Temp 97.8 F (36.6 C) (Oral)   Ht 5\' 4"  (1.626 m)   Wt 142 lb 6.4 oz (64.6 kg)   SpO2 96%   BMI 24.44 kg/m  GEN: NAD, alert, cooperative EYE: no conjunctival injection, pupils equally round and reactive to light RESPIRATORY: clear to auscultation bilaterally with no wheezes, rhonchi or rales, good effort CV: RRR, no m/r/g, no peripheral edema GI: soft, non-tender, non-distended NEURO: +tongue deviatation (chronic per wife), +shuffling gait, nearly turns into the wall when trying to get back into the exam room but did get htrough the door. Answers with 1-2 word answers.  PERRL, EOMI,  II-XII otherwise intact, 5/5 strength in 4 extremities PSYCH: flattened affect  Depression screen [facility] 2/9 06/10/2017 06/03/2017  Decreased Interest 2 3  Down, Depressed, Hopeless 1 3  PHQ - 2 Score 3 6  Altered sleeping 2 1  Tired, decreased energy 2 3  Change in appetite 3 1  Feeling bad or failure about yourself  2 3  Trouble concentrating 3 3  Moving slowly or fidgety/restless 3 3  Suicidal thoughts 0 0  PHQ-9 Score 18 20    Orthostatics:   Orthostatic VS for the past 24 hrs:  BP- Lying Pulse- Lying BP- Sitting Pulse- Sitting BP- Standing at 0 minutes Pulse- Standing at 0 minutes  06/10/17 1727 108/72 83 98/56 80 90/54 96  06/10/17 1713 108/72 83 98/56 80 90/54 96    Assessment and plan:  Altered mental status Uncertain etiology. Considering UTI, though patient unable to give urine despite 3 attempts in the office. Given significant decline in mentation, clumsiness, rapid onset of shuffling gait, history of CAD and stroke, it is necessary for patient to go to ED for further workup. - checked orthostatics which were positive as noted above - Would consider workup including UA, UDS (unable to get in office), blood work, and head imaging    Orders Placed This Encounter  Procedures  . POCT urinalysis dipstick   Everrett Coombe, MD,MS,  PGY2 06/10/2017 5:30 PM

## 2017-06-11 ENCOUNTER — Inpatient Hospital Stay (HOSPITAL_COMMUNITY): Payer: Medicare HMO

## 2017-06-11 ENCOUNTER — Encounter (HOSPITAL_COMMUNITY): Payer: Self-pay | Admitting: General Practice

## 2017-06-11 ENCOUNTER — Other Ambulatory Visit: Payer: Self-pay

## 2017-06-11 DIAGNOSIS — G9389 Other specified disorders of brain: Secondary | ICD-10-CM | POA: Diagnosis not present

## 2017-06-11 DIAGNOSIS — I110 Hypertensive heart disease with heart failure: Secondary | ICD-10-CM | POA: Diagnosis present

## 2017-06-11 DIAGNOSIS — R29703 NIHSS score 3: Secondary | ICD-10-CM | POA: Diagnosis present

## 2017-06-11 DIAGNOSIS — N179 Acute kidney failure, unspecified: Secondary | ICD-10-CM | POA: Diagnosis present

## 2017-06-11 DIAGNOSIS — I63332 Cerebral infarction due to thrombosis of left posterior cerebral artery: Secondary | ICD-10-CM

## 2017-06-11 DIAGNOSIS — R4182 Altered mental status, unspecified: Secondary | ICD-10-CM | POA: Diagnosis present

## 2017-06-11 DIAGNOSIS — Z9049 Acquired absence of other specified parts of digestive tract: Secondary | ICD-10-CM | POA: Diagnosis not present

## 2017-06-11 DIAGNOSIS — I5030 Unspecified diastolic (congestive) heart failure: Secondary | ICD-10-CM | POA: Diagnosis not present

## 2017-06-11 DIAGNOSIS — Z888 Allergy status to other drugs, medicaments and biological substances status: Secondary | ICD-10-CM | POA: Diagnosis not present

## 2017-06-11 DIAGNOSIS — K219 Gastro-esophageal reflux disease without esophagitis: Secondary | ICD-10-CM | POA: Diagnosis present

## 2017-06-11 DIAGNOSIS — Z880 Allergy status to penicillin: Secondary | ICD-10-CM | POA: Diagnosis not present

## 2017-06-11 DIAGNOSIS — E538 Deficiency of other specified B group vitamins: Secondary | ICD-10-CM | POA: Diagnosis present

## 2017-06-11 DIAGNOSIS — Z7982 Long term (current) use of aspirin: Secondary | ICD-10-CM | POA: Diagnosis not present

## 2017-06-11 DIAGNOSIS — I2510 Atherosclerotic heart disease of native coronary artery without angina pectoris: Secondary | ICD-10-CM | POA: Diagnosis present

## 2017-06-11 DIAGNOSIS — Z7902 Long term (current) use of antithrombotics/antiplatelets: Secondary | ICD-10-CM | POA: Diagnosis not present

## 2017-06-11 DIAGNOSIS — G912 (Idiopathic) normal pressure hydrocephalus: Secondary | ICD-10-CM | POA: Diagnosis present

## 2017-06-11 DIAGNOSIS — I6300 Cerebral infarction due to thrombosis of unspecified precerebral artery: Secondary | ICD-10-CM | POA: Diagnosis not present

## 2017-06-11 DIAGNOSIS — K5790 Diverticulosis of intestine, part unspecified, without perforation or abscess without bleeding: Secondary | ICD-10-CM | POA: Diagnosis present

## 2017-06-11 DIAGNOSIS — I671 Cerebral aneurysm, nonruptured: Secondary | ICD-10-CM | POA: Diagnosis present

## 2017-06-11 DIAGNOSIS — I5042 Chronic combined systolic (congestive) and diastolic (congestive) heart failure: Secondary | ICD-10-CM | POA: Diagnosis present

## 2017-06-11 DIAGNOSIS — E871 Hypo-osmolality and hyponatremia: Secondary | ICD-10-CM | POA: Diagnosis present

## 2017-06-11 DIAGNOSIS — E039 Hypothyroidism, unspecified: Secondary | ICD-10-CM | POA: Diagnosis present

## 2017-06-11 DIAGNOSIS — Z8673 Personal history of transient ischemic attack (TIA), and cerebral infarction without residual deficits: Secondary | ICD-10-CM | POA: Diagnosis present

## 2017-06-11 DIAGNOSIS — D649 Anemia, unspecified: Secondary | ICD-10-CM | POA: Diagnosis present

## 2017-06-11 DIAGNOSIS — I69351 Hemiplegia and hemiparesis following cerebral infarction affecting right dominant side: Secondary | ICD-10-CM | POA: Diagnosis not present

## 2017-06-11 DIAGNOSIS — Z87891 Personal history of nicotine dependence: Secondary | ICD-10-CM | POA: Diagnosis not present

## 2017-06-11 DIAGNOSIS — R251 Tremor, unspecified: Secondary | ICD-10-CM | POA: Diagnosis present

## 2017-06-11 DIAGNOSIS — Z882 Allergy status to sulfonamides status: Secondary | ICD-10-CM | POA: Diagnosis not present

## 2017-06-11 DIAGNOSIS — H53461 Homonymous bilateral field defects, right side: Secondary | ICD-10-CM | POA: Diagnosis present

## 2017-06-11 LAB — URINALYSIS, ROUTINE W REFLEX MICROSCOPIC
BILIRUBIN URINE: NEGATIVE
Glucose, UA: NEGATIVE mg/dL
Hgb urine dipstick: NEGATIVE
KETONES UR: 20 mg/dL — AB
Leukocytes, UA: NEGATIVE
NITRITE: NEGATIVE
PH: 5 (ref 5.0–8.0)
PROTEIN: NEGATIVE mg/dL
Specific Gravity, Urine: 1.028 (ref 1.005–1.030)

## 2017-06-11 LAB — LIPID PANEL
CHOLESTEROL: 64 mg/dL (ref 0–200)
HDL: 26 mg/dL — ABNORMAL LOW (ref >40)
LDL Cholesterol: 29 mg/dL (ref 0–99)
TRIGLYCERIDES: 46 mg/dL (ref <150)
Total CHOL/HDL Ratio: 2.5 RATIO
VLDL: 9 mg/dL (ref 0–40)

## 2017-06-11 LAB — RAPID URINE DRUG SCREEN, HOSP PERFORMED
AMPHETAMINES: NOT DETECTED
BARBITURATES: NOT DETECTED
Benzodiazepines: NOT DETECTED
Cocaine: NOT DETECTED
Opiates: NOT DETECTED
TETRAHYDROCANNABINOL: NOT DETECTED

## 2017-06-11 LAB — TROPONIN I: Troponin I: <0.03 ng/mL (ref <0.03)

## 2017-06-11 LAB — PROTIME-INR
INR: 1.11
PROTHROMBIN TIME: 14.2 s (ref 11.4–15.2)

## 2017-06-11 LAB — C DIFFICILE QUICK SCREEN W PCR REFLEX
C DIFFICILE (CDIFF) INTERP: NOT DETECTED
C DIFFICILE (CDIFF) TOXIN: NEGATIVE
C DIFFICLE (CDIFF) ANTIGEN: NEGATIVE

## 2017-06-11 LAB — CBG MONITORING, ED: GLUCOSE-CAPILLARY: 82 mg/dL (ref 65–99)

## 2017-06-11 LAB — ETHANOL

## 2017-06-11 LAB — APTT: aPTT: 31 seconds (ref 24–36)

## 2017-06-11 LAB — ECHOCARDIOGRAM COMPLETE
Height: 64 in
Weight: 2236.35 oz

## 2017-06-11 LAB — HEMOGLOBIN A1C
HEMOGLOBIN A1C: 5.4 % (ref 4.8–5.6)
MEAN PLASMA GLUCOSE: 108.28 mg/dL

## 2017-06-11 MED ORDER — CLOPIDOGREL BISULFATE 75 MG PO TABS
75.0000 mg | ORAL_TABLET | Freq: Every day | ORAL | Status: DC
  Administered 2017-06-11 – 2017-06-12 (×2): 75 mg via ORAL
  Filled 2017-06-11 (×2): qty 1

## 2017-06-11 MED ORDER — CARVEDILOL 3.125 MG PO TABS
3.1250 mg | ORAL_TABLET | Freq: Two times a day (BID) | ORAL | Status: DC
  Administered 2017-06-11 – 2017-06-12 (×3): 3.125 mg via ORAL
  Filled 2017-06-11 (×5): qty 1

## 2017-06-11 MED ORDER — SERTRALINE HCL 50 MG PO TABS
50.0000 mg | ORAL_TABLET | Freq: Every day | ORAL | Status: DC
  Administered 2017-06-11 – 2017-06-12 (×2): 50 mg via ORAL
  Filled 2017-06-11 (×2): qty 1

## 2017-06-11 MED ORDER — ATORVASTATIN CALCIUM 40 MG PO TABS
40.0000 mg | ORAL_TABLET | Freq: Every day | ORAL | Status: DC
  Administered 2017-06-11 – 2017-06-12 (×2): 40 mg via ORAL
  Filled 2017-06-11 (×2): qty 1

## 2017-06-11 MED ORDER — LISINOPRIL 10 MG PO TABS: 10.0000 mg | ORAL_TABLET | Freq: Every day | ORAL | Status: DC

## 2017-06-11 MED ORDER — SENNOSIDES-DOCUSATE SODIUM 8.6-50 MG PO TABS: 1.0000 | ORAL_TABLET | Freq: Every evening | ORAL | Status: DC | PRN

## 2017-06-11 MED ORDER — ENOXAPARIN SODIUM 40 MG/0.4ML ~~LOC~~ SOLN
40.0000 mg | SUBCUTANEOUS | Status: DC
  Administered 2017-06-11: 40 mg via SUBCUTANEOUS
  Filled 2017-06-11 (×2): qty 0.4

## 2017-06-11 MED ORDER — ASPIRIN EC 81 MG PO TBEC
81.0000 mg | DELAYED_RELEASE_TABLET | Freq: Every day | ORAL | Status: DC
  Filled 2017-06-11: qty 1

## 2017-06-11 MED ORDER — NORTRIPTYLINE HCL 10 MG PO CAPS
10.0000 mg | ORAL_CAPSULE | Freq: Every day | ORAL | Status: DC
  Administered 2017-06-11: 10 mg via ORAL
  Filled 2017-06-11 (×2): qty 1

## 2017-06-11 MED ORDER — ADULT MULTIVITAMIN W/MINERALS CH
1.0000 | ORAL_TABLET | Freq: Every day | ORAL | Status: DC
  Administered 2017-06-12: 1 via ORAL
  Filled 2017-06-11: qty 1

## 2017-06-11 MED ORDER — ASPIRIN EC 325 MG PO TBEC
325.0000 mg | DELAYED_RELEASE_TABLET | Freq: Every day | ORAL | Status: DC
  Administered 2017-06-11 – 2017-06-12 (×2): 325 mg via ORAL
  Filled 2017-06-11 (×2): qty 1

## 2017-06-11 MED ORDER — NIACIN 250 MG PO TABS
250.0000 mg | ORAL_TABLET | Freq: Every day | ORAL | Status: DC
  Administered 2017-06-11 – 2017-06-12 (×2): 250 mg via ORAL
  Filled 2017-06-11 (×3): qty 1

## 2017-06-11 MED ORDER — ISOSORBIDE MONONITRATE ER 30 MG PO TB24: 30.0000 mg | ORAL_TABLET | Freq: Every day | ORAL | Status: DC

## 2017-06-11 MED ORDER — STROKE: EARLY STAGES OF RECOVERY BOOK
Freq: Once | Status: AC
  Administered 2017-06-11: 05:00:00
  Filled 2017-06-11: qty 1

## 2017-06-11 MED ORDER — SODIUM CHLORIDE 0.9 % IV BOLUS (SEPSIS)
1000.0000 mL | Freq: Once | INTRAVENOUS | Status: AC
  Administered 2017-06-11: 1000 mL via INTRAVENOUS

## 2017-06-11 MED ORDER — HEPARIN SODIUM (PORCINE) 5000 UNIT/ML IJ SOLN: 5000.0000 [IU] | Freq: Three times a day (TID) | INTRAMUSCULAR | Status: DC

## 2017-06-11 NOTE — Progress Notes (Signed)
*  PRELIMINARY RESULTS* Vascular Ultrasound Carotid Duplex (Doppler) has been completed.  Preliminary findings:  Right Carotid:There is evidence in the right ICA of a 1-39% stenosis.  Left Carotid:There is evidence in the left ICA of a 40-59% stenosis. Low end 40-59% ICA stenosis at the proximal and distal portions. Vertebrals:Both vertebral arteries were patent with antegrade flow.     Everrett Coombe 06/11/2017, 2:10 PM
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
## 2017-06-11 NOTE — Consult Note (Addendum)
Neurology Consultation  Reason for Consult: Altered mental status Referring Physician: Dr. Christy Gentles  CC: Altered mental status  History is obtained from: Patient, wife  HPI: Donald [name] is a 61 y.o. male was a past medical history of coronary artery disease, CHF, stroke involving the left MCA territory with some residual right-sided weakness, a small unruptured left MCA aneurysm currently being monitored by surveillance, who was in his usual state of health by best information of wife on Thanksgiving after which she started having symptoms of confusion and gait shuffling. The patient's wife reports that he has been having memory issues now for a couple of years where he forgets simple things such as names etc.  He also keeps asking the same questions over and over again.  Up until Thanksgiving he was in his normal state of health when she started noticing that he has now started to shuffle more when she walks as well as also noted that he probably has been having some visual difficulties.  She was not able to pinpoint as to what was most bothersome or what was not right but kept saying that she feels that he is not his normal self. He was seen for his prior strokes at an outside facility.  He follows with a Dr. Melrose Nakayama in neurology at [facility] but has not seen him for a while now. When asked if he was ever diagnosed with a regular heart rate or atrial fibrillation, she said the cardiologist had told him once that he has irregular heart rate or rhythm but never started on anticoagulation.  Wife also reports that he has had memory deficits now for 2 years.  He also has had some episodes of urinary and bowel incontinence.  Patient came to the ED as an altered mental status.  He was seen and evaluated by the ED providers.  A noncontrast CT of the head was done as a part of the workup for altered mental status.  The CT scan of the head showed a subacute left PCA territory infarct.  Neurological  consultation was obtained after the imaging.  Patient currently is on aspirin and Plavix.   LKW: Few weeks ago at best tpa given?: no, outside the window Premorbid modified Rankin scale (mRS): 3  ROS: A 14 point ROS was performed and is negative except as noted in the HPI.   Past Medical History:  Diagnosis Date  . Cerebral aneurysm without rupture    Followed by Dr. Melrose Nakayama, on Plavix  . CHF (congestive heart failure) ([place])   . Clostridium difficile colitis December 2015   Presented with sepsis, required stool transplant  . Coronary artery disease   . GERD (gastroesophageal reflux disease)   . Hypothyroidism   . Stroke [facility])    NOV 2015    Family History  Family history unknown: Yes    Social History:   reports that he has quit smoking. His smoking use included cigarettes. he has never used smokeless tobacco. He reports that he does not drink alcohol or use drugs.  Medications  Current Facility-Administered Medications:  .   stroke: mapping our early stages of recovery book, , Does not apply, Once, Mikell, Jeani Sow, MD .  aspirin EC tablet 81 mg, 81 mg, Oral, Daily, Mikell, Jeani Sow, MD .  atorvastatin (LIPITOR) tablet 40 mg, 40 mg, Oral, Daily, Mikell, Asiyah Meredeth Ide, MD .  carvedilol (COREG) tablet 3.125 mg, 3.125 mg, Oral, BID WC, Mikell, Jeani Sow, MD .  clopidogrel (PLAVIX) tablet 75  mg, 75 mg, Oral, Daily, Mikell, Asiyah Meredeth Ide, MD .  enoxaparin (LOVENOX) injection 40 mg, 40 mg, Subcutaneous, Q24H, Mikell, Jeani Sow, MD .  isosorbide mononitrate (IMDUR) 24 hr tablet 30 mg, 30 mg, Oral, Daily, Mikell, Jeani Sow, MD .  lisinopril (PRINIVIL,ZESTRIL) tablet 10 mg, 10 mg, Oral, Daily, Mikell, Jeani Sow, MD .  niacin tablet 250 mg, 250 mg, Oral, Q breakfast, Mikell, Jeani Sow, MD .  nortriptyline (PAMELOR) capsule 10 mg, 10 mg, Oral, QHS, Mikell, Jeani Sow, MD .  senna-docusate (Senokot-S) tablet 1 tablet, 1 tablet, Oral, QHS PRN, Mikell, Jeani Sow, MD .  sertraline (ZOLOFT) tablet 50 mg, 50 mg, Oral, Daily, Mikell, Asiyah Meredeth Ide, MD .  sodium chloride 0.9 % bolus 1,000 mL, 1,000 mL, Intravenous, Once, Ripley Fraise, MD  Current Outpatient Medications:  .  acetaminophen (TYLENOL) 325 MG tablet, Take 325-650 mg by mouth every 6 (six) hours as needed (for headaches)., Disp: , Rfl:  .  aspirin EC 81 MG tablet, Take 81 mg by mouth daily., Disp: , Rfl:  .  atorvastatin (LIPITOR) 40 MG tablet, Take 1 tablet (40 mg total) by mouth daily., Disp: 90 tablet, Rfl: 3 .  Calcium Carbonate Antacid (TUMS PO), Take 500 mg by mouth daily as needed (heartburn). , Disp: , Rfl:  .  carvedilol (COREG) 3.125 MG tablet, Take 3.125 mg by mouth 2 (two) times daily with a meal., Disp: , Rfl:  .  clopidogrel (PLAVIX) 75 MG tablet, Take 75 mg by mouth daily., Disp: , Rfl:  .  gabapentin (NEURONTIN) 100 MG capsule, Take 100 mg by mouth 2 (two) times daily., Disp: , Rfl:  .  isosorbide mononitrate (IMDUR) 30 MG 24 hr tablet, Take 30 mg by mouth daily., Disp: , Rfl:  .  lisinopril (PRINIVIL,ZESTRIL) 10 MG tablet, Take 10 mg by mouth daily., Disp: , Rfl:  .  Multiple Vitamins-Minerals (MULTIVITAMIN ADULT PO), Take 1 tablet by mouth daily., Disp: , Rfl:  .  niacin 250 MG tablet, Take 250 mg by mouth daily with breakfast., Disp: , Rfl:  .  nitroGLYCERIN (NITROSTAT) 0.4 MG SL tablet, Place 0.4 mg under the tongue every 5 (five) minutes as needed for chest pain. May take up to 3 doses., Disp: , Rfl:  .  nortriptyline (PAMELOR) 10 MG capsule, Take 10 mg by mouth at bedtime., Disp: , Rfl:  .  sertraline (ZOLOFT) 50 MG tablet, Take 1 tablet (50 mg total) by mouth daily., Disp: 30 tablet, Rfl: 0 .  tamsulosin (FLOMAX) 0.4 MG CAPS capsule, Take 0.4 mg by mouth daily. , Disp: , Rfl:  .  vitamin B-12 (CYANOCOBALAMIN) 1000 MCG tablet, Take 1,000 mcg by mouth daily., Disp: , Rfl:   Exam: Current vital signs: BP (!) 95/53 (BP Location: Right Arm)   Pulse 73   Temp 97.8 F  (36.6 C) (Oral)   Resp 17   Ht 5\' 4"  (1.626 m)   Wt 66.7 kg (147 lb)   SpO2 99%   BMI 25.23 kg/m  Vital signs in last 24 hours: Temp:  [97.8 F (36.6 C)] 97.8 F (36.6 C) (12/12 1746) Pulse Rate:  [73-93] 73 (12/13 0009) Resp:  [16-17] 17 (12/13 0009) BP: (95-104)/(53-60) 95/53 (12/13 0009) SpO2:  [96 %-99 %] 99 % (12/13 0009) Weight:  [64.6 kg (142 lb 6.4 oz)-66.7 kg (147 lb)] 66.7 kg (147 lb) (12/12 1746)  GENERAL: Awake, alert in NAD HEENT: - Normocephalic and atraumatic, dry mm, no LN++, no Thyromegally LUNGS - Clear to  auscultation bilaterally with no wheezes CV - S1S2 RRR, no m/r/g, equal pulses bilaterally. ABDOMEN - Soft, nontender, nondistended with normoactive BS Ext: warm, well perfused, intact peripheral pulses, no edema  NEURO:  Mental Status: AA&Ox3 Language: speech is clear.  Naming, repetition, fluency, and comprehension intact. Cranial Nerves: PERRL. EOMI, visual fields examination shows right homonymous hemianopsia, symmetric smile and symmetric nasolabial folds although his face looks slightly asymmetric with drooping to the right at rest,, facial sensation intact, hearing intact, tongue/uvula/soft palate midline, normal sternocleidomastoid and trapezius muscle strength. No evidence of tongue atrophy or fibrillations Motor: 4+/5 right upper extremity, 4+/5 right lower extremity with the exception of right foot dorsiflexors that are 4/5, 5/5 left upper and lower extremity. Tone: is normal and bulk is normal Sensation- Intact to light touch bilaterally but neglects on the right on double sound and stimulation Coordination: FTN intact bilaterally, no ataxia in BLE. Gait-walks with small steps, almost magnetic-looking gait. Pull- test negative.  Some bradykinesia.  NIHSS = 3 (2 for hemianopsia, 1 for neglect)   Labs I have reviewed labs in epic and the results pertinent to this consultation are: Anemia with hemoglobin of 10.3, mild hyponatremia sodium 134,  hypokalemia potassium 3.4, creatinine 1.5 glucose 100, BUN 19  CBC    Component Value Date/Time   WBC 8.4 06/10/2017 1830   RBC 4.06 (L) 06/10/2017 1830   HGB 11.8 (L) 06/10/2017 1830   HGB 10.3 (L) 07/01/2014 0522   HCT 35.1 (L) 06/10/2017 1830   HCT 31.4 (L) 07/01/2014 0522   PLT 358 06/10/2017 1830   PLT 215 07/01/2014 0522   MCV 86.5 06/10/2017 1830   MCV 86 07/01/2014 0522   MCH 29.1 06/10/2017 1830   MCHC 33.6 06/10/2017 1830   RDW 12.6 06/10/2017 1830   RDW 14.9 (H) 07/01/2014 0522   LYMPHSABS 2.3 04/14/2015 1322   LYMPHSABS 1.1 07/01/2014 0522   MONOABS 0.6 04/14/2015 1322   MONOABS 0.7 07/01/2014 0522   EOSABS 0.2 04/14/2015 1322   EOSABS 0.2 07/01/2014 0522   BASOSABS 0.1 04/14/2015 1322   BASOSABS 0.0 07/01/2014 0522    CMP     Component Value Date/Time   NA 134 (L) 06/10/2017 1830   NA 138 06/03/2017 1514   NA 141 07/03/2014 0436   K 3.8 06/10/2017 1830   K 3.4 (L) 07/03/2014 0436   CL 101 06/10/2017 1830   CL 108 (H) 07/03/2014 0436   CO2 22 06/10/2017 1830   CO2 26 07/03/2014 0436   GLUCOSE 100 (H) 06/10/2017 1830   GLUCOSE 85 07/03/2014 0436   BUN 21 (H) 06/10/2017 1830   BUN 19 06/03/2017 1514   BUN 3 (L) 07/03/2014 0436   CREATININE 1.50 (H) 06/10/2017 1830   CREATININE 1.25 08/11/2016 1549   CALCIUM 9.1 06/10/2017 1830   CALCIUM 7.7 (L) 07/03/2014 0436   PROT 6.7 06/10/2017 1830   PROT 6.7 06/28/2014 2120   ALBUMIN 3.6 06/10/2017 1830   ALBUMIN 3.0 (L) 06/28/2014 2120   AST 23 06/10/2017 1830   AST 14 (L) 06/28/2014 2120   ALT 18 06/10/2017 1830   ALT 20 06/28/2014 2120   ALKPHOS 19 (L) 06/10/2017 1830   ALKPHOS 11 (L) 06/28/2014 2120   BILITOT 0.9 06/10/2017 1830   BILITOT 0.4 06/28/2014 2120   GFRNONAA 48 (L) 06/10/2017 1830   GFRNONAA 62 08/11/2016 1549   GFRAA 56 (L) 06/10/2017 1830   GFRAA 71 08/11/2016 1549    Lipid Panel     Component Value  Date/Time   CHOL 81 05/23/2017 1836   CHOL 101 05/25/2014 0409   TRIG 92  05/23/2017 1836   TRIG 103 05/25/2014 0409   HDL 25 (L) 05/23/2017 1836   HDL 27 (L) 05/25/2014 0409   CHOLHDL 3.2 05/23/2017 1836   VLDL 18 05/23/2017 1836   VLDL 21 05/25/2014 0409   LDLCALC 38 05/23/2017 1836   LDLCALC 53 05/25/2014 0409   Imaging I have reviewed the images obtained: CT-scan of the brain -left PCA hypodensity probably a subacute stroke.  Chronic ventriculomegaly.  MRI examination of the brain  Assessment:  61 year old man with a history of coronary artery disease, CHF, prior left MCA strokes in 2015 with residual right-sided mild weakness, presenting for evaluation of worsening confusion and gait shuffling. On examination, he has right homonymous hemianopsia as well as extinction on the right and double simultaneous stimulation. Noncontrast CT of the head shows a left PCA territory stroke which appears subacute. His gait is also abnormal, almost appears magnetic type.  With a history of memory loss, magnetic gait and bladder incontinence, there can be a component of normal pressure hydrocephalus which is contributing to his presentation.  Impression: -Subacute left PCA stroke-likely embolic in the setting of possible underlying paroxysmal atrial fibrillation. -Outpatient evaluation for normal pressure hydrocephalus (gait is short steps and shuffling and has some bradykinesia, but pull test negative and no tremor, so less likely Parkinsonian) -Coronary artery disease -CHF -HTN -HLD -Unruptured cerebral aneurysm  Recommendations: -Admit to medicine service  -Telemetry monitoring -Allow for permissive hypertension for the first 24-48h - only treat PRN if SBP >220 mmHg. Blood pressures can be gradually normalized to SBP<140 upon discharge. -MRI, MRA  of the brain without contrast (given deranged renal function, would not do CTA H+N) -Echocardiogram -HgbA1c, fasting lipid panel -Frequent neuro checks -Prophylactic therapy-Antiplatelet med: Aspirin - dose 325mg  PO  or 300mg  PR and Plavix 75. Might need anticoagulation long term if PAF is found on tele. If PAF is not found, and vessel imaging is unrevealing, might need extended cardiac monitoring (30d monitor v. Loop recorder). Stroke team to follow. -Atorvastatin 80 mg PO daily -Risk factor modification -PT consult, OT consult, Speech consult -OP Neurology for memory disorder, possible NPH, Left MCA aneurysm surveillance as well as stroke follow up.  Please page stroke NP/PA/MD (listed on AMION)  from 8am-4 pm as this patient will be followed by the stroke team at this point.  -- Amie Portland, MD [facility] [phone] If 7pm to 7am, please call on call as listed on AMION.

## 2017-06-11 NOTE — ED Notes (Signed)
Pt taken to MRI  

## 2017-06-11 NOTE — Progress Notes (Signed)
Initial Nutrition Assessment  DOCUMENTATION CODES:   Severe malnutrition in context of chronic illness  INTERVENTION:  1. Magic cup TID with meals, each supplement provides 290 kcal and 9 grams of protein 2. MVI w/ Minerals  NUTRITION DIAGNOSIS:   Severe Malnutrition related to chronic illness as evidenced by severe muscle depletion, severe fat depletion, percent weight loss.  GOAL:   Patient will meet greater than or equal to 90% of their needs  MONITOR:   PO intake, I & O's, Labs, Weight trends, Supplement acceptance  REASON FOR ASSESSMENT:   Malnutrition Screening Tool    ASSESSMENT:   61 yo M with known prior CVA, thrombosed left MCA, CAD, CHF who presents with subacute balance issues and neurologic deficits.  Seen in clinic, sent to ED yesterday for concerns of shuffling gait and mild confusion.  In ED, MRI revealed subacute stroke and question of NPH.  Admitted for subacute stroke. Also has LLQ abd pain, denies nausea/vomiting.   Spoke with patient, wife at bedside. She states patient hasn't eaten well since thanksgiving. Won't eat much of anything except ice cream or jello. When eating well, normally eats eggs, oatmeal, and french toast for breakfast, a sandwich and chips for lunch, and chicken, [place], or fish and starch and a vegetable for dinner. She also reports a 10 pound/8% severe weight loss over 1 month. Per chart, exhibits 16 pound/10.3% severe weight loss over 3 weeks. Appears to be relatively consistent. Asked patient and he simply states he has no appetite. Gives no reason or explanation. Doesn't appear to have abd pain during visit. Speech language pathology following for stroke. Encouraged patient to eat as much as he could.  Labs reviewed:  Na 134 Medications reviewed and include:  Niacin 250mg , Senokot-S  Meal Completion: 10-20%   NUTRITION - FOCUSED PHYSICAL EXAM:    Most Recent Value  Orbital Region  Severe depletion  Upper Arm Region   Severe depletion  Thoracic and Lumbar Region  Severe depletion  Buccal Region  Severe depletion  Temple Region  Severe depletion  Clavicle Bone Region  Severe depletion  Clavicle and Acromion Bone Region  Severe depletion  Scapular Bone Region  Severe depletion  Dorsal Hand  Severe depletion  Patellar Region  Moderate depletion  Anterior Thigh Region  Moderate depletion  Posterior Calf Region  Severe depletion  Edema (RD Assessment)  None  Hair  Reviewed  Eyes  Reviewed  Mouth  Reviewed  Skin  Reviewed  Nails  Reviewed       Diet Order:  Diet Heart Room service appropriate? Yes; Fluid consistency: Thin  EDUCATION NEEDS:   Education needs have been addressed  Skin:  Skin Assessment: Reviewed RN Assessment  Last BM:  06/11/2017  Height:   Ht Readings from Last 1 Encounters:  06/10/17 5\' 4"  (1.626 m)    Weight:   Wt Readings from Last 1 Encounters:  06/11/17 139 lb 12.4 oz (63.4 kg)    Ideal Body Weight:  54.54 kg  BMI:  Body mass index is 23.99 kg/m.  Estimated Nutritional Needs:   Kcal:  1580-1900 calories  Protein:  88-101 grams (1.4-1.6g/kg)  Fluid:  1.6-1.9L  Satira Anis. Noelia Lenart, MS, RD LDN Inpatient Clinical Dietitian Pager [phone]
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
## 2017-06-11 NOTE — Evaluation (Signed)
Physical Therapy Evaluation Patient Details Name: Donald [name] MRN: [id number] DOB: 1955-07-20 Today's Date: 06/11/2017   History of Present Illness  61 y.o. male presenting with memory loss, shuffling gait, and UI that has been increasingly worsening over 2 weeks. Brain MRI shows subacute left PCA territory infarct. PMH significant of CAD, CHF, prior stroke (2015), hypothyroidism, and cerebral aneurysm without rupture.   Clinical Impression  Pt presents with right visual field deficits, coordination deficits of RUE, balance deficits, cognitive deficits, and impaired mobility secondary to above. Pt tolerates all bed mobility and ambulation with min guard assist for safety. Pt's BP seated EOB = 112/50, BP standing = 98/52, BP seated in chair post-ambulation = 112/59. Pt reports no dizziness or light-headedness. Pt's wife in room. Pt with memory issues, reporting 2 stairs to enter home, however wife reminds him that they built a ramp a few years ago. Pt has 24/7 available assistance at home from spouse. Spouse drives. Educated pt and wife about being aware of visual and coordination deficits with mobility to ensure safety. Recommending Outpatient Neuro PT upon discharge in order to improve upon aforementioned deficits. PT will follow acutely to improve mobility and increase level of independence while in hospital.     Follow Up Recommendations Outpatient PT;Supervision for mobility/OOB;Other (comment)(Neuro Rehabilitation Outpatient PT)    Equipment Recommendations  None recommended by PT    Recommendations for Other Services       Precautions / Restrictions Precautions Precautions: Fall Restrictions Weight Bearing Restrictions: No      Mobility  Bed Mobility Overal bed mobility: Needs Assistance Bed Mobility: Supine to Sit     Supine to sit: Min guard;HOB elevated     General bed mobility comments: Pt able to transfer to EOB with min guard only for safety. Pt utilizes railing.    Transfers Overall transfer level: Needs assistance Equipment used: None Transfers: Sit to/from Stand Sit to Stand: Min guard;Supervision         General transfer comment: x2 (x1 min guard from EOB, x1 supervision from toilet). Pt in chair post-ambulation.   Ambulation/Gait Ambulation/Gait assistance: Min guard Ambulation Distance (Feet): 100 Feet Assistive device: None Gait Pattern/deviations: Shuffle;Decreased stride length;Wide base of support;Step-through pattern Gait velocity: decreased Gait velocity interpretation: <1.8 ft/sec, indicative of risk for recurrent falls General Gait Details: Pt walks in a straight line, however, demonstrates Parkinsonian-like gait with shuffling feet, wide BOS with feet turned out, and stiff trunk without arm swing. Pt runs into sink on right side on way to bathroom, as he is experiencing right visual field deficits.   Stairs            Wheelchair Mobility    Modified Rankin (Stroke Patients Only) Modified Rankin (Stroke Patients Only) Pre-Morbid Rankin Score: Moderate disability Modified Rankin: Moderately severe disability     Balance Overall balance assessment: Needs assistance Sitting-balance support: No upper extremity supported;Feet supported Sitting balance-Leahy Scale: Good Sitting balance - Comments: pt is able to sit EOB without UE support and shift weight dynamically to participate in MMT of LEs.    Standing balance support: No upper extremity supported;During functional activity Standing balance-Leahy Scale: Good Standing balance comment: Pt is stable standing statically without UE support and can shift weight without LOB. However, pt is limited with movement.                             Pertinent Vitals/Pain Pain Assessment: No/denies pain  Home Living Family/patient expects to be discharged to:: Private residence Living Arrangements: Spouse/significant other Available Help at Discharge: Available  24 hours/day Type of Home: Mobile home Home Access: Ramped entrance     [place]: One level        Prior Function Level of Independence: Independent               Hand Dominance        Extremity/Trunk Assessment   Upper Extremity Assessment Upper Extremity Assessment: Defer to OT evaluation;RUE deficits/detail RUE Deficits / Details: Pt with difficulty finding therapist's finger with RUE during finger to nose coordination testing.  RUE Coordination: decreased fine motor;decreased gross motor    Lower Extremity Assessment Lower Extremity Assessment: Overall WFL for tasks assessed    Cervical / Trunk Assessment Cervical / Trunk Assessment: Normal  Communication      Cognition Arousal/Alertness: Awake/alert Behavior During Therapy: Flat affect;WFL for tasks assessed/performed Overall Cognitive Status: Impaired/Different from baseline Area of Impairment: Memory;Following commands                     Memory: Decreased short-term memory Following Commands: Follows one step commands consistently       General Comments: Pt indicated that there are 2 steps to enter his mobile home, however wife reminded him that a ramp was installed a few years ago. Pt A&Ox4. Pt guesses anniversary date as Dec 29. Anniversary date is on Dec 25th. Pt able to follow commands.  Pt tested positive for right field visual deficits. Pt notes that his vision is "blurry" in his right eye.       General Comments General comments (skin integrity, edema, etc.): Pt's wife present in room during session. Pt's BP seated EOB = 112/50, BP standing = 98/52, BP seated in chair post-ambulation = 112/59. Pt reports no dizziness or light-headedness.     Exercises     Assessment/Plan    PT Assessment Patient needs continued PT services  PT Problem List Decreased mobility;Decreased coordination;Decreased cognition;Decreased balance       PT Treatment Interventions DME  instruction;Therapeutic activities;Gait training;Therapeutic exercise;Stair training;Balance training;Functional mobility training;Neuromuscular re-education;Cognitive remediation;Patient/family education    PT Goals (Current goals can be found in the Care Plan section)  Acute Rehab PT Goals Patient Stated Goal: to go home PT Goal Formulation: With patient Time For Goal Achievement: 06/25/17 Potential to Achieve Goals: Good    Frequency Min 4X/week   Barriers to discharge        Co-evaluation               AM-PAC PT "6 Clicks" Daily Activity  Outcome Measure Difficulty turning over in bed (including adjusting bedclothes, sheets and blankets)?: None Difficulty moving from lying on back to sitting on the side of the bed? : A Little Difficulty sitting down on and standing up from a chair with arms (e.g., wheelchair, bedside commode, etc,.)?: A Little Help needed moving to and from a bed to chair (including a wheelchair)?: A Little Help needed walking in hospital room?: A Little Help needed climbing 3-5 steps with a railing? : A Little 6 Click Score: 19    End of Session Equipment Utilized During Treatment: Gait belt Activity Tolerance: Patient tolerated treatment well Patient left: in chair;with call bell/phone within reach;with family/visitor present   PT Visit Diagnosis: Other abnormalities of gait and mobility (R26.89);Other symptoms and signs involving the nervous system (M22.633)    Time: 3545-6256 PT Time Calculation (min) (ACUTE ONLY): 36 min  Charges:   PT Evaluation $PT Eval Low Complexity: 1 Low PT Treatments $Gait Training: 8-22 mins   PT G Codes:        Judee Clara, SPT  Judee Clara 06/11/2017, 2:47 PM

## 2017-06-11 NOTE — Progress Notes (Signed)
NEUROHOSPITALISTS STROKE TEAM - DAILY PROGRESS NOTE   ADMISSION HISTORY: Donald [name] is a 61 y.o. male was a past medical history of coronary artery disease, CHF, stroke involving the left MCA territory with some residual right-sided weakness, a small unruptured left MCA aneurysm currently being monitored by surveillance, who was in his usual state of health by best information of wife on Thanksgiving after which she started having symptoms of confusion and gait shuffling. The patient's wife reports that he has been having memory issues now for a couple of years where he forgets simple things such as names etc.  He also keeps asking the same questions over and over again.  Up until Thanksgiving he was in his normal state of health when she started noticing that he has now started to shuffle more when she walks as well as also noted that he probably has been having some visual difficulties.  She was not able to pinpoint as to what was most bothersome or what was not right but kept saying that she feels that he is not his normal self. He was seen for his prior strokes at an outside facility.  He follows with a Dr. Melrose Nakayama in neurology at [facility] but has not seen him for a while now. When asked if he was ever diagnosed with a regular heart rate or atrial fibrillation, she said the cardiologist had told him once that he has irregular heart rate or rhythm but never started on anticoagulation.  Wife also reports that he has had memory deficits now for 2 years.  He also has had some episodes of urinary and bowel incontinence.  Patient came to the ED as an altered mental status.  He was seen and evaluated by the ED providers.  A noncontrast CT of the head was done as a part of the workup for altered mental status.  The CT scan of the head showed a subacute left PCA territory infarct.  Neurological consultation was obtained after the imaging.Patient  currently is on aspirin and Plavix.  LKW: Few weeks ago at best tpa given?: no, outside the window Premorbid modified Rankin scale (mRS): 3 NIHSS = 3 (2 for hemianopsia, 1 for neglect)  SUBJECTIVE (INTERVAL HISTORY) Wife  is at the bedside. Patient is found laying in bed in NAD. Overall he feels his condition is unchanged. Voices no new complaints. No new events reported overnight.  OBJECTIVE Lab Results: CBC:  Recent Labs  Lab 06/10/17 1830  WBC 8.4  HGB 11.8*  HCT 35.1*  MCV 86.5  PLT 358   BMP: Recent Labs  Lab 06/10/17 1830  NA 134*  K 3.8  CL 101  CO2 22  GLUCOSE 100*  BUN 21*  CREATININE 1.50*  CALCIUM 9.1   Liver Function Tests:  Recent Labs  Lab 06/10/17 1830  AST 23  ALT 18  ALKPHOS 19*  BILITOT 0.9  PROT 6.7  ALBUMIN 3.6   Cardiac Enzymes:  Recent Labs  Lab 06/11/17 1039  TROPONINI <0.03   Coagulation Studies:  Recent Labs    06/11/17 0032  APTT 31  INR 1.11   PHYSICAL EXAM Temp:  [97.6 F (36.4 C)-98.2 F (36.8 C)] 98.2 F (36.8 C) (12/13 1000) Pulse Rate:  [71-93] 73 (12/13 1000) Resp:  [11-19] 16 (12/13 1000) BP: (95-124)/(53-72) 121/56 (12/13 1000) SpO2:  [96 %-100 %] 98 % (12/13 1000) Weight:  [63.4 kg (139 lb 12.4 oz)-66.7 kg (147 lb)] 63.4 kg (139 lb 12.4 oz) (12/13 0420) General -  Well nourished, well developed, in no apparent distress Respiratory - Lungs clear bilaterally. No wheezing. Cardiovascular - Regular rate and rhythm   NEURO:  Mental Status: AA&Ox3 Language: speech is clear.  Naming, repetition, fluency, and comprehension intact.diminished recall 0/3 with poor short term memory Cranial Nerves: PERRL. EOMI, visual fields examination shows dense right homonymous hemianopsia, symmetric smile and symmetric nasolabial folds although his face looks slightly asymmetric with drooping to the right at rest,, facial sensation intact, hearing intact, tongue/uvula/soft palate midline, normal sternocleidomastoid and  trapezius muscle strength. No evidence of tongue atrophy or fibrillations Motor: 4+/5 right upper extremity, 4+/5 right lower extremity with the exception of right foot dorsiflexors that are 4/5, 5/5 left upper and lower extremity. Tone: is normal and bulk is normal Sensation- Intact to light touch bilaterally but neglects on the right on double sound and stimulation Coordination: FTN intact bilaterally, no ataxia in BLE. Gait-walks with small steps, almost magnetic-looking gait. Pull- test negative.  Some bradykinesia.  IMAGING: I have personally reviewed the radiological images below and agree with the radiology interpretations. Ct Head Wo Contrast Result Date: 06/10/2017 IMPRESSION: 1. Acute to subacute appearing nonhemorrhagic left PCA distribution infarct with chronic stable ventriculomegaly. Ventriculomegaly likely related to atrophy. 2. Chronic left insular and bilateral basal ganglial lacunar infarcts. 3. Chronic stable small vessel ischemic change of periventricular white matter.   Mr Jodene [name] Head Wo Contrast Result Date: 06/11/2017 IMPRESSION: MRI HEAD IMPRESSION: 1. Large confluent subacute left PCA territory infarct. Associated petechial hemorrhage without frank hemorrhagic transformation. No significant mass effect. 2. Diffuse ventricular prominence somewhat out of proportion to cortical sulcation, which can be seen in the setting of underlying NPH. No transependymal flow of CSF. 3. Remote left MCA infarct, with additional chronic lacunar infarcts involving the bilateral basal ganglia and thalami. MRA HEAD IMPRESSION: 1. Negative intracranial MRA for large vessel occlusion. 2. Intracranial atherosclerosis as above. No high-grade or correctable stenosis.   Echocardiogram:                                              PENDING B/L Carotid U/S:                                                PENDING     ASSESSMENT: Donald [name] is a 61 y.o. male with PMH of coronary artery  disease, CHF, prior left MCA strokes in 2015 with residual right-sided mild weakness, presenting for evaluation of worsening confusion and gait shuffling. On examination, he has right homonymous hemianopsia as well as extinction on the right and double simultaneous stimulation. Noncontrast CT of the head shows a left PCA territory stroke which appears subacute. His gait is also abnormal, almost appears magnetic type.  With a history of memory loss, magnetic gait and bladder incontinence, there can be a component of normal pressure hydrocephalus which is contributing to his presentation. MRI Imaging thus far:  Large confluent subacute left PCA territory infarct likely embolic but source unknown Remote left MCA infarct,  Chronic lacunar infarcts - bilateral basal ganglia and thalami  Suspected Etiology: likely embolic in the setting of possible underlying paroxysmal AFIB Resultant Symptoms: Right homonymous hemianopsia, Right sided weakness/neglect Stroke Risk Factors: hyperlipidemia, hypertension and CAD Other Stroke  Risk Factors: Advanced age, Hx stroke, CHF  Outstanding Stroke Work-up Studies: Echocardiogram: not done                                    PENDING B/L Carotid U/S:                                                     PENDING  06/11/2017: Neuro exam remains stable.  Echocardiogram and carotid ultrasound pending.  Patient's wife explains possible history of atrial fibrillation.  We will obtain records from the patient's outpatient cardiologist and carry Dr. [name].  Continue aspirin Plavix and statin for now.  If atrial fibrillation confirmed on this admission will need anticoagulation.  PLAN  06/11/2017: Continue Aspirin/ Plavix/ Statin - for now Frequent neuro checks Telemetry monitoring PT/OT/SLP Will have requested Cardiology records in [place] to review for AFIB Consult PM & Rehab May need TEE and Loop Recorder Placement, if AFIB cannot be confirmed on this admission Ongoing  aggressive stroke risk factor management Patient counseled to be compliant with his antithrombotic medications Follow up with [facility] in 6 weeks  HX OF STROKES: 2015 Stroke involving the left MCA territory with some residual right-sided weakness  Hx of a small unruptured left MCA aneurysm   R/O AFIB: Possible Hx of Paroxysmal A. FIB May need Outpatient TEE and Loop Recorder Placement if cannot be confirmed Cardiology Records from [place] to be obtained.  Metoprolol for Rate control as needed HOLD AC - until AFIB can be confirmed Will likely need long-term anticoagulation   Left MCA aneurysm  Outpatient Neurology surveillance   Chronic, stable Ventriculomegaly Outpatient Neurology evaluation for normal pressure hydrocephalus  gait is short steps and shuffling and has some bradykinesia, but pull test negative and no tremor, so less likely Parkinsonian  Vascular Dementia Patient was Rx Aricept by Neurologist Dr Melrose Nakayama, not on home medication list Verify if patient was taking  Hx of B12 Deficiency Continue supplementation  MEDICAL ISSUES: MANAGEMENT PER MEDICINE TEAM Anemia AKI Hyponatremia  HYPERTENSION: Stable Permissive hypertension (OK if <220/120) for 24-48 hours post stroke and then gradually normalized within 5-7 days. Long term BP goal normotensive. May slowly restart home B/P medications after 48 hours Home Meds: Coreg, Imdur, Lisinopril  HYPERLIPIDEMIA:    Component Value Date/Time   CHOL 64 06/11/2017 0448   CHOL 101 05/25/2014 0409   TRIG 46 06/11/2017 0448   TRIG 103 05/25/2014 0409   HDL 26 (L) 06/11/2017 0448   HDL 27 (L) 05/25/2014 0409   CHOLHDL 2.5 06/11/2017 0448   VLDL 9 06/11/2017 0448   VLDL 21 05/25/2014 0409   LDLCALC 29 06/11/2017 0448   LDLCALC 53 05/25/2014 0409  Home Meds:  Lipitor 40 mg LDL  goal < 70 Continued on  Lipitor to 40 mg daily Continue statin at discharge  R/O DIABETES: Lab Results  Component Value  Date   HGBA1C 5.4 06/11/2017  HgbA1c goal < 7.0  Other Active Problems: Active Problems:   Stroke [facility])   Cerebral ventriculomegaly  Hospital day # 0 VTE prophylaxis: SCD's  Diet : Diet Heart Room service appropriate? Yes; Fluid consistency: Thin   FAMILY UPDATES: Wife at bedside  TEAM UPDATES: Hospitalists Team     Prior Home  Stroke Medications:  aspirin 81 mg daily, clopidogrel 75 mg daily and Lipitor 40 mg  Discharge Stroke Meds: Please discharge patient on TBD   Disposition: 01-Home or Self Care Therapy Recs:  PENDING Home Equipment:  PENDING Follow Up:  Follow-up Information    [name],Zachary,MD Neurology. Schedule an appointment as soon as possible for a visit in 6 week(s).   Contact information: [facility]  [place], [place] [address]  [phone]  [phone] (Fax)               [name], MD -PCP Follow up in 1-2 weeks  Renie Ora Stroke Neurology Team 06/11/2017 1:42 PM I have personally examined this patient, reviewed notes, independently viewed imaging studies, participated in medical decision making and plan of care.ROS completed by me personally and pertinent positives fully documented  I have made any additions or clarifications directly to the above note. Agree with note above. He presented with subacute confusion, memory loss and right-sided vision deficit secondary to left posterior cerebral artery embolic infarct source to be identified. She has history of mild Baseline vascular dementia from prior strokes. Recommend continue ongoing stroke evaluation. He has high possibility of paroxysmal atrial fibrillation and may need prolonged cardiac monitoring to prove this. Continue aspirin and Plavix for now given his cardiac history. Long discussion with wife at the bedside and answered questions. Greater than 50% time during this 35 minute visit was spent on counseling and coordination of  care about his stroke and discussion of plan per evaluation and treatment and answering questions  Antony Contras, MD Medical Director [place] Pager: [phone] 06/11/2017 3:14 PM  To contact Stroke Continuity provider, please refer to [email or web address] After hours, contact General Neurology

## 2017-06-11 NOTE — Progress Notes (Signed)
  Echocardiogram 2D Echocardiogram has been performed.  Donald [name] Donald [name] Donald [name] 06/11/2017, 10:25 AM

## 2017-06-11 NOTE — Progress Notes (Signed)
Family Medicine Teaching Service Daily Progress Note Intern Pager: [phone]  Patient name: Donald [name] Medical record number: [id number] Date of birth: [birth year] Age: 61 y.o. Gender: male  Primary Care Provider: [name], MD Consultants: Neuro Code Status: Full  Pt Overview and Major Events to Date:  Donald [name] is a 61 y.o. male presenting with neurologic defiencies, sent from clinic. PMH is significant forCAD, CHF, GERD, h/o stroke 2015, hypothyroidism, thrombosed left MCA  Assessment and Plan:  Subacute stroke of left PCA: Subtle neurological deficiencies noted per wife for several weeks.  Patient with history of stroke no specific residual symptoms from this 2015 stroke.  Now with new symptoms of shuffling gait, loss of vision in the lateral part of the right eye per patient which he had not noticed, right-sided tongue deviation.  CT head  notable for acute to subacute appearing nonhemorrhagic left PCA distribution infarct, chronic infarcts of left insular bilateral basal ganglia lacunar infarcts. Confirmed on MRI, with remote left MCA infarct and multilple chronic lacunar infarts bilateral basal ganglia and thalami. Carotid doppler show less than 50% stenosis bilaterally. Echo revealed 40-45% LVEF with diastolic grade 1 dysfunction. -Neurology consult; appreciate recs: shuffling gait and bradykinesia likely Parkinsonian, restart BP meds after 48 hours - Consulting CIR for possible inpatient rehab per speech and neuro recs -reg diet -Holding hypertensive agents for now; slowly work to goal of normotensive over next 5-7 days; normotensive today  Left lower quadrant abdominal pain: Acute. Resolved. Patient has had this abdominal pain since his last admission on May 23, 2017.  He denies it worsening.  He denies any nausea or vomiting.  He denies any fevers or chills.  No concern for acute abdomen given history.   - Cont to monitor for signs and symptoms. Patient  states this issue has resolved as of yesterday morning. C. Diff PCR negative. Consider GI cocktail if issue resumes.  Prior h/o stroke, 2015: Denies any global deficits from this prior stroke in 2015.  Currently on Plavix for left MCA thrombus. - continue ASA 325mg  - Continue plavix  - Possible history of Afib, Neuro is working on obtaining records, may need TEE outpatient - Appreciate Neuro recs, see left PCA stroke above for details  CAD: H/o CAD,CHFfollows with Dr. [name] at [facility] with last visit 02/2016. Cardiac cath in 08/2007 with 40% left main disease, 100% mid LAD, 90% ostial LCX dz with small distal vesse -Holding Imdur for now -Continue Coreg  HFrEF: 04/2017 ECHO: EF of 40-45%, G1DD, Septal , apical and inferior basal hypokinesis. New echo on 05/2017 shwoed no change. -Home Coreg continued -Holding lisinopril and IMUDR for now for possible permissive hypertension, restart on 12/15 if hypertensive   FEN/GI: Thin liquids PPx: Lovenox  Disposition: Home vs Rehab  Subjective:  Patient this morning states he feels good and wants to go home. He is oriented to person, place, and time. He does have some trouble finding words but is on a regular diet and reports he can walk without feeling dizzy or unstable and has good strength. He denies chest pain, headache, SOB, abdominal pain, n/v, and is having normal bowel movements.  Objective: Temp:  [97.7 F (36.5 C)-98.4 F (36.9 C)] 97.7 F (36.5 C) (12/14 0400) Pulse Rate:  [73-84] 75 (12/14 0400) Resp:  [14-18] 18 (12/14 0400) BP: (95-127)/(46-72) 127/59 (12/14 0400) SpO2:  [98 %-100 %] 100 % (12/14 0400) Physical Exam: General: A&O, NAD Cardiovascular: RRR, normal S1, S2, no murmurs Respiratory: CTAB, no  wheezes, no crackles Abdomen: soft, non-distended, non-tender, +bs Neuro: UE strength 5/5, LE strength 5/5 Extremities: no clubbing, no cyanosis, no edema  Laboratory: Recent Labs  Lab 06/10/17 1830 06/12/17 0726   WBC 8.4 6.5  HGB 11.8* 11.9*  HCT 35.1* 35.7*  PLT 358 293   Recent Labs  Lab 06/10/17 1830 06/12/17 0726  NA 134* 136  K 3.8 4.1  CL 101 104  CO2 22 22  BUN 21* 17  CREATININE 1.50* 1.31*  CALCIUM 9.1 8.9  PROT 6.7  --   BILITOT 0.9  --   ALKPHOS 19*  --   ALT 18  --   AST 23  --   GLUCOSE 100* 77   12/13 - Troponin I: < 0.03 12/13 - Lipid panel: wnl, exceptio of HDL low at 26 12/13 - HgbaA1c: 5.4  Imaging/Diagnostic Tests:  12/12 - CT Head w/o Contrast: IMPRESSION: 1. Acute to subacute appearing nonhemorrhagic left PCA distribution infarct with chronic stable ventriculomegaly. Ventriculomegaly likely related to atrophy. 2. Chronic left insular and bilateral basal ganglial lacunar infarcts. 3. Chronic stable small vessel ischemic change of periventricular white matter.  12/13 - MRI and MRA of Brain and Head: IMPRESSION: MRI HEAD IMPRESSION:  1. Large confluent subacute left PCA territory infarct. Associated petechial hemorrhage without frank hemorrhagic transformation. No significant mass effect. 2. Diffuse ventricular prominence somewhat out of proportion to cortical sulcation, which can be seen in the setting of underlying NPH. No transependymal flow of CSF. 3. Remote left MCA infarct, with additional chronic lacunar infarcts involving the bilateral basal ganglia and thalami.  MRA HEAD IMPRESSION:  1. Negative intracranial MRA for large vessel occlusion. 2. Intracranial atherosclerosis as above. No high-grade or correctable stenosis.  12/13 - Carotid Doppler: Right ICA 40-49%, Left 40-59%  12/13 - Echo: LVEF: 40-45% with akinesis of apical LV. No vavle dysfunction. Grade 1 diastolic dysfunction.  Nuala Alpha, DO 06/12/2017, 7:01 AM PGY-1, [place] Intern pager: [phone], text pages welcome

## 2017-06-12 ENCOUNTER — Telehealth: Payer: Self-pay | Admitting: Psychology

## 2017-06-12 LAB — CBC
HCT: 35.7 % — ABNORMAL LOW (ref 39.0–52.0)
HEMOGLOBIN: 11.9 g/dL — AB (ref 13.0–17.0)
MCH: 29 pg (ref 26.0–34.0)
MCHC: 33.3 g/dL (ref 30.0–36.0)
MCV: 86.9 fL (ref 78.0–100.0)
Platelets: 293 10*3/uL (ref 150–400)
RBC: 4.11 MIL/uL — ABNORMAL LOW (ref 4.22–5.81)
RDW: 12.8 % (ref 11.5–15.5)
WBC: 6.5 10*3/uL (ref 4.0–10.5)

## 2017-06-12 LAB — BASIC METABOLIC PANEL
Anion gap: 10 (ref 5–15)
BUN: 17 mg/dL (ref 6–20)
CHLORIDE: 104 mmol/L (ref 101–111)
CO2: 22 mmol/L (ref 22–32)
CREATININE: 1.31 mg/dL — AB (ref 0.61–1.24)
Calcium: 8.9 mg/dL (ref 8.9–10.3)
GFR calc Af Amer: >60 mL/min (ref >60)
GFR calc non Af Amer: 57 mL/min — ABNORMAL LOW (ref >60)
GLUCOSE: 77 mg/dL (ref 65–99)
Potassium: 4.1 mmol/L (ref 3.5–5.1)
SODIUM: 136 mmol/L (ref 135–145)

## 2017-06-12 MED ORDER — ASPIRIN 325 MG PO TBEC: 325.0000 mg | DELAYED_RELEASE_TABLET | Freq: Every day | ORAL | 0 refills | Status: DC

## 2017-06-12 MED ORDER — ENOXAPARIN SODIUM 40 MG/0.4ML ~~LOC~~ SOLN: 40.0000 mg | SUBCUTANEOUS | Status: DC

## 2017-06-12 MED ORDER — SENNOSIDES-DOCUSATE SODIUM 8.6-50 MG PO TABS: 1.0000 | ORAL_TABLET | Freq: Every evening | ORAL | 0 refills | Status: DC | PRN

## 2017-06-12 NOTE — Progress Notes (Signed)
Physical Therapy Treatment Patient Details Name: Donald [name] MRN: [id number] DOB: 11-18-1955 Today's Date: 06/12/2017    History of Present Illness 61 y.o. male presenting with memory loss, shuffling gait, and UI that has been increasingly worsening over 2 weeks. Brain MRI shows subacute left PCA territory infarct. PMH significant of CAD, CHF, prior stroke (2015), hypothyroidism, and cerebral aneurysm without rupture.     PT Comments    Pt putting on shoes and getting ready to go home on entry. Pt agreeable to taking a walk before going home. Pt is limited in his mobility by vision and perception deficits and R LE weakness with fatigue. Pt is currently supervision for transfers and ambulation of 300 feet without AD. Pt and wife educated on need to be aware of R LE fatigue and decreased foot clearance so as not to trip as well as need to walk with someone due to visual deficits. D/c plan remains appropriate.   Follow Up Recommendations  Outpatient PT;Supervision for mobility/OOB;Other (comment)(Neuro Outpatient rehab)     Equipment Recommendations  None recommended by PT    Recommendations for Other Services       Precautions / Restrictions Precautions Precautions: Fall Restrictions Weight Bearing Restrictions: No    Mobility  Bed Mobility               General bed mobility comments: pt sitting on EoB at entry   Transfers Overall transfer level: Needs assistance Equipment used: None Transfers: Sit to/from Stand Sit to Stand: Supervision         General transfer comment: Supervision for safety, good power up and steadying  Ambulation/Gait Ambulation/Gait assistance: Supervision Ambulation Distance (Feet): 300 Feet Assistive device: None Gait Pattern/deviations: Step-through pattern;Decreased stride length;Decreased dorsiflexion - right Gait velocity: slowed Gait velocity interpretation: Below normal speed for age/gender General Gait Details: supervision  for safety, pt continues to have Parkinsonian-like festination to his gait, lacking arms swing and trunk rotation, as gait progressed R LE became weaker and his dorsiflexion decreased. vc for increased knee flexion to clear R foot       Balance Overall balance assessment: Needs assistance Sitting-balance support: No upper extremity supported;Feet supported Sitting balance-Leahy Scale: Good Sitting balance - Comments: pt is able to sit EOB without UE support and shift weight dynamically to participate in MMT of LEs.    Standing balance support: No upper extremity supported;During functional activity Standing balance-Leahy Scale: Good Standing balance comment: Pt is stable standing statically without UE support and can shift weight without LOB. However, pt is limited with movement.                            Cognition Arousal/Alertness: Awake/alert Behavior During Therapy: Flat affect;WFL for tasks assessed/performed Overall Cognitive Status: Impaired/Different from baseline Area of Impairment: Attention;Memory;Following commands;Problem solving                   Current Attention Level: Sustained Memory: Decreased short-term memory Following Commands: Follows one step commands consistently     Problem Solving: Slow processing;Requires verbal cues        Exercises      General Comments General comments (skin integrity, edema, etc.): pt requires supervision with gait for direction due to decreased vision and perception. Pt and wife educated to that fact      Pertinent Vitals/Pain Pain Assessment: No/denies pain           PT Goals (current goals can now  be found in the care plan section) Acute Rehab PT Goals Patient Stated Goal: to go home PT Goal Formulation: With patient Time For Goal Achievement: 06/25/17 Potential to Achieve Goals: Good Progress towards PT goals: Progressing toward goals    Frequency    Min 4X/week      PT Plan Current  plan remains appropriate    Co-evaluation              AM-PAC PT "6 Clicks" Daily Activity  Outcome Measure  Difficulty turning over in bed (including adjusting bedclothes, sheets and blankets)?: None Difficulty moving from lying on back to sitting on the side of the bed? : None Difficulty sitting down on and standing up from a chair with arms (e.g., wheelchair, bedside commode, etc,.)?: A Little Help needed moving to and from a bed to chair (including a wheelchair)?: A Little Help needed walking in hospital room?: A Little Help needed climbing 3-5 steps with a railing? : A Little 6 Click Score: 20    End of Session Equipment Utilized During Treatment: Gait belt Activity Tolerance: Patient tolerated treatment well Patient left: with call bell/phone within reach;with family/visitor present(seated EoB) Nurse Communication: Mobility status PT Visit Diagnosis: Other abnormalities of gait and mobility (R26.89);Other symptoms and signs involving the nervous system (J88.325)     Time: 4982-6415 PT Time Calculation (min) (ACUTE ONLY): 11 min  Charges:  $Gait Training: 8-22 mins                    G Codes:       Hawthorne Day B. Migdalia Dk PT, DPT Acute Rehabilitation  [phone] Pager [phone]     [place] 06/12/2017, 4:27 PM
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
## 2017-06-12 NOTE — Care Management Note (Signed)
Case Management Note  Patient Details  Name: Donald [name] MRN: [id number] Date of Birth: [birth year]  Subjective/Objective:     Pt admitted with CVA. He is from home with his spouse.               Action/Plan: CM consulted for outpatient therapy. CM met with the patient and his wife and they would like to attend [facility]. Orders in Epic and information on the AVS.  Wife to provide transportation home.  Expected Discharge Date:  06/12/17               Expected Discharge Plan:  OP Rehab  In-House Referral:     Discharge planning Services  CM Consult  Post Acute Care Choice:    Choice offered to:     DME Arranged:    DME Agency:     HH Arranged:    HH Agency:     Status of Service:  Completed, signed off  If discussed at [employer] of Stay Meetings, dates discussed:    Additional Comments:  Pollie Friar, RN 06/12/2017, 3:22 PM

## 2017-06-12 NOTE — Discharge Instructions (Signed)
°  You will be called to schedule an outpatient trans-esophageal echocardiogram and loop recorder implantation.  Please do not drive after this stroke and follow up with the stroke team as scheduled.

## 2017-06-12 NOTE — Progress Notes (Signed)
Pt alert and oriented, eager to return home. No new concerns. Vital signs stable. Telemetry and IV removed. Pt and family educated on instructions to transfer home and follow up appointments. Pt and family verbalized understanding of discharge instructions and teaching, and were able to teach back.

## 2017-06-12 NOTE — Telephone Encounter (Signed)
Called patient to reschedule appointment he has with me as I will not be in office on 12/28. Patient had trouble understanding me and I spoke to his wife instead. Per wife, patient has had a stroke and is in hospital currently and they need to cancel 12/28 appointment and will call back once they are ready to reschedule. Wife confirmed she has [facility] telephone number.

## 2017-06-12 NOTE — Progress Notes (Signed)
Pt not in need of an inpt rehab admission. Out pt therapy is recommended. I will alert RN CM. [phone]

## 2017-06-12 NOTE — Progress Notes (Signed)
Patient had quiet restful night with no issues to report.

## 2017-06-12 NOTE — Evaluation (Signed)
Occupational Therapy Evaluation Patient Details Name: Donald [name] MRN: [id number] DOB: 16-Apr-1956 Today's Date: 06/12/2017    History of Present Illness 61 y.o. male presenting with memory loss, shuffling gait, and UI that has been increasingly worsening over 2 weeks. Brain MRI shows subacute left PCA territory infarct. PMH significant of CAD, CHF, prior stroke (2015), hypothyroidism, and cerebral aneurysm without rupture.    Clinical Impression   Pt admitted with the above diagnosis and has the deficits listed below. Pt would benefit from cont OT at OP level of 24 hour S available to focus on decreased attn span, vision and perception, cognition and fine motor skills.  Pt will need 24 hour S at all times for first few weeks.  Spoke with wife at length about concerns for home in regard to pt's deficits and she was very responsive.  Feel Neuro Outpt would serve this pt well.      Follow Up Recommendations  Outpatient OT;Supervision/Assistance - 24 hour    Equipment Recommendations       Recommendations for Other Services       Precautions / Restrictions Precautions Precautions: Fall Restrictions Weight Bearing Restrictions: No      Mobility Bed Mobility Overal bed mobility: Needs Assistance Bed Mobility: Supine to Sit     Supine to sit: Min guard;HOB elevated     General bed mobility comments: Pt able to transfer to EOB with min guard only for safety. Pt utilizes railing.   Transfers Overall transfer level: Needs assistance Equipment used: None Transfers: Sit to/from Stand Sit to Stand: Min guard;Supervision         General transfer comment: S for both attempts    Balance Overall balance assessment: Needs assistance Sitting-balance support: No upper extremity supported;Feet supported Sitting balance-Leahy Scale: Good Sitting balance - Comments: pt is able to sit EOB without UE support and shift weight dynamically to participate in MMT of LEs.    Standing  balance support: No upper extremity supported;During functional activity Standing balance-Leahy Scale: Good Standing balance comment: Pt is stable standing statically without UE support and can shift weight without LOB. However, pt is limited with movement.                           ADL either performed or assessed with clinical judgement   ADL Overall ADL's : Needs assistance/impaired Eating/Feeding: Set up;Sitting   Grooming: Wash/dry hands;Wash/dry face;Oral care;Set up;Sitting Grooming Details (indicate cue type and reason): cues needed as we did this task EOB which is not normal for him.  Difficulties noticed in problem solving through this. Upper Body Bathing: Set up;Sitting   Lower Body Bathing: Minimal assistance;Sit to/from stand;Cueing for safety;Cueing for sequencing   Upper Body Dressing : Set up;Sitting Upper Body Dressing Details (indicate cue type and reason): assist with buttons Lower Body Dressing: Minimal assistance;Sit to/from stand;Cueing for compensatory techniques   Toilet Transfer: Minimal assistance;Ambulation;Comfort height toilet Toilet Transfer Details (indicate cue type and reason): pt walked to bathroom with cues to avoid items on his R side. Toileting- Water quality scientist and Hygiene: Min guard;Sit to/from stand       Functional mobility during ADLs: Minimal assistance;Cueing for safety General ADL Comments: Pt did well with adls if supervision available when he is on his feet. Pt does have difficulty locating items in his R visual field but quickly finds them when cued.  Pt unable to sustain a visual gaze R or L for longer than a  second.     Vision Baseline Vision/History: Wears glasses Wears Glasses: At all times Patient Visual Report: Peripheral vision impairment Vision Assessment?: Vision impaired- to be further tested in functional context;Yes Eye Alignment: Within Functional Limits Ocular Range of Motion: Impaired-to be further  tested in functional context;Other (comment)(unable to sustain eye movement outside of midline for long) Alignment/Gaze Preference: Gaze left Tracking/Visual Pursuits: Decreased smoothness of horizontal tracking;Unable to hold eye position out of midline Saccades: Undershoots Convergence: Impaired - to be further tested in functional context Depth Perception: Undershoots Additional Comments: difficult to assess.  Will definitely need visual follow up     Perception Perception Perception Tested?: Yes Perception Deficits: Inattention/neglect Inattention/Neglect: Does not attend to right visual field   Praxis Praxis Praxis tested?: Within functional limits    Pertinent Vitals/Pain Pain Assessment: No/denies pain     Hand Dominance Right   Extremity/Trunk Assessment Upper Extremity Assessment Upper Extremity Assessment: RUE deficits/detail RUE Coordination: decreased fine motor;decreased gross motor       Cervical / Trunk Assessment Cervical / Trunk Assessment: Normal   Communication Communication Communication: Expressive difficulties   Cognition Arousal/Alertness: Awake/alert Behavior During Therapy: Flat affect;WFL for tasks assessed/performed Overall Cognitive Status: Impaired/Different from baseline Area of Impairment: Attention;Memory;Following commands;Problem solving                   Current Attention Level: Sustained Memory: Decreased short-term memory Following Commands: Follows one step commands consistently     Problem Solving: Slow processing;Requires verbal cues General Comments: Pt indicated that there are 2 steps to enter his mobile home, however wife reminded him that a ramp was installed a few years ago. Pt A&Ox4. Pt guesses anniversary date as Dec 29. Anniversary date is on Dec 25th. Pt able to follow commands.  Pt tested positive for right field visual deficits. Pt notes that his vision is "blurry" in his right eye.    General Comments  Once pt  becomes mobile, that is when pt is unsafe on his feet.  Pt stands safely but vision and perception limit him once mobile.    Exercises     Shoulder Instructions      Home Living Family/patient expects to be discharged to:: Private residence Living Arrangements: Spouse/significant other Available Help at Discharge: Available 24 hours/day Type of Home: Mobile home Home Access: Ramped entrance     Home Layout: One level     Bathroom Shower/Tub: Occupational psychologist: Standard     Home Equipment: Bedside commode   Additional Comments: will use BSC in shower      Prior Functioning/Environment Level of Independence: Independent                 OT Problem List: Impaired balance (sitting and/or standing);Impaired vision/perception;Decreased coordination;Decreased cognition;Decreased safety awareness;Decreased knowledge of use of DME or AE;Impaired UE functional use      OT Treatment/Interventions: Self-care/ADL training;Therapeutic activities;Cognitive remediation/compensation;Visual/perceptual remediation/compensation    OT Goals(Current goals can be found in the care plan section) Acute Rehab OT Goals Patient Stated Goal: to go home OT Goal Formulation: With patient/family Time For Goal Achievement: 06/26/17 Potential to Achieve Goals: Good ADL Goals Additional ADL Goal #1: Pt will safely walk from bed to bathroom to complete all toileting tasks with Supervision. Additional ADL Goal #2: Pt will locate and name all food on his tray and on his bedside table Ily. Additional ADL Goal #3: Pt will walk to shower and transfer into shower using shower chair with minimal  vcs only. Additional ADL Goal #4: Pt will name 2 things he struggles with in area of adls to build insight of deficits and build safety awareness without assist.  OT Frequency: Min 2X/week   Barriers to D/C:    wife home at all times       Co-evaluation              AM-PAC PT "6 Clicks"  Daily Activity     Outcome Measure Help from another person eating meals?: A Little Help from another person taking care of personal grooming?: A Little Help from another person toileting, which includes using toliet, bedpan, or urinal?: A Little Help from another person bathing (including washing, rinsing, drying)?: A Little Help from another person to put on and taking off regular upper body clothing?: A Little Help from another person to put on and taking off regular lower body clothing?: A Little 6 Click Score: 18   End of Session Nurse Communication: Mobility status  Activity Tolerance: Patient tolerated treatment well Patient left: in bed;with call bell/phone within reach;with family/visitor present  OT Visit Diagnosis: Unsteadiness on feet (R26.81);Other abnormalities of gait and mobility (R26.89);Low vision, both eyes (H54.2);Other symptoms and signs involving the nervous system (R29.898);Other symptoms and signs involving cognitive function;Cognitive communication deficit (R41.841) Symptoms and signs involving cognitive functions: Cerebral infarction                Time: 1133-1202 OT Time Calculation (min): 29 min Charges:  OT General Charges $OT Visit: 1 Visit OT Evaluation $OT Eval Moderate Complexity: 1 Mod OT Treatments $Self Care/Home Management : 8-22 mins G-Codes:     Jinger Neighbors, OTR/L  Glenford Peers 06/12/2017, 12:34 PM

## 2017-06-12 NOTE — Progress Notes (Signed)
NEUROHOSPITALISTS STROKE TEAM - DAILY PROGRESS NOTE   ADMISSION HISTORY: Donald [name] is a 60 y.o. male was a past medical history of coronary artery disease, CHF, stroke involving the left MCA territory with some residual right-sided weakness, a small unruptured left MCA aneurysm currently being monitored by surveillance, who was in his usual state of health by best information of wife on Thanksgiving after which she started having symptoms of confusion and gait shuffling. The patient's wife reports that he has been having memory issues now for a couple of years where he forgets simple things such as names etc.  He also keeps asking the same questions over and over again.  Up until Thanksgiving he was in his normal state of health when she started noticing that he has now started to shuffle more when she walks as well as also noted that he probably has been having some visual difficulties.  She was not able to pinpoint as to what was most bothersome or what was not right but kept saying that she feels that he is not his normal self. He was seen for his prior strokes at an outside facility.  He follows with a Dr. Melrose [name] in neurology at [facility] but has not seen him for a while now. When asked if he was ever diagnosed with a regular heart rate or atrial fibrillation, she said the cardiologist had told him once that he has irregular heart rate or rhythm but never started on anticoagulation.  Wife also reports that he has had memory deficits now for 2 years.  He also has had some episodes of urinary and bowel incontinence.  Patient came to the ED as an altered mental status.  He was seen and evaluated by the ED providers.  A noncontrast CT of the head was done as a part of the workup for altered mental status.  The CT scan of the head showed a subacute left PCA territory infarct.  Neurological consultation was obtained after the imaging.Patient  currently is on aspirin and Plavix.  LKW: Few weeks ago at best tpa given?: no, outside the window Premorbid modified Rankin scale (mRS): 3 NIHSS = 3 (2 for hemianopsia, 1 for neglect)  SUBJECTIVE (INTERVAL HISTORY) Wife  is at the bedside. Patient is found laying in bed in NAD. Overall he feels his condition is unchanged. Voices no new complaints. No new events reported overnight. He wants to go home. 2-D echo showed ejection fraction 4045% with apical hypokinesis. No definite clot was noted. Patient does not want to stay to weekend for getting a TEE done  OBJECTIVE Lab Results: CBC:  Recent Labs  Lab 06/10/17 1830 06/12/17 0726  WBC 8.4 6.5  HGB 11.8* 11.9*  HCT 35.1* 35.7*  MCV 86.5 86.9  PLT 358 293   BMP: Recent Labs  Lab 06/10/17 1830 06/12/17 0726  NA 134* 136  K 3.8 4.1  CL 101 104  CO2 22 22  GLUCOSE 100* 77  BUN 21* 17  CREATININE 1.50* 1.31*  CALCIUM 9.1 8.9   Liver Function Tests:  Recent Labs  Lab 06/10/17 1830  AST 23  ALT 18  ALKPHOS 19*  BILITOT 0.9  PROT 6.7  ALBUMIN 3.6   Cardiac Enzymes:  Recent Labs  Lab 06/11/17 1039  TROPONINI <0.03   Coagulation Studies:  Recent Labs    06/11/17 0032  APTT 31  INR 1.11   PHYSICAL EXAM Temp:  [97.7 F (36.5 C)-98.5 F (36.9 C)] 98.5 F (36.9 C) (  12/14 1002) Pulse Rate:  [74-84] 80 (12/14 1002) Resp:  [14-18] 16 (12/14 1002) BP: (112-129)/(47-72) 122/60 (12/14 1002) SpO2:  [98 %-100 %] 99 % (12/14 1002) General - Well nourished, well developed, in no apparent distress Respiratory - Lungs clear bilaterally. No wheezing. Cardiovascular - Regular rate and rhythm   NEURO:  Mental Status: AA&Ox3 Language: speech is clear.  Naming, repetition, fluency, and comprehension intact.diminished recall 0/3 with poor short term memory Cranial Nerves: PERRL. EOMI, visual fields examination shows dense right homonymous hemianopsia, symmetric smile and symmetric nasolabial folds although his face  looks slightly asymmetric with drooping to the right at rest,, facial sensation intact, hearing intact, tongue/uvula/soft palate midline, normal sternocleidomastoid and trapezius muscle strength. No evidence of tongue atrophy or fibrillations Motor: 4+/5 right upper extremity, 4+/5 right lower extremity with the exception of right foot dorsiflexors that are 4/5, 5/5 left upper and lower extremity. Tone: is normal and bulk is normal Sensation- Intact to light touch bilaterally but neglects on the right on double sound and stimulation Coordination: FTN intact bilaterally, no ataxia in BLE. Gait-walks with small steps, almost magnetic-looking gait. Pull- test negative.  Some bradykinesia.  IMAGING: I have personally reviewed the radiological images below and agree with the radiology interpretations. Ct Head Wo Contrast Result Date: 06/10/2017 IMPRESSION: 1. Acute to subacute appearing nonhemorrhagic left PCA distribution infarct with chronic stable ventriculomegaly. Ventriculomegaly likely related to atrophy. 2. Chronic left insular and bilateral basal ganglial lacunar infarcts. 3. Chronic stable small vessel ischemic change of periventricular white matter.   [name] Head Wo Contrast Result Date: 06/11/2017 IMPRESSION: MRI HEAD IMPRESSION: 1. Large confluent subacute left PCA territory infarct. Associated petechial hemorrhage without frank hemorrhagic transformation. No significant mass effect. 2. Diffuse ventricular prominence somewhat out of proportion to cortical sulcation, which can be seen in the setting of underlying NPH. No transependymal flow of CSF. 3. Remote left MCA infarct, with additional chronic lacunar infarcts involving the bilateral basal ganglia and thalami. MRA HEAD IMPRESSION: 1. Negative intracranial MRA for large vessel occlusion. 2. Intracranial atherosclerosis as above. No high-grade or correctable stenosis.   Echocardiogram:                                               PENDING B/L Carotid U/S:                                                PENDING     ASSESSMENT: Donald [name] is a 61 y.o. male with PMH of coronary artery disease, CHF, prior left MCA strokes in 2015 with residual right-sided mild weakness, presenting for evaluation of worsening confusion and gait shuffling. On examination, he has right homonymous hemianopsia as well as extinction on the right and double simultaneous stimulation. Noncontrast CT of the head shows a left PCA territory stroke which appears subacute. His gait is also abnormal, almost appears magnetic type.  With a history of memory loss, magnetic gait and bladder incontinence, there can be a component of normal pressure hydrocephalus which is contributing to his presentation. MRI Imaging thus far:  Large confluent subacute left PCA territory infarct likely embolic but source unknown Remote left MCA infarct,  Chronic lacunar infarcts - bilateral basal ganglia and  thalami  Suspected Etiology: likely embolic in the setting of possible underlying paroxysmal AFIB Resultant Symptoms: Right homonymous hemianopsia, Right sided weakness/neglect Stroke Risk Factors: hyperlipidemia, hypertension and CAD Other Stroke Risk Factors: Advanced age, Hx stroke, CHF  Outstanding Stroke Work-up Studies: Echocardiogram: not done                                    PENDING B/L Carotid U/S:                                                     PENDING  06/12/2017: Neuro exam remains stable.  Echocardiogram and carotid ultrasound pending.  Patient's wife explains possible history of atrial fibrillation.  We will obtain records from the patient's outpatient cardiologist and carry Dr. [name].  Continue aspirin Plavix and statin for now.  If atrial fibrillation confirmed on this admission will need anticoagulation.  PLAN  06/12/2017: Continue Aspirin/ Plavix/ Statin - for now Frequent neuro checks Telemetry monitoring PT/OT/SLP Will have requested  Cardiology records in [place] to review for AFIB Consult PM & Rehab May need TEE and Loop Recorder Placement, if AFIB cannot be confirmed on this admission Ongoing aggressive stroke risk factor management Patient counseled to be compliant with his antithrombotic medications Follow up with [facility] in 6 weeks  HX OF STROKES: 2015 Stroke involving the left MCA territory with some residual right-sided weakness  Hx of a small unruptured left MCA aneurysm   R/O AFIB: Possible Hx of Paroxysmal A. FIB May need Outpatient TEE and Loop Recorder Placement if cannot be confirmed Cardiology Records from [place] to be obtained.  Metoprolol for Rate control as needed HOLD AC - until AFIB can be confirmed Will likely need long-term anticoagulation   Left MCA aneurysm  Outpatient Neurology surveillance   Chronic, stable Ventriculomegaly Outpatient Neurology evaluation for normal pressure hydrocephalus  gait is short steps and shuffling and has some bradykinesia, but pull test negative and no tremor, so less likely Parkinsonian  Vascular Dementia Patient was Rx Aricept by Neurologist Dr Donald [name], not on home medication list Verify if patient was taking  Hx of B12 Deficiency Continue supplementation  MEDICAL ISSUES: MANAGEMENT PER MEDICINE TEAM Anemia AKI Hyponatremia  HYPERTENSION: Stable Permissive hypertension (OK if <220/120) for 24-48 hours post stroke and then gradually normalized within 5-7 days. Long term BP goal normotensive. May slowly restart home B/P medications after 48 hours Home Meds: Coreg, Imdur, Lisinopril  HYPERLIPIDEMIA:    Component Value Date/Time   CHOL 64 06/11/2017 0448   CHOL 101 05/25/2014 0409   TRIG 46 06/11/2017 0448   TRIG 103 05/25/2014 0409   HDL 26 (L) 06/11/2017 0448   HDL 27 (L) 05/25/2014 0409   CHOLHDL 2.5 06/11/2017 0448   VLDL 9 06/11/2017 0448   VLDL 21 05/25/2014 0409   LDLCALC 29 06/11/2017 0448   LDLCALC 53 05/25/2014 0409   Home Meds:  Lipitor 40 mg LDL  goal < 70 Continued on  Lipitor to 40 mg daily Continue statin at discharge  R/O DIABETES: Lab Results  Component Value Date   HGBA1C 5.4 06/11/2017  HgbA1c goal < 7.0  Other Active Problems: Active Problems:   Stroke [facility])   Cerebral ventriculomegaly  Hospital day # 1 VTE prophylaxis:  SCD's  Diet : Diet Heart Room service appropriate? Yes; Fluid consistency: Thin   FAMILY UPDATES: Wife at bedside  TEAM UPDATES: Hospitalists Team     Prior Home Stroke Medications:  aspirin 81 mg daily, clopidogrel 75 mg daily and Lipitor 40 mg  Discharge Stroke Meds: Please discharge patient on TBD   Disposition: 01-Home or Self Care Therapy Recs:  PENDING Home Equipment:  PENDING Follow Up:  Follow-up Information    [name],Zachary,MD Neurology. Schedule an appointment as soon as possible for a visit in 6 week(s).   Contact information: [facility]  [place], [place] [address]  [phone]  [phone] (Fax)             [place] Follow up.   Specialty:  Rehabilitation Why:  They will contact you for the first appointment. Contact information: [address] [place]         [name], MD -PCP Follow up in 1-2 weeks    I have personally examined this patient, reviewed notes, independently viewed imaging studies, participated in medical decision making and plan of care.ROS completed by me personally and pertinent positives fully documented  I have made any additions or clarifications directly to the above note.  Marland Kitchen He presented with subacute confusion, memory loss and right-sided vision deficit secondary to left posterior cerebral artery embolic infarct source to be identified.He has history of mild Baseline vascular dementia from prior strokes. Recommend patient not drive. Due to  peripheral vision loss. He has high possibility of paroxysmal atrial fibrillation and may need prolonged cardiac monitoring to prove this. Continue aspirin and Plavix for now given his cardiac history. Long discussion with wife at the bedside and answered questions. Recommend outpatient transesophageal echocardiogram and loop recorder for paroxysmal atrial fibrillation. Follow-up in the stroke clinic in 6 weeks. Greater than 50% time during this 25 minute visit was spent on counseling and coordination of care about his stroke and discussion of plan per evaluation and treatment and answering questions  Antony Contras, MD Medical Director [place] Pager: [phone] 06/12/2017 2:31 PM  To contact Stroke Continuity provider, please refer to [email or web address] After hours, contact General Neurology

## 2017-06-15 ENCOUNTER — Telehealth: Payer: Self-pay | Admitting: Neurology

## 2017-06-15 NOTE — Telephone Encounter (Signed)
Ok to do so

## 2017-06-15 NOTE — Telephone Encounter (Signed)
Pt wife (on [place]) has called stating that Dr Leonie Man wants a TEE for pt, wife states pt's cardiologist (Dr Eliezer Bottom) office needs orders faxed over to them, their fax#[phone] ph. [phone].  Pt wife has not requested a call back.

## 2017-06-16 ENCOUNTER — Other Ambulatory Visit: Payer: Self-pay | Admitting: Neurology

## 2017-06-16 ENCOUNTER — Encounter: Payer: Self-pay | Admitting: Family Medicine

## 2017-06-16 DIAGNOSIS — I639 Cerebral infarction, unspecified: Secondary | ICD-10-CM

## 2017-06-16 MED ORDER — TAMSULOSIN HCL 0.4 MG PO CAPS: 0.4000 mg | ORAL_CAPSULE | Freq: Every day | ORAL | 0 refills | Status: DC

## 2017-06-16 NOTE — Telephone Encounter (Signed)
TEE order was signed and fax to [employer] in [place]. Order was signed twice and fax. Form was fax twice and receive , and confirmed.

## 2017-06-16 NOTE — Telephone Encounter (Signed)
Order placed in epic

## 2017-06-18 ENCOUNTER — Other Ambulatory Visit: Payer: Self-pay

## 2017-06-18 ENCOUNTER — Ambulatory Visit: Payer: Medicare HMO | Admitting: Speech Pathology

## 2017-06-18 ENCOUNTER — Ambulatory Visit: Payer: Medicare HMO | Admitting: Occupational Therapy

## 2017-06-18 ENCOUNTER — Ambulatory Visit: Payer: Medicare HMO | Attending: Family Medicine

## 2017-06-18 DIAGNOSIS — I69318 Other symptoms and signs involving cognitive functions following cerebral infarction: Secondary | ICD-10-CM | POA: Insufficient documentation

## 2017-06-18 DIAGNOSIS — R4701 Aphasia: Secondary | ICD-10-CM | POA: Insufficient documentation

## 2017-06-18 DIAGNOSIS — R41841 Cognitive communication deficit: Secondary | ICD-10-CM

## 2017-06-18 DIAGNOSIS — R278 Other lack of coordination: Secondary | ICD-10-CM

## 2017-06-18 DIAGNOSIS — R41840 Attention and concentration deficit: Secondary | ICD-10-CM

## 2017-06-18 DIAGNOSIS — R41842 Visuospatial deficit: Secondary | ICD-10-CM

## 2017-06-18 DIAGNOSIS — I69351 Hemiplegia and hemiparesis following cerebral infarction affecting right dominant side: Secondary | ICD-10-CM | POA: Diagnosis not present

## 2017-06-18 DIAGNOSIS — R41844 Frontal lobe and executive function deficit: Secondary | ICD-10-CM

## 2017-06-18 DIAGNOSIS — R2689 Other abnormalities of gait and mobility: Secondary | ICD-10-CM | POA: Insufficient documentation

## 2017-06-18 DIAGNOSIS — R2681 Unsteadiness on feet: Secondary | ICD-10-CM | POA: Insufficient documentation

## 2017-06-18 DIAGNOSIS — M6281 Muscle weakness (generalized): Secondary | ICD-10-CM | POA: Insufficient documentation

## 2017-06-18 DIAGNOSIS — R414 Neurologic neglect syndrome: Secondary | ICD-10-CM
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
## 2017-06-18 NOTE — Patient Instructions (Signed)
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
## 2017-06-26 ENCOUNTER — Ambulatory Visit: Payer: Medicare HMO

## 2017-06-27 ENCOUNTER — Encounter: Payer: Self-pay | Admitting: Family Medicine

## 2017-06-29 ENCOUNTER — Ambulatory Visit: Payer: Medicare HMO | Admitting: Speech Pathology

## 2017-06-30 ENCOUNTER — Other Ambulatory Visit: Payer: Self-pay | Admitting: Internal Medicine

## 2017-07-01 ENCOUNTER — Other Ambulatory Visit: Payer: Self-pay | Admitting: Student in an Organized Health Care Education/Training Program

## 2017-07-01 DIAGNOSIS — Z8673 Personal history of transient ischemic attack (TIA), and cerebral infarction without residual deficits: Secondary | ICD-10-CM

## 2017-07-01 NOTE — Telephone Encounter (Signed)
Wife called regarding patient's need for a new cardiologist.  Stated that this was an urgent matter and that more details were provided in her mychart message.  Tyrique Sporn,CMA

## 2017-07-01 NOTE — Discharge Summary (Signed)
[facility] Discharge Summary  Patient name: SUHAYB ANZALONE Medical record number: [id number] Date of birth: 1956-02-14 Age: 62 y.o. Gender: male Date of Admission: 06/10/2017  Date of Discharge: 06/12/2017 Admitting Physician: Alveda Reasons, MD  Primary Care Provider: [name], MD Consultants: Neurology  Indication for Hospitalization:   Non-hemorrhagic stroke of left Posterior Cerebral Artery  Discharge Diagnoses/Problem List:   Subacute ischemic stroke of left PCA CAD HFrEF Hx of Prior Stroke  Disposition: home  Discharge Condition: medically stable  Discharge Exam:   General: A&O, NAD Cardiovascular: RRR, normal S1, S2, no murmurs Respiratory: CTAB, no wheezes, no crackles Abdomen: soft, non-distended, non-tender, +bs Neuro: UE strength 5/5, LE strength 5/5 Extremities: no clubbing, no cyanosis, no edema  Brief Hospital Course:  Mr. Jaggar Benko is a 62y/o male with a PMH of stroke involving the left MCA, CAD, and HFrEF that presented to the ED on 06/10/2017 due altered mental status and a new shuffling gait. According to the patient's wife he has been having some progressive mental status changes but prior to Thanksgiving he was in a normal state of health. After Thanksgiving his mental status worsened, his shuffling gait become much more noticeable, and he began endorsing right sided vision loss. A head CT was obtained which was significant for a left sided nonhemorrhagic PCA infarct and chronic infarcts of the bilateral basal ganglia and lacunar infarcts. Follow up MRI/MRA of the brain confirmed what was found on CT. No masses were found. His home HTN medications were held for permissive hypertension for 48 hours. Otherwise, his home medications were given. After 48 hours his HTN medications were restarted as needed. He regained his normal mental status but had retained difficulty ambulating without assistance due to right  sided weakness and he still had right sided vision loss. PT/OT evaluated the patient and recommended short term-nursing facility and stroke rehabilitation which the patient refused. Neurology saw and evaluated the patient and recommended Asprin 325mg  daily and Plavix 75mg  daily. They recommended a follow up outpatient TEE and possible loop recorder vs 30-day heart monitor due to concern for paroxysmal atrial fibrillation. Otherwise, a full stroke work up was performed and risk stratification labs were obtained and were unremarkable. During his course in the hospital the patient had stable vital signs and expressed desire to return home. Since he declined SNF patient was discharged home.  Issues for Follow Up:  1. It has been recommended that he have a TEE due to concern for paroxysmal atrial fibrillation. 2. Patient needs to have follow up with Neurology outpatient. He states he sees Dr. Melrose Nakayama in [place]. He is high risk for having another CVA. 3. Patient has been ordered at home PT but would highly benefit from inpatient rehab. 4. Patient also requires follow up with Cardiology due to HFrEF of 40-45% which was unchanged from his previous echo from 05/24/2017 on a previous admission.  Significant Procedures:   None  Significant Labs and Imaging:   Recent Labs  Lab 06/10/17 1830 06/12/17 0726  WBC 8.4 6.5  HGB 11.8* 11.9*  HCT 35.1* 35.7*  PLT 358 293     LastLabs      Recent Labs  Lab 06/10/17 1830 06/12/17 0726  NA 134* 136  K 3.8 4.1  CL 101 104  CO2 22 22  BUN 21* 17  CREATININE 1.50* 1.31*  CALCIUM 9.1 8.9  PROT 6.7  --   BILITOT 0.9  --   ALKPHOS 19*  --  ALT 18  --   AST 23  --   GLUCOSE 100* 77      12/13 - Troponin I: < 0.03 12/13 - Lipid panel: wnl, exception of HDL low at 26 12/13 - HgbaA1c: 5.4  Imaging/Diagnostic Tests:  12/12 - CT Head w/o Contrast: IMPRESSION: 1. Acute to subacute appearing nonhemorrhagic left PCA distribution infarct with  chronic stable ventriculomegaly. Ventriculomegaly likely related to atrophy. 2. Chronic left insular and bilateral basal ganglial lacunar infarcts. 3. Chronic stable small vessel ischemic change of periventricular white matter.  12/13 - MRI and MRA of Brain and Head: IMPRESSION: MRI HEAD IMPRESSION:  1. Large confluent subacute left PCA territory infarct. Associated petechial hemorrhage without frank hemorrhagic transformation. No significant mass effect. 2. Diffuse ventricular prominence somewhat out of proportion to cortical sulcation, which can be seen in the setting of underlying NPH. No transependymal flow of CSF. 3. Remote left MCA infarct, with additional chronic lacunar infarcts involving the bilateral basal ganglia and thalami.  MRA HEAD IMPRESSION:  1. Negative intracranial MRA for large vessel occlusion. 2. Intracranial atherosclerosis as above. No high-grade or correctable stenosis.  12/13 - Carotid Doppler: Right ICA 40-49%, Left 40-59%  12/13 - Echo: LVEF: 40-45% with akinesis of apical LV. No vavle dysfunction. Grade 1 diastolic dysfunction.   Results/Tests Pending at Time of Discharge:   None  Discharge Medications:  Allergies as of 06/12/2017      Reactions   Penicillins Shortness Of Breath, Rash   Has patient had a PCN reaction causing immediate rash, facial/tongue/throat swelling, SOB or lightheadedness with hypotension: Yes Has patient had a PCN reaction causing severe rash involving mucus membranes or skin necrosis: No Has patient had a PCN reaction that required hospitalization: No Has patient had a PCN reaction occurring within the last 10 years: Yes If all of the above answers are "NO", then may proceed with Cephalosporin use.   Sulfa Antibiotics Hives, Shortness Of Breath, Rash   Tramadol Nausea And Vomiting      Medication List    TAKE these medications   acetaminophen 325 MG tablet Commonly known as:  TYLENOL Take 325-650 mg by  mouth every 6 (six) hours as needed (for headaches).   aspirin 325 MG EC tablet Take 1 tablet (325 mg total) by mouth daily. What changed:    medication strength  how much to take   atorvastatin 40 MG tablet Commonly known as:  LIPITOR Take 1 tablet (40 mg total) by mouth daily.   carvedilol 3.125 MG tablet Commonly known as:  COREG Take 3.125 mg by mouth 2 (two) times daily with a meal.   clopidogrel 75 MG tablet Commonly known as:  PLAVIX Take 75 mg by mouth daily.   gabapentin 100 MG capsule Commonly known as:  NEURONTIN Take 100 mg by mouth 2 (two) times daily.   isosorbide mononitrate 30 MG 24 hr tablet Commonly known as:  IMDUR Take 30 mg by mouth daily.   lisinopril 10 MG tablet Commonly known as:  PRINIVIL,ZESTRIL Take 10 mg by mouth daily.   MULTIVITAMIN ADULT PO Take 1 tablet by mouth daily.   niacin 250 MG tablet Take 250 mg by mouth daily with breakfast.   nitroGLYCERIN 0.4 MG SL tablet Commonly known as:  NITROSTAT Place 0.4 mg under the tongue every 5 (five) minutes as needed for chest pain. May take up to 3 doses.   nortriptyline 10 MG capsule Commonly known as:  PAMELOR Take 10 mg by mouth at bedtime.  senna-docusate 8.6-50 MG tablet Commonly known as:  Senokot-S Take 1 tablet by mouth at bedtime as needed for mild constipation or moderate constipation.   sertraline 50 MG tablet Commonly known as:  ZOLOFT Take 1 tablet (50 mg total) by mouth daily.   TUMS PO Take 500 mg by mouth daily as needed (heartburn).   vitamin B-12 1000 MCG tablet Commonly known as:  CYANOCOBALAMIN Take 1,000 mcg by mouth daily.       Discharge Instructions: Please refer to Patient Instructions section of EMR for full details.  Patient was counseled important signs and symptoms that should prompt return to medical care, changes in medications, dietary instructions, activity restrictions, and follow up appointments.   Follow-Up Appointments: Follow-up  Information    [name],Zachary,MD Neurology. Schedule an appointment as soon as possible for a visit in 6 week(s).   Contact information: [facility]  [place], [place] [address]  [phone]  [phone] (Fax)             [facility] Follow up.   Specialty:  Rehabilitation Why:  They will contact you for the first appointment. Contact information: [address] [place] [id number] [place] [address] [place], [place], DO 07/01/2017, 2:28 PM PGY-1, [place]

## 2017-07-06 ENCOUNTER — Telehealth: Payer: Self-pay | Admitting: Family Medicine

## 2017-07-06 ENCOUNTER — Encounter: Payer: Self-pay | Admitting: Family Medicine

## 2017-07-06 DIAGNOSIS — I6340 Cerebral infarction due to embolism of unspecified cerebral artery: Secondary | ICD-10-CM

## 2017-07-06 NOTE — Telephone Encounter (Signed)
Pt had 2 strokes back before Christmas and has been going to PT at a dr office. Its been costing them 60 a week and they can't afford that. Their insurance [facility]) said for them to call their PCP to get home-based PT through [facility] since its part of their plan and they wouldn't have to pay anything for it. Pt did this back in 2014 after he had a stroke. Pt also would like a referral to a cardiologist by the name Landry Corporal that's in the [facility] system. His number is [phone].

## 2017-07-09 ENCOUNTER — Encounter: Payer: Self-pay | Admitting: Occupational Therapy

## 2017-07-09 MED ORDER — GABAPENTIN 100 MG PO CAPS: 100.0000 mg | ORAL_CAPSULE | Freq: Two times a day (BID) | ORAL | 3 refills | Status: DC

## 2017-07-09 NOTE — Telephone Encounter (Signed)
Routing to Referral Coordinator, Kennyth Lose. Hubbard Hartshorn, RN, BSN

## 2017-07-09 NOTE — Therapy (Signed)
[facility] [address] [place], [place], [address] Phone: [phone]   Fax:  [phone]  Patient Details  Name: Donald [name] MRN: [id number] Date of Birth: Jun 22, 1956 Referring Provider:  No ref. provider found  Encounter Date: 2017-07-14    PHYSICAL THERAPY DISCHARGE SUMMARY  Visits from Start of Care: 1  Current functional level related to goals / functional outcomes: PT Short Term Goals - 06/18/17 1329      PT SHORT TERM GOAL #1   Title  Pt will be IND in HEP to improve strength, balance, coordination, and flexibility TARGET DATE FOR ALL STGS: 07/16/17    Status  New      PT SHORT TERM GOAL #2   Title  Pt will improve DGI score to >/=15/24 to decr. falls risk.     Status  New      PT SHORT TERM GOAL #3   Title  Pt will improve gait speed with LRAD to >/=1.8 ft/sec. to decr. falls risk.     Status  New      PT SHORT TERM GOAL #4   Title  Pt will amb. 300' over even terrain with LRAD at MOD I level to improve functional mobility.     Status  New         Remaining deficits: Unknown as pt never returned, all remaining visits cancelled as he's receiving home health.   Education / Equipment: POC  Plan: Patient agrees to discharge.  Patient goals were not met. Patient is being discharged due to not returning since the last visit.  ?????       G-Codes - July 14, 2017 1027    Functional Assessment Tool Used (Outpatient Only)  DGI: 13/24; gait speed no AD: 1.59 ft/sec.; TUG no AD: 13.37sec. same as eval as pt did not return    Functional Limitation  Mobility: Walking and moving around    Mobility: Walking and Moving Around Goal Status [phone])  At least 20 percent but less than 40 percent impaired, limited or restricted    Mobility: Walking and Moving Around Discharge Status [phone])  At least 40 percent but less than 60 percent impaired, limited or restricted         Kassondra Geil L 07-14-17, 10:28  AM  [facility] [address] [place], [place], [address] Phone: [phone]   Fax:  [phone]    Geoffry Paradise, PT,DPT 07-14-2017 10:28 AM Phone: [phone] Fax: [phone]

## 2017-07-09 NOTE — Therapy (Signed)
[facility] [address] [place], [place], [address] Phone: [phone]   Fax:  [phone]  Patient Details  Name: ANZEL KEARSE MRN: [id number] Date of Birth: 08-15-1955 Referring Provider:  No ref. provider found  Encounter Date: 07/09/2017  OCCUPATIONAL THERAPY DISCHARGE SUMMARY  Visits from Start of Care: 1 (eval)  Current functional level related to goals / functional outcomes: See eval as pt did not return after eval   Remaining deficits: See eval as pt did not return after eval   Education / Equipment: Not completed as pt did not return  Plan: Patient agrees to discharge.  Patient goals were not met. Patient is being discharged due to not returning since the last visit.  Pt did not return after eval.  Received message that pt is receiving home health. ?????         [facility] 07/09/2017, 1:30 PM  [facility] [address] [place], [place], [address] Phone: [phone]   Fax:  [place], OTR/L [facility] [address] [place], [place]  [address] [phone] phone [phone] 07/09/17 1:30 PM

## 2017-07-09 NOTE — Telephone Encounter (Signed)
Received patient email requesting gabapentin refill. Sent refill. Will place cardiology referral. RN team, how do we refer for in home PT as an outpatient? In the hospital we enter a face to face order for case management to coordinate.

## 2017-07-13 ENCOUNTER — Encounter: Payer: Medicare HMO | Admitting: Occupational Therapy

## 2017-07-13 ENCOUNTER — Ambulatory Visit: Payer: Medicare HMO | Admitting: Rehabilitation

## 2017-07-13 NOTE — Telephone Encounter (Signed)
Placed home health PT order after recent stroke. Will route to referral coordinator.

## 2017-07-14 ENCOUNTER — Encounter: Payer: Self-pay | Admitting: Family Medicine

## 2017-07-14 ENCOUNTER — Other Ambulatory Visit: Payer: Self-pay | Admitting: *Deleted

## 2017-07-14 MED ORDER — GABAPENTIN 100 MG PO CAPS: 100.0000 mg | ORAL_CAPSULE | Freq: Two times a day (BID) | ORAL | 3 refills | Status: DC

## 2017-07-16 ENCOUNTER — Ambulatory Visit: Payer: Medicare HMO

## 2017-07-16 ENCOUNTER — Encounter: Payer: Medicare HMO | Admitting: Occupational Therapy

## 2017-07-16 ENCOUNTER — Encounter: Payer: Medicare HMO | Admitting: Speech Pathology

## 2017-07-21 ENCOUNTER — Encounter: Payer: Self-pay | Admitting: Adult Health

## 2017-07-21 ENCOUNTER — Other Ambulatory Visit: Payer: Self-pay
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
## 2017-07-23 ENCOUNTER — Encounter: Payer: Self-pay | Admitting: Cardiovascular Disease

## 2017-07-23 ENCOUNTER — Ambulatory Visit: Payer: Medicare HMO | Admitting: Adult Health

## 2017-07-23 ENCOUNTER — Encounter: Payer: Medicare HMO | Admitting: Occupational Therapy

## 2017-07-23 ENCOUNTER — Encounter: Payer: Medicare HMO | Admitting: Speech Pathology

## 2017-07-23 ENCOUNTER — Other Ambulatory Visit: Payer: Self-pay | Admitting: Internal Medicine

## 2017-07-23 ENCOUNTER — Ambulatory Visit: Payer: Medicare HMO | Admitting: Rehabilitation

## 2017-07-23 ENCOUNTER — Encounter: Payer: Self-pay | Admitting: Adult Health

## 2017-07-23 VITALS — BP 115/60 | HR 71 | Ht 64.0 in | Wt 146.0 lb

## 2017-07-23 DIAGNOSIS — I10 Essential (primary) hypertension: Secondary | ICD-10-CM | POA: Diagnosis not present

## 2017-07-23 DIAGNOSIS — I428 Other cardiomyopathies: Secondary | ICD-10-CM | POA: Diagnosis not present

## 2017-07-23 DIAGNOSIS — E7800 Pure hypercholesterolemia, unspecified: Secondary | ICD-10-CM

## 2017-07-23 DIAGNOSIS — I2510 Atherosclerotic heart disease of native coronary artery without angina pectoris: Secondary | ICD-10-CM

## 2017-07-23 DIAGNOSIS — I63549 Cerebral infarction due to unspecified occlusion or stenosis of unspecified cerebellar artery: Secondary | ICD-10-CM | POA: Diagnosis not present

## 2017-07-23 DIAGNOSIS — Z79899 Other long term (current) drug therapy: Secondary | ICD-10-CM

## 2017-07-23 MED ORDER — GABAPENTIN 100 MG PO CAPS: 100.0000 mg | ORAL_CAPSULE | Freq: Two times a day (BID) | ORAL | 3 refills | Status: DC

## 2017-07-23 NOTE — Patient Instructions (Signed)
Testing/Procedures: Your physician has requested that you have a TEE w/ Loop recorder insertion. During a TEE, sound waves are used to create images of your heart. It provides your doctor with information about the size and shape of your heart and how well your heart's chambers and valves are working. In this test, a transducer is attached to the end of a flexible tube that's guided down your throat and into your esophagus (the tube leading from you mouth to your stomach) to get a more detailed image of your heart. You are not awake for the procedure. Please see the instruction sheet given to you today. For further information please visit [email or web address].   Follow-Up: Your physician wants you to follow-up: AFTER TEE/LOOP.   Thank you for choosing [facility] at [facility]!!      Dear Donald Donald [name], Donald [name] are scheduled for a TEE W/loop recorder insertion on     with Dr. Sallyanne Kuster.  Please arrive at the [facility] (Main Entrance A) at [facility]: [address] [place], [place] [address] at    am/pm. (1 hour prior to procedure unless lab work is needed; if lab work is needed arrive 1.5 hours ahead)  DIET: Nothing to eat or drink after midnight except a sip of water with medications (see medication instructions below)  Medication Instructions: Take all morning medications   Labs: Done day of OV  You must have a responsible person to drive you home and stay in the waiting area during your procedure. Failure to do so could result in cancellation.  Bring your insurance cards.  *Special Note: Every effort is made to have your procedure done on time. Occasionally there are emergencies that occur at the hospital that may cause delays. Please be patient if a delay does occur.

## 2017-07-23 NOTE — H&P (View-Only) (Signed)
I have seen and examined the patient along with Curt Bears, NP.  I have reviewed the chart, notes and new data.  I agree with NP's note.  Key new complaints: Visual field cut, sequelae of previous stroke, no new neurological complaints in last few weeks Key examination changes: Regular rate and rhythm, normal cardiovascular exam except for early peaking aortic ejection murmur Key new findings / data: Reviewed records from hospital stay, office visit with Dr. Leonie Man, imaging studies  PLAN: Will go ahead with planned transesophageal echocardiogram, but I also think he needs some type of arrhythmia monitoring, at minimum a 30 day event monitor. Also reviewed with him the increased year old of an implantable loop recorder, albeit with a more invasive procedure. We agreed to go ahead with the implantable loop recorder.  Both procedures have been fully reviewed with the patient and written informed consent has been obtained.   Sanda Klein, MD, [place] [phone] 07/23/2017, 11:02 AM

## 2017-07-23 NOTE — Progress Notes (Signed)
I have seen and examined the patient along with Curt Bears, NP.  I have reviewed the chart, notes and new data.  I agree with NP's note.  Key new complaints: Visual field cut, sequelae of previous stroke, no new neurological complaints in last few weeks Key examination changes: Regular rate and rhythm, normal cardiovascular exam except for early peaking aortic ejection murmur Key new findings / data: Reviewed records from hospital stay, office visit with Dr. Leonie Man, imaging studies  PLAN: Will go ahead with planned transesophageal echocardiogram, but I also think he needs some type of arrhythmia monitoring, at minimum a 30 day event monitor. Also reviewed with him the increased year old of an implantable loop recorder, albeit with a more invasive procedure. We agreed to go ahead with the implantable loop recorder.  Both procedures have been fully reviewed with the patient and written informed consent has been obtained.   Sanda Klein, MD, [place] [phone] 07/23/2017, 11:02 AM

## 2017-07-23 NOTE — Addendum Note (Signed)
Addended by: Lendon Colonel on: 07/23/2017 10:55 AM   Modules accepted: Orders, SmartSet

## 2017-07-24 ENCOUNTER — Ambulatory Visit: Payer: Medicare HMO | Admitting: Rehabilitation

## 2017-07-24 ENCOUNTER — Encounter: Payer: Medicare HMO | Admitting: Occupational Therapy

## 2017-07-24 LAB — BASIC METABOLIC PANEL
BUN/Creatinine Ratio: 14 (ref 10–24)
BUN: 20 mg/dL (ref 8–27)
CALCIUM: 9.2 mg/dL (ref 8.6–10.2)
CO2: 22 mmol/L (ref 20–29)
Chloride: 104 mmol/L (ref 96–106)
Creatinine, Ser: 1.42 mg/dL — ABNORMAL HIGH (ref 0.76–1.27)
GFR calc Af Amer: 61 mL/min/{1.73_m2} (ref >59)
GFR, EST NON AFRICAN AMERICAN: 53 mL/min/{1.73_m2} — AB (ref >59)
GLUCOSE: 127 mg/dL — AB (ref 65–99)
POTASSIUM: 4.8 mmol/L (ref 3.5–5.2)
Sodium: 142 mmol/L (ref 134–144)

## 2017-07-24 LAB — CBC
HEMATOCRIT: 33.8 % — AB (ref 37.5–51.0)
HEMOGLOBIN: 11.2 g/dL — AB (ref 13.0–17.7)
MCH: 28.9 pg (ref 26.6–33.0)
MCHC: 33.1 g/dL (ref 31.5–35.7)
MCV: 87 fL (ref 79–97)
Platelets: 265 10*3/uL (ref 150–379)
RBC: 3.88 x10E6/uL — ABNORMAL LOW (ref 4.14–5.80)
RDW: 13.7 % (ref 12.3–15.4)
WBC: 6.7 10*3/uL (ref 3.4–10.8)

## 2017-07-27 ENCOUNTER — Ambulatory Visit: Payer: Medicare HMO | Admitting: Rehabilitation

## 2017-07-27 ENCOUNTER — Encounter: Payer: Medicare HMO | Admitting: Occupational Therapy

## 2017-07-27 ENCOUNTER — Encounter: Payer: Medicare HMO | Admitting: Speech Pathology

## 2017-07-28 ENCOUNTER — Telehealth: Payer: Self-pay | Admitting: Family Medicine

## 2017-07-28 DIAGNOSIS — I6300 Cerebral infarction due to thrombosis of unspecified precerebral artery: Secondary | ICD-10-CM

## 2017-07-28 NOTE — Telephone Encounter (Signed)
Pt has been waiting for a referral to be placed since beginning of January. Pt needs a referral placed for home health aide in his home for PT, OT and speech therapy all I one referral. That's the only way [employer] will cover it is if they are all three put in together. I asked if there was a preference of where it placed and it doesn't matter as long as [employer] will cover it. Please advise

## 2017-07-28 NOTE — Telephone Encounter (Signed)
Placed home health PT order on 07/13/17. I will place SLP and OT orders now. Colletta Maryland, is there anything else I need to do?

## 2017-07-29 ENCOUNTER — Encounter (HOSPITAL_COMMUNITY): Payer: Self-pay

## 2017-07-29 ENCOUNTER — Encounter (HOSPITAL_COMMUNITY)
Admission: RE | Disposition: A | Discharge status: Home / Self Care | Payer: Self-pay | Source: Ambulatory Visit | Attending: Cardiovascular Disease

## 2017-07-29 ENCOUNTER — Ambulatory Visit (HOSPITAL_COMMUNITY)
Admission: RE | Admit: 2017-07-29 | Discharge: 2017-07-29 | Disposition: A | Discharge status: Home / Self Care | Payer: Medicare HMO | Source: Ambulatory Visit | Attending: Cardiovascular Disease | Admitting: Cardiovascular Disease

## 2017-07-29 ENCOUNTER — Other Ambulatory Visit: Payer: Self-pay

## 2017-07-29 ENCOUNTER — Ambulatory Visit (HOSPITAL_BASED_OUTPATIENT_CLINIC_OR_DEPARTMENT_OTHER)
Admission: RE | Admit: 2017-07-29 | Discharge: 2017-07-29 | Disposition: A | Discharge status: Home / Self Care | Payer: Medicare HMO | Source: Ambulatory Visit | Attending: Adult Health | Admitting: Adult Health

## 2017-07-29 DIAGNOSIS — Z8673 Personal history of transient ischemic attack (TIA), and cerebral infarction without residual deficits: Secondary | ICD-10-CM | POA: Diagnosis present

## 2017-07-29 DIAGNOSIS — Z79899 Other long term (current) drug therapy: Secondary | ICD-10-CM | POA: Insufficient documentation

## 2017-07-29 DIAGNOSIS — E039 Hypothyroidism, unspecified: Secondary | ICD-10-CM | POA: Insufficient documentation

## 2017-07-29 DIAGNOSIS — I6389 Other cerebral infarction: Secondary | ICD-10-CM | POA: Diagnosis not present

## 2017-07-29 DIAGNOSIS — Z7902 Long term (current) use of antithrombotics/antiplatelets: Secondary | ICD-10-CM | POA: Diagnosis not present

## 2017-07-29 DIAGNOSIS — I509 Heart failure, unspecified: Secondary | ICD-10-CM | POA: Diagnosis not present

## 2017-07-29 DIAGNOSIS — I63549 Cerebral infarction due to unspecified occlusion or stenosis of unspecified cerebellar artery: Secondary | ICD-10-CM

## 2017-07-29 DIAGNOSIS — Z7982 Long term (current) use of aspirin: Secondary | ICD-10-CM | POA: Insufficient documentation

## 2017-07-29 DIAGNOSIS — I428 Other cardiomyopathies: Secondary | ICD-10-CM

## 2017-07-29 DIAGNOSIS — R5381 Other malaise: Secondary | ICD-10-CM | POA: Diagnosis not present

## 2017-07-29 DIAGNOSIS — K219 Gastro-esophageal reflux disease without esophagitis: Secondary | ICD-10-CM | POA: Diagnosis not present

## 2017-07-29 DIAGNOSIS — I6300 Cerebral infarction due to thrombosis of unspecified precerebral artery: Secondary | ICD-10-CM

## 2017-07-29 DIAGNOSIS — I69398 Other sequelae of cerebral infarction: Secondary | ICD-10-CM | POA: Diagnosis not present

## 2017-07-29 DIAGNOSIS — I2510 Atherosclerotic heart disease of native coronary artery without angina pectoris: Secondary | ICD-10-CM | POA: Insufficient documentation

## 2017-07-29 DIAGNOSIS — H538 Other visual disturbances: Secondary | ICD-10-CM | POA: Diagnosis not present

## 2017-07-29 DIAGNOSIS — Z87891 Personal history of nicotine dependence: Secondary | ICD-10-CM | POA: Insufficient documentation

## 2017-07-29 DIAGNOSIS — I671 Cerebral aneurysm, nonruptured: Secondary | ICD-10-CM | POA: Insufficient documentation

## 2017-07-29 HISTORY — PX: TEE WITHOUT CARDIOVERSION: SHX5443

## 2017-07-29 HISTORY — PX: LOOP RECORDER INSERTION: EP1214

## 2017-07-29 SURGERY — ECHOCARDIOGRAM, TRANSESOPHAGEAL
Anesthesia: Moderate Sedation

## 2017-07-29 SURGERY — LOOP RECORDER INSERTION

## 2017-07-29 MED ORDER — FENTANYL CITRATE (PF) 100 MCG/2ML IJ SOLN
INTRAMUSCULAR | Status: DC | PRN
  Administered 2017-07-29 (×2): 25 ug via INTRAVENOUS

## 2017-07-29 MED ORDER — BUTAMBEN-TETRACAINE-BENZOCAINE 2-2-14 % EX AERO
INHALATION_SPRAY | CUTANEOUS | Status: DC | PRN
  Administered 2017-07-29: 2 via TOPICAL

## 2017-07-29 MED ORDER — LIDOCAINE-EPINEPHRINE 1 %-1:100000 IJ SOLN
INTRAMUSCULAR | Status: AC
  Filled 2017-07-29: qty 1

## 2017-07-29 MED ORDER — FENTANYL CITRATE (PF) 100 MCG/2ML IJ SOLN
INTRAMUSCULAR | Status: AC
  Filled 2017-07-29: qty 2

## 2017-07-29 MED ORDER — LIDOCAINE-EPINEPHRINE 1 %-1:100000 IJ SOLN
INTRAMUSCULAR | Status: DC | PRN
  Administered 2017-07-29: 3 mL

## 2017-07-29 MED ORDER — MIDAZOLAM HCL 10 MG/2ML IJ SOLN
INTRAMUSCULAR | Status: DC | PRN
  Administered 2017-07-29: 1 mg via INTRAVENOUS
  Administered 2017-07-29 (×2): 2 mg via INTRAVENOUS

## 2017-07-29 MED ORDER — SODIUM CHLORIDE 0.9 % IV SOLN
INTRAVENOUS | Status: DC
  Administered 2017-07-29: 500 mL via INTRAVENOUS

## 2017-07-29 MED ORDER — MIDAZOLAM HCL 5 MG/ML IJ SOLN
INTRAMUSCULAR | Status: AC
Start: 2017-07-29 — End: ?
  Filled 2017-07-29: qty 2

## 2017-07-29 SURGICAL SUPPLY — 2 items
LOOP REVEAL LINQSYS (Prosthesis & Implant Heart) ×3 IMPLANT
PACK LOOP INSERTION (CUSTOM PROCEDURE TRAY) ×3 IMPLANT

## 2017-07-29 NOTE — Op Note (Signed)
INDICATIONS: stroke  PROCEDURE:   Informed consent was obtained prior to the procedure. The risks, benefits and alternatives for the procedure were discussed and the patient comprehended these risks.  Risks include, but are not limited to, cough, sore throat, vomiting, nausea, somnolence, esophageal and stomach trauma or perforation, bleeding, low blood pressure, aspiration, pneumonia, infection, trauma to the teeth and death.    After a procedural time-out, the oropharynx was anesthetized with 20% benzocaine spray.   During this procedure the patient was administered a total of Versed 5 mg and Fentanyl 50 mcg to achieve and maintain moderate conscious sedation.  The patient's heart rate, blood pressure, and oxygen saturationweare monitored continuously during the procedure. The period of conscious sedation was 18 minutes, of which I was present face-to-face 100% of this time.  The transesophageal probe was inserted in the esophagus and stomach without difficulty and multiple views were obtained.  The patient was kept under observation until the patient left the procedure room.  The patient left the procedure room in stable condition.   Agitated microbubble saline contrast was administered.  COMPLICATIONS:    There were no immediate complications.  FINDINGS:  LV apical aneurysm, without clot. LVEF 40-45%. No valvular abnormalities. No LA thrombus, no ASD/PFO. Equipment failure at the end of the study precluded visualization of the aortic arch and descending thoracic aorta.  RECOMMENDATIONS:     Evaluate for CAD. Proceed with loop recorder.  Time Spent Directly with the Patient:  30 minutes   Ontario Pettengill 07/29/2017, 10:38 AM

## 2017-07-29 NOTE — Op Note (Signed)
LOOP RECORDER IMPLANT   Procedure report  Procedure performed:  Loop recorder implantation   Reason for procedure:  1. Cryptogenic stroke Procedure performed by:  Sanda Klein, MD  Complications:  None  Estimated blood loss:  <5 mL  Medications administered during procedure:  Lidocaine 1% with 1/10,000 epinephrine 10 mL locally Device details:  Medtronic Reveal Linq model number [id number], serial number [id number] Procedure details:  After the risks and benefits of the procedure were discussed the patient provided informed consent. The patient was prepped and draped in usual sterile fashion. Local anesthesia was administered to an area 2 cm to the left of the sternum in the 4th intercostal space. A cutaneous incision was made using the incision tool. The introducer was then used to create a subcutaneous tunnel and carefully deploy the device. Local pressure was held to ensure hemostasis.  The incision was closed with SteriStrips and a sterile dressing was applied. R waves 0.33 mV.  Sanda Klein, MD, [place] [phone] office [phone] pager 07/29/2017 11:21 AM

## 2017-07-29 NOTE — Progress Notes (Signed)
  Echocardiogram 2D Echocardiogram has been performed.  Duron Meister T Andrienne Havener 07/29/2017, 10:55 AM

## 2017-07-29 NOTE — Interval H&P Note (Signed)
History and Physical Interval Note:  07/29/2017 10:16 AM  Donald [name]  has presented today for surgery, with the diagnosis of AFIB  The various methods of treatment have been discussed with the patient and family. After consideration of risks, benefits and other options for treatment, the patient has consented to  Procedure(s): TRANSESOPHAGEAL ECHOCARDIOGRAM (TEE) (N/A) as a surgical intervention .  The patient's history has been reviewed, patient examined, no change in status, stable for surgery.  I have reviewed the patient's chart and labs.  Questions were answered to the patient's satisfaction.     Nanna Ertle

## 2017-07-29 NOTE — Interval H&P Note (Signed)
History and Physical Interval Note:  07/29/2017 10:16 AM  Donald [name]  has presented today for surgery, with the diagnosis of AFIB  The various methods of treatment have been discussed with the patient and family. After consideration of risks, benefits and other options for treatment, the patient has consented to  Procedure(s): TRANSESOPHAGEAL ECHOCARDIOGRAM (TEE) (N/A) as a surgical intervention .  The patient's history has been reviewed, patient examined, no change in status, stable for surgery.  I have reviewed the patient's chart and labs.  Questions were answered to the patient's satisfaction.     Boris Engelmann

## 2017-07-29 NOTE — Interval H&P Note (Signed)
History and Physical Interval Note:  07/29/2017 11:21 AM  Donald [name]  has presented today for surgery, with the diagnosis of afib  The various methods of treatment have been discussed with the patient and family. After consideration of risks, benefits and other options for treatment, the patient has consented to  Procedure(s): LOOP RECORDER INSERTION (N/A) as a surgical intervention .  The patient's history has been reviewed, patient examined, no change in status, stable for surgery.  I have reviewed the patient's chart and labs.  Questions were answered to the patient's satisfaction.     Larance Ratledge

## 2017-07-29 NOTE — Discharge Instructions (Signed)

## 2017-07-30 ENCOUNTER — Encounter: Payer: Medicare HMO | Admitting: Speech Pathology

## 2017-07-30 ENCOUNTER — Encounter (HOSPITAL_COMMUNITY): Payer: Self-pay | Admitting: Cardiovascular Disease

## 2017-07-30 ENCOUNTER — Ambulatory Visit: Payer: Medicare HMO | Admitting: Rehabilitation

## 2017-07-30 ENCOUNTER — Telehealth: Payer: Self-pay | Admitting: Cardiovascular Disease

## 2017-07-30 ENCOUNTER — Encounter: Payer: Medicare HMO | Admitting: Occupational Therapy

## 2017-07-30 NOTE — Telephone Encounter (Signed)
New Message    Patient is returning call that he received today. Please call to discuss.

## 2017-07-30 NOTE — Telephone Encounter (Signed)
Reviewed results w/Pt see result note

## 2017-07-31 ENCOUNTER — Telehealth: Payer: Self-pay | Admitting: Cardiovascular Disease

## 2017-07-31 NOTE — Telephone Encounter (Signed)
Closed encounter °

## 2017-08-03 ENCOUNTER — Encounter: Payer: Medicare HMO | Admitting: Occupational Therapy

## 2017-08-03 ENCOUNTER — Ambulatory Visit: Payer: Medicare HMO | Admitting: Rehabilitation

## 2017-08-03 ENCOUNTER — Encounter: Payer: Medicare HMO | Admitting: Speech Pathology

## 2017-08-04 ENCOUNTER — Other Ambulatory Visit: Payer: Self-pay | Admitting: *Deleted

## 2017-08-04 MED ORDER — SERTRALINE HCL 50 MG PO TABS: 50.0000 mg | ORAL_TABLET | Freq: Every day | ORAL | 2 refills | Status: DC

## 2017-08-04 NOTE — Telephone Encounter (Signed)
Opened in error. Donald [name], [name], [place]

## 2017-08-06 ENCOUNTER — Encounter: Payer: Medicare HMO | Admitting: Speech Pathology

## 2017-08-06 ENCOUNTER — Encounter: Payer: Medicare HMO | Admitting: Occupational Therapy

## 2017-08-06 ENCOUNTER — Ambulatory Visit: Payer: Medicare HMO | Admitting: Rehabilitation

## 2017-08-07 ENCOUNTER — Telehealth: Payer: Self-pay | Admitting: Family Medicine

## 2017-08-07 NOTE — Telephone Encounter (Signed)
Pt has only been receiving PT from [facility].  He was suppose to received  Speech therapy and occupational therapyy.  See note from [place] dated 07-01-17.  They were ordered together. [employer] says they didn't receive the orders for speech and OT.  Pts wife is requesting Dr Lindell Noe call rather than fax the referrals. Please advise

## 2017-08-07 NOTE — Telephone Encounter (Signed)
Colletta Maryland or RN team, can you comment on the status of this or check with [employer]? Did I place the referrals correctly?

## 2017-08-08 ENCOUNTER — Encounter: Payer: Self-pay | Admitting: Family Medicine

## 2017-08-11 ENCOUNTER — Encounter: Payer: Medicare HMO | Admitting: Speech Pathology

## 2017-08-11 ENCOUNTER — Ambulatory Visit: Payer: Medicare HMO

## 2017-08-11 ENCOUNTER — Telehealth: Payer: Self-pay | Admitting: Family Medicine

## 2017-08-11 ENCOUNTER — Ambulatory Visit: Payer: Medicare HMO | Admitting: Neurology

## 2017-08-11 ENCOUNTER — Encounter: Payer: Medicare HMO | Admitting: Occupational Therapy

## 2017-08-11 ENCOUNTER — Encounter: Payer: Self-pay | Admitting: Neurology

## 2017-08-11 VITALS — BP 112/67 | HR 74 | Wt 148.8 lb

## 2017-08-11 DIAGNOSIS — I10 Essential (primary) hypertension: Secondary | ICD-10-CM | POA: Diagnosis not present

## 2017-08-11 DIAGNOSIS — E785 Hyperlipidemia, unspecified: Secondary | ICD-10-CM | POA: Diagnosis not present

## 2017-08-11 DIAGNOSIS — I639 Cerebral infarction, unspecified: Secondary | ICD-10-CM

## 2017-08-11 NOTE — Patient Instructions (Signed)
I had a long d/w patient about his recent stroke, risk for recurrent stroke/TIAs, personally independently reviewed imaging studies and stroke evaluation results and answered questions.Continue aspirin 325 mg daily and stop Plavix 75mg  for secondary stroke prevention and maintain strict control of hypertension with blood pressure goal below 130/90, diabetes with hemoglobin A1c goal below 6.5% and lipids with LDL cholesterol goal below 70 mg/dL. I also advised the patient to eat a healthy diet with plenty of whole grains, cereals, fruits and vegetables, exercise regularly and maintain ideal body weight Followup in the future with Janett Billow, NP in 6 months.  -stop taking Plavix 75mg  and continue aspirin 325mg  only -continue to follow loop recorder and you will be notified if atrial fibrillation is found and your blood thinners will be changed at that time by your cardiologist -schedule home PT/OT/ST  Memory Compensation Strategies  1. Use "WARM" strategy.  W= write it down  A= associate it  R= repeat it  M= make a mental note  2.   You can keep a Social worker.  Use a 3-ring notebook with sections for the following: calendar, important names and phone numbers,  medications, doctors' names/phone numbers, lists/reminders, and a section to journal what you did  each day.   3.    Use a calendar to write appointments down.  4.    Write yourself a schedule for the day.  This can be placed on the calendar or in a separate section of the Memory Notebook.  Keeping a  regular schedule can help memory.  5.    Use medication organizer with sections for each day or morning/evening pills.  You may need help loading it  6.    Keep a basket, or pegboard by the door.  Place items that you need to take out with you in the basket or on the pegboard.  You may also want to  include a message board for reminders.  7.    Use sticky notes.  Place sticky notes with reminders in a place where the task is performed.   For example: " turn off the  stove" placed by the stove, "lock the door" placed on the door at eye level, " take your medications" on  the bathroom mirror or by the place where you normally take your medications.  8.    Use alarms/timers.  Use while cooking to remind yourself to check on food or as a reminder to take your medicine, or as a  reminder to make a call, or as a reminder to perform another task, etc.     Stroke Prevention Some health problems and behaviors may make it more likely for you to have a stroke. Below are ways to lessen your risk of having a stroke.  Be active for at least 30 minutes on most or all days.  Do not smoke. Try not to be around others who smoke.  Do not drink too much alcohol. ? Do not have more than 2 drinks a day if you are a man. ? Do not have more than 1 drink a day if you are a woman and are not pregnant.  Eat healthy foods, such as fruits and vegetables. If you were put on a specific diet, follow the diet as told.  Keep your cholesterol levels under control through diet and medicines. Look for foods that are low in saturated fat, trans fat, cholesterol, and are high in fiber.  If you have diabetes, follow all diet plans and  take your medicine as told.  Ask your doctor if you need treatment to lower your blood pressure. If you have high blood pressure (hypertension), follow all diet plans and take your medicine as told by your doctor.  If you are 80-92 years old, have your blood pressure checked every 3-5 years. If you are age 30 or older, have your blood pressure checked every year.  Keep a healthy weight. Eat foods that are low in calories, salt, saturated fat, trans fat, and cholesterol.  Do not take drugs.  Avoid birth control pills, if this applies. Talk to your doctor about the risks of taking birth control pills.  Talk to your doctor if you have sleep problems (sleep apnea).  Take all medicine as told by your doctor. ? You may be told  to take aspirin or blood thinner medicine. Take this medicine as told by your doctor. ? Understand your medicine instructions.  Make sure any other conditions you have are being taken care of.  Get help right away if:  You suddenly lose feeling (you feel numb) or have weakness in your face, arm, or leg.  Your face or eyelid hangs down to one side.  You suddenly feel confused.  You have trouble talking (aphasia) or understanding what people are saying.  You suddenly have trouble seeing in one or both eyes.  You suddenly have trouble walking.  You are dizzy.  You lose your balance or your movements are clumsy (uncoordinated).  You suddenly have a very bad headache and you do not know the cause.  You have new chest pain.  Your heart feels like it is fluttering or skipping a beat (irregular heartbeat). Do not wait to see if the symptoms above go away. Get help right away. Call your local emergency services (911 in [place] Do not drive yourself to the hospital. This information is not intended to replace advice given to you by your health care provider. Make sure you discuss any questions you have with your health care provider. Document Released: 12/16/2011 Document Revised: 11/22/2015 Document Reviewed: 12/17/2012 Elsevier Interactive Patient Education  [employer].
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
## 2017-08-11 NOTE — Telephone Encounter (Signed)
Lauren d/w Col[place], all referrals from our side were completed. Wife seen yesrterday, she will call [employer].

## 2017-08-11 NOTE — Telephone Encounter (Signed)
Pt wife called and said this pt was seen yesterday and were told by the dr that she would send all the referrals for this pt's in home care. The wife called [employer] today and they need some authorization code from the dr and that the dr needs to call them to give it to them. The phone number is [phone]. The wife said they have been trying to get these services for over a month now and she's wanting to get this done asap. Please advise

## 2017-08-12 ENCOUNTER — Ambulatory Visit (INDEPENDENT_AMBULATORY_CARE_PROVIDER_SITE_OTHER): Payer: Self-pay | Admitting: *Deleted

## 2017-08-12 DIAGNOSIS — I6300 Cerebral infarction due to thrombosis of unspecified precerebral artery: Secondary | ICD-10-CM

## 2017-08-12 NOTE — Progress Notes (Signed)
Loop check in clinic. Battery status: good. R-waves 0.51mV. 0 symptom episodes, 0 tachy episodes, 0 pause episodes, 0 brady episodes. 1 AF episodes under/oversensing. Monthly summary reports.

## 2017-08-12 NOTE — Telephone Encounter (Signed)
[name], I think this is a prior auth? Could you help? I tried calling and they couldn't pull up my NPI, maybe because of residency? Do we need to do it under the attending? The [employer] representative was not very helpful and said that the PCP is Dr. Nori Riis (maybe this is for medicare?), but he couldn't do anything else in terms of progressing through the authorization. Is there an outpatient case manager that can help? They do this from the hospital all the time.

## 2017-08-13 ENCOUNTER — Telehealth: Payer: Self-pay | Admitting: Family Medicine

## 2017-08-13 ENCOUNTER — Encounter: Payer: Medicare HMO | Admitting: Occupational Therapy

## 2017-08-13 ENCOUNTER — Encounter: Payer: Medicare HMO | Admitting: Speech Pathology

## 2017-08-13 ENCOUNTER — Ambulatory Visit: Payer: Medicare HMO | Admitting: Rehabilitation

## 2017-08-13 DIAGNOSIS — I6300 Cerebral infarction due to thrombosis of unspecified precerebral artery: Secondary | ICD-10-CM

## 2017-08-17 ENCOUNTER — Ambulatory Visit: Payer: Medicare HMO | Admitting: Rehabilitation

## 2017-08-17 ENCOUNTER — Encounter: Payer: Medicare HMO | Admitting: Occupational Therapy

## 2017-08-17 ENCOUNTER — Other Ambulatory Visit: Payer: Self-pay | Admitting: *Deleted

## 2017-08-17 ENCOUNTER — Encounter: Payer: Medicare HMO | Admitting: Speech Pathology

## 2017-08-17 MED ORDER — SERTRALINE HCL 50 MG PO TABS: 50.0000 mg | ORAL_TABLET | Freq: Every day | ORAL | 3 refills | Status: DC

## 2017-08-20 ENCOUNTER — Encounter: Payer: Medicare HMO | Admitting: Occupational Therapy

## 2017-08-20 ENCOUNTER — Ambulatory Visit: Payer: Medicare HMO | Admitting: Rehabilitation

## 2017-08-20 ENCOUNTER — Encounter: Payer: Medicare HMO | Admitting: Speech Pathology

## 2017-08-24 ENCOUNTER — Encounter: Payer: Medicare HMO | Admitting: Occupational Therapy

## 2017-08-24 ENCOUNTER — Ambulatory Visit: Payer: Medicare HMO | Admitting: Rehabilitation

## 2017-08-24 ENCOUNTER — Encounter: Payer: Medicare HMO | Admitting: Speech Pathology

## 2017-08-24 ENCOUNTER — Other Ambulatory Visit: Payer: Self-pay | Admitting: Family Medicine

## 2017-08-24 DIAGNOSIS — I6300 Cerebral infarction due to thrombosis of unspecified precerebral artery: Secondary | ICD-10-CM

## 2017-08-24 DIAGNOSIS — I671 Cerebral aneurysm, nonruptured: Secondary | ICD-10-CM

## 2017-08-24 NOTE — Progress Notes (Unsigned)
Placed new home health orders as most recent orders are now expired (placed 08/13/17) per [facility].

## 2017-08-27 ENCOUNTER — Encounter: Payer: Medicare HMO | Admitting: Occupational Therapy

## 2017-08-27 ENCOUNTER — Encounter: Payer: Medicare HMO | Admitting: Speech Pathology

## 2017-08-27 ENCOUNTER — Ambulatory Visit: Payer: Medicare HMO | Admitting: Rehabilitation

## 2017-08-28 ENCOUNTER — Ambulatory Visit (INDEPENDENT_AMBULATORY_CARE_PROVIDER_SITE_OTHER): Payer: Medicare HMO | Admitting: *Deleted

## 2017-08-28 DIAGNOSIS — I6300 Cerebral infarction due to thrombosis of unspecified precerebral artery: Secondary | ICD-10-CM

## 2017-08-28 NOTE — Telephone Encounter (Signed)
Encounter opened for home health orders.

## 2017-08-28 NOTE — Progress Notes (Signed)
Carelink Summary Report / Loop Recorder 

## 2017-08-31 ENCOUNTER — Ambulatory Visit: Payer: Medicare HMO | Admitting: Rehabilitation

## 2017-08-31 ENCOUNTER — Encounter: Payer: Medicare HMO | Admitting: Speech Pathology

## 2017-08-31 ENCOUNTER — Encounter: Payer: Medicare HMO | Admitting: Occupational Therapy

## 2017-09-03 DIAGNOSIS — I6300 Cerebral infarction due to thrombosis of unspecified precerebral artery: Secondary | ICD-10-CM | POA: Diagnosis not present

## 2017-09-03 DIAGNOSIS — I10 Essential (primary) hypertension: Secondary | ICD-10-CM | POA: Diagnosis not present

## 2017-09-03 DIAGNOSIS — I509 Heart failure, unspecified: Secondary | ICD-10-CM | POA: Diagnosis not present

## 2017-09-04 DIAGNOSIS — I6300 Cerebral infarction due to thrombosis of unspecified precerebral artery: Secondary | ICD-10-CM | POA: Diagnosis not present

## 2017-09-04 DIAGNOSIS — I509 Heart failure, unspecified: Secondary | ICD-10-CM | POA: Diagnosis not present

## 2017-09-04 DIAGNOSIS — I10 Essential (primary) hypertension: Secondary | ICD-10-CM | POA: Diagnosis not present

## 2017-09-07 DIAGNOSIS — I10 Essential (primary) hypertension: Secondary | ICD-10-CM | POA: Diagnosis not present

## 2017-09-07 DIAGNOSIS — I509 Heart failure, unspecified: Secondary | ICD-10-CM | POA: Diagnosis not present

## 2017-09-07 DIAGNOSIS — I6300 Cerebral infarction due to thrombosis of unspecified precerebral artery: Secondary | ICD-10-CM | POA: Diagnosis not present

## 2017-09-08 DIAGNOSIS — I509 Heart failure, unspecified: Secondary | ICD-10-CM | POA: Diagnosis not present

## 2017-09-08 DIAGNOSIS — I6300 Cerebral infarction due to thrombosis of unspecified precerebral artery: Secondary | ICD-10-CM | POA: Diagnosis not present

## 2017-09-08 DIAGNOSIS — I10 Essential (primary) hypertension: Secondary | ICD-10-CM | POA: Diagnosis not present

## 2017-09-10 ENCOUNTER — Other Ambulatory Visit: Payer: Self-pay | Admitting: Family Medicine

## 2017-09-10 ENCOUNTER — Telehealth: Payer: Self-pay

## 2017-09-10 DIAGNOSIS — I6300 Cerebral infarction due to thrombosis of unspecified precerebral artery: Secondary | ICD-10-CM | POA: Diagnosis not present

## 2017-09-10 DIAGNOSIS — I509 Heart failure, unspecified: Secondary | ICD-10-CM | POA: Diagnosis not present

## 2017-09-10 DIAGNOSIS — I10 Essential (primary) hypertension: Secondary | ICD-10-CM | POA: Diagnosis not present

## 2017-09-10 NOTE — Telephone Encounter (Signed)
Stormy Card, speech therapist with [facility] calling requesting verbal orders for patient to receive speech therapy 1x week for 5 weeks. Call back number [phone] Wallace Cullens, RN'

## 2017-09-11 DIAGNOSIS — I10 Essential (primary) hypertension: Secondary | ICD-10-CM | POA: Diagnosis not present

## 2017-09-11 DIAGNOSIS — I509 Heart failure, unspecified: Secondary | ICD-10-CM | POA: Diagnosis not present

## 2017-09-11 DIAGNOSIS — I6300 Cerebral infarction due to thrombosis of unspecified precerebral artery: Secondary | ICD-10-CM | POA: Diagnosis not present

## 2017-09-11 MED ORDER — TAMSULOSIN HCL 0.4 MG PO CAPS: 0.4000 mg | ORAL_CAPSULE | Freq: Every day | ORAL | 3 refills | Status: DC

## 2017-09-14 ENCOUNTER — Other Ambulatory Visit: Payer: Self-pay | Admitting: Cardiovascular Disease

## 2017-09-14 DIAGNOSIS — I10 Essential (primary) hypertension: Secondary | ICD-10-CM | POA: Diagnosis not present

## 2017-09-14 DIAGNOSIS — I6300 Cerebral infarction due to thrombosis of unspecified precerebral artery: Secondary | ICD-10-CM | POA: Diagnosis not present

## 2017-09-14 DIAGNOSIS — I509 Heart failure, unspecified: Secondary | ICD-10-CM | POA: Diagnosis not present

## 2017-09-15 DIAGNOSIS — I6300 Cerebral infarction due to thrombosis of unspecified precerebral artery: Secondary | ICD-10-CM | POA: Diagnosis not present

## 2017-09-15 DIAGNOSIS — I10 Essential (primary) hypertension: Secondary | ICD-10-CM | POA: Diagnosis not present

## 2017-09-15 DIAGNOSIS — I509 Heart failure, unspecified: Secondary | ICD-10-CM | POA: Diagnosis not present

## 2017-09-15 NOTE — Telephone Encounter (Signed)
Placed verbal order with Stormy Card today. She will continue services.

## 2017-09-16 ENCOUNTER — Ambulatory Visit: Payer: Medicare HMO | Admitting: Family Medicine

## 2017-09-17 DIAGNOSIS — I10 Essential (primary) hypertension: Secondary | ICD-10-CM | POA: Diagnosis not present

## 2017-09-17 DIAGNOSIS — I509 Heart failure, unspecified: Secondary | ICD-10-CM | POA: Diagnosis not present

## 2017-09-17 DIAGNOSIS — I6300 Cerebral infarction due to thrombosis of unspecified precerebral artery: Secondary | ICD-10-CM | POA: Diagnosis not present

## 2017-09-24 ENCOUNTER — Telehealth: Payer: Self-pay | Admitting: *Deleted

## 2017-09-24 NOTE — Telephone Encounter (Signed)
Manual transmission received.  "AF" episodes are false, ECGs appear SR w/TWOS.  ECGs printed and placed in Dr. Langley Gauss folder for review.

## 2017-09-24 NOTE — Telephone Encounter (Signed)
LMOVM (DPR) requesting manual Carelink transmission for review.  [facility] phone number for questions/concerns.  Received alert for 3 "AF" episodes, available ECG appears SR w/TWOS.  Will review additional episode when full report received.

## 2017-09-28 DIAGNOSIS — I10 Essential (primary) hypertension: Secondary | ICD-10-CM | POA: Diagnosis not present

## 2017-09-28 DIAGNOSIS — I509 Heart failure, unspecified: Secondary | ICD-10-CM | POA: Diagnosis not present

## 2017-09-28 DIAGNOSIS — I6300 Cerebral infarction due to thrombosis of unspecified precerebral artery: Secondary | ICD-10-CM | POA: Diagnosis not present

## 2017-09-29 ENCOUNTER — Ambulatory Visit (INDEPENDENT_AMBULATORY_CARE_PROVIDER_SITE_OTHER): Payer: Medicare HMO | Admitting: *Deleted

## 2017-09-29 DIAGNOSIS — I509 Heart failure, unspecified: Secondary | ICD-10-CM | POA: Diagnosis not present

## 2017-09-29 DIAGNOSIS — I6300 Cerebral infarction due to thrombosis of unspecified precerebral artery: Secondary | ICD-10-CM

## 2017-09-29 DIAGNOSIS — I10 Essential (primary) hypertension: Secondary | ICD-10-CM | POA: Diagnosis not present

## 2017-09-30 DIAGNOSIS — I6300 Cerebral infarction due to thrombosis of unspecified precerebral artery: Secondary | ICD-10-CM | POA: Diagnosis not present

## 2017-09-30 DIAGNOSIS — I10 Essential (primary) hypertension: Secondary | ICD-10-CM | POA: Diagnosis not present

## 2017-09-30 DIAGNOSIS — I509 Heart failure, unspecified: Secondary | ICD-10-CM | POA: Diagnosis not present

## 2017-09-30 NOTE — Progress Notes (Signed)
Carelink Summary Report / Loop Recorder 

## 2017-10-01 DIAGNOSIS — I509 Heart failure, unspecified: Secondary | ICD-10-CM | POA: Diagnosis not present

## 2017-10-01 DIAGNOSIS — I6300 Cerebral infarction due to thrombosis of unspecified precerebral artery: Secondary | ICD-10-CM | POA: Diagnosis not present

## 2017-10-01 DIAGNOSIS — I10 Essential (primary) hypertension: Secondary | ICD-10-CM | POA: Diagnosis not present

## 2017-10-05 DIAGNOSIS — I10 Essential (primary) hypertension: Secondary | ICD-10-CM | POA: Diagnosis not present

## 2017-10-05 DIAGNOSIS — I6300 Cerebral infarction due to thrombosis of unspecified precerebral artery: Secondary | ICD-10-CM | POA: Diagnosis not present

## 2017-10-05 DIAGNOSIS — I509 Heart failure, unspecified: Secondary | ICD-10-CM | POA: Diagnosis not present

## 2017-10-05 LAB — CUP PACEART REMOTE DEVICE CHECK
Implantable Pulse Generator Implant Date: 20190130
MDC IDC SESS DTM: 20190301164210

## 2017-10-06 ENCOUNTER — Ambulatory Visit (INDEPENDENT_AMBULATORY_CARE_PROVIDER_SITE_OTHER): Payer: Medicare HMO | Admitting: Family Medicine

## 2017-10-06 ENCOUNTER — Telehealth: Payer: Self-pay

## 2017-10-06 ENCOUNTER — Other Ambulatory Visit: Payer: Self-pay

## 2017-10-06 ENCOUNTER — Encounter: Payer: Self-pay | Admitting: Family Medicine

## 2017-10-06 VITALS — BP 115/52 | HR 65 | Temp 97.4°F | Ht 64.0 in | Wt 153.0 lb

## 2017-10-06 DIAGNOSIS — R3914 Feeling of incomplete bladder emptying: Secondary | ICD-10-CM

## 2017-10-06 DIAGNOSIS — I6300 Cerebral infarction due to thrombosis of unspecified precerebral artery: Secondary | ICD-10-CM

## 2017-10-06 DIAGNOSIS — K5900 Constipation, unspecified: Secondary | ICD-10-CM | POA: Insufficient documentation

## 2017-10-06 DIAGNOSIS — K5904 Chronic idiopathic constipation: Secondary | ICD-10-CM | POA: Diagnosis not present

## 2017-10-06 DIAGNOSIS — R34 Anuria and oliguria: Secondary | ICD-10-CM | POA: Diagnosis not present

## 2017-10-06 DIAGNOSIS — N401 Enlarged prostate with lower urinary tract symptoms: Secondary | ICD-10-CM | POA: Diagnosis not present

## 2017-10-06 DIAGNOSIS — I10 Essential (primary) hypertension: Secondary | ICD-10-CM | POA: Diagnosis not present

## 2017-10-06 DIAGNOSIS — I509 Heart failure, unspecified: Secondary | ICD-10-CM | POA: Diagnosis not present

## 2017-10-06 DIAGNOSIS — N400 Enlarged prostate without lower urinary tract symptoms: Secondary | ICD-10-CM | POA: Insufficient documentation

## 2017-10-06 LAB — POCT URINALYSIS DIP (MANUAL ENTRY)
Bilirubin, UA: NEGATIVE
Blood, UA: NEGATIVE
GLUCOSE UA: NEGATIVE mg/dL
Ketones, POC UA: NEGATIVE mg/dL
Leukocytes, UA: NEGATIVE
NITRITE UA: NEGATIVE
Protein Ur, POC: NEGATIVE mg/dL
Spec Grav, UA: 1.025 (ref 1.010–1.025)
UROBILINOGEN UA: 0.2 U/dL
pH, UA: 6 (ref 5.0–8.0)

## 2017-10-06 MED ORDER — RANITIDINE HCL 150 MG PO TABS: 150.0000 mg | ORAL_TABLET | Freq: Every day | ORAL | 3 refills | Status: DC

## 2017-10-06 NOTE — Assessment & Plan Note (Signed)
States he is having trouble getting bowel movements.  Recommended as needed MiraLAX and increased hydration p.o.  Return if changes or blood in stool.

## 2017-10-06 NOTE — Assessment & Plan Note (Signed)
Already on Flomax.  Decreased urine output about 2 times per day.  Has baseline CKD 3, will recheck BMP today for intrinsic injury, will also check UA for signs of infection despite no dysuria or abdominal pain.  If all labs are normal, may need to increase Flomax or change to alternative medicine.  also encouraged increase in hydration.

## 2017-10-06 NOTE — Assessment & Plan Note (Signed)
Continue current therapies, neurology recently DC'd Plavix. Counseled patient that we are not able to predict his recovery from the stroke.

## 2017-10-06 NOTE — Progress Notes (Signed)
   CC: Follow-up stroke, decreased urination, change of bowel movements  HPI CVA - some days are good, some are bad. Forgot where he was going yesterday. Scared by this. Both parents with CVA and subsequent deficits. Using pill bottles. No falls. Getting therapies at home.  Overall, he feels he is doing well.  Has scheduled cardiology follow-up next week.  Status post recent loop recorder implantation.  BPH - having trouble getting urine out.  Urinates about 1-2 times per day. Thinks he is drinking 48 oz water per day. Occasional tea. Not sure if he is taking flomax but thinks he is.  No dysuria, no abdominal pain.  ?constipation -states he is having trouble getting bowel movements out.  Last BM 2 days ago. Normally goes every day. Drinking well.   ROS: Denies CP, SOB, abdominal pain, dysuria, falls, new weakness.  CC, SH/smoking status, and VS noted  Objective: BP (!) 115/52   Pulse 65   Temp (!) 97.4 F (36.3 C) (Oral)   Ht 5\' 4"  (1.626 m)   Wt 153 lb (69.4 kg)   SpO2 99%   BMI 26.26 kg/m  Gen: NAD, alert, cooperative, and pleasant.   HEENT: NCAT, EOMI, PERRL CV: RRR, no murmur Resp: CTAB, no wheezes, non-labored Abd: SNTND, BS present, no guarding or organomegaly Ext: No edema, warm Neuro: Alert and oriented, Speech clear, No gross deficits  Assessment and plan:  Stroke [facility])  Continue current therapies, neurology recently DC'd Plavix. Counseled patient that we are not able to predict his recovery from the stroke.    BPH (benign prostatic hyperplasia) Already on Flomax.  Decreased urine output about 2 times per day.  Has baseline CKD 3, will recheck BMP today for intrinsic injury, will also check UA for signs of infection despite no dysuria or abdominal pain.  If all labs are normal, may need to increase Flomax or change to alternative medicine.  also encouraged increase in hydration.  Constipation States he is having trouble getting bowel movements.  Recommended as  needed MiraLAX and increased hydration p.o.  Return if changes or blood in stool.  Med review: previously seeing Dr. [name] [facility] cards), but now seeing Dr. city after his stroke.  Recent implantation of loop recorder.  Is currently on Imdur, Coreg, lisinopril, niacin, Lipitor.  I have not been refilling Imdur, Coreg as these were from his previous cardiologist.  I asked the patient to discuss these medicines with Dr. Loletha Grayer, and I am happy to prescribe this if he needs them.  Did not want to duplicate medicines in a patient with underlying confusion.  Orders Placed This Encounter  Procedures  . Basic metabolic panel  . POCT urinalysis dipstick  . POCT urinalysis dipstick    Meds ordered this encounter  Medications  . ranitidine (ZANTAC) 150 MG tablet    Sig: Take 1 tablet (150 mg total) by mouth daily.    Dispense:  90 tablet    Refill:  3    Ralene Ok, MD, PGY2 10/06/2017 1:35 PM

## 2017-10-06 NOTE — Telephone Encounter (Signed)
Wells Guiles- Speech therapy with [facility] calling to let MD know she has d/c'd patient from speech therapy. Call with any questions [phone] Wallace Cullens, RN

## 2017-10-06 NOTE — Patient Instructions (Addendum)
It was a pleasure to see you today! Thank you for choosing [facility] for your primary care. Donald [name] was seen for decreased urine output, constipation, stroke follow up.   Our plans for today were:  Keep doing your therapies at home. I hope some of your memory will return.   Please put your medicines in a pill box like this. I am worried that you will accidentally take extra or forget if you get medicines from your bottles every day.     For your constipation, please use MiraLAX once daily as needed.  If you go 3 days without a bowel movement, you can use it twice daily.  This is over-the-counter.  For your decreased urination, I want to check several labs today.  If these are normal we may need to change your prostate medication.  I will call you about the labs.  Please ask your cardiologist next week about the medicines for your heart rate: Imdur, Coreg.  I can refill these if needed, but I do not want to duplicate what he is doing.  You should return to our clinic to see Dr. Lindell Noe in 3 months for follow up.   Best,  Dr. Lindell Noe

## 2017-10-07 DIAGNOSIS — I6300 Cerebral infarction due to thrombosis of unspecified precerebral artery: Secondary | ICD-10-CM | POA: Diagnosis not present

## 2017-10-07 DIAGNOSIS — I509 Heart failure, unspecified: Secondary | ICD-10-CM | POA: Diagnosis not present

## 2017-10-07 DIAGNOSIS — I10 Essential (primary) hypertension: Secondary | ICD-10-CM | POA: Diagnosis not present

## 2017-10-07 LAB — BASIC METABOLIC PANEL
BUN / CREAT RATIO: 17 (ref 10–24)
BUN: 23 mg/dL (ref 8–27)
CO2: 22 mmol/L (ref 20–29)
CREATININE: 1.39 mg/dL — AB (ref 0.76–1.27)
Calcium: 8.7 mg/dL (ref 8.6–10.2)
Chloride: 105 mmol/L (ref 96–106)
GFR calc non Af Amer: 54 mL/min/{1.73_m2} — ABNORMAL LOW (ref >59)
GFR, EST AFRICAN AMERICAN: 62 mL/min/{1.73_m2} (ref >59)
Glucose: 90 mg/dL (ref 65–99)
Potassium: 5.1 mmol/L (ref 3.5–5.2)
SODIUM: 142 mmol/L (ref 134–144)

## 2017-10-08 ENCOUNTER — Telehealth: Payer: Self-pay | Admitting: Family Medicine

## 2017-10-08 DIAGNOSIS — I10 Essential (primary) hypertension: Secondary | ICD-10-CM | POA: Diagnosis not present

## 2017-10-08 DIAGNOSIS — I6300 Cerebral infarction due to thrombosis of unspecified precerebral artery: Secondary | ICD-10-CM | POA: Diagnosis not present

## 2017-10-08 DIAGNOSIS — I509 Heart failure, unspecified: Secondary | ICD-10-CM | POA: Diagnosis not present

## 2017-10-08 MED ORDER — TAMSULOSIN HCL 0.4 MG PO CAPS: 0.8000 mg | ORAL_CAPSULE | Freq: Every day | ORAL | 3 refills | Status: DC

## 2017-10-08 NOTE — Telephone Encounter (Signed)
Called wife to discuss labs - normal. Increase flomax to 0.8mg . Can take two tablets of the 0.4mg  until they get the new rx.   Wife notes he is frequently holding his stomach. On my exam, he had normal bowel sounds no concerns with hernia. Reassured her. Come back if change in BMs or worsening abd pain.

## 2017-10-12 DIAGNOSIS — I6300 Cerebral infarction due to thrombosis of unspecified precerebral artery: Secondary | ICD-10-CM | POA: Diagnosis not present

## 2017-10-12 DIAGNOSIS — I509 Heart failure, unspecified: Secondary | ICD-10-CM | POA: Diagnosis not present

## 2017-10-12 DIAGNOSIS — I10 Essential (primary) hypertension: Secondary | ICD-10-CM | POA: Diagnosis not present

## 2017-10-14 DIAGNOSIS — I10 Essential (primary) hypertension: Secondary | ICD-10-CM | POA: Diagnosis not present

## 2017-10-14 DIAGNOSIS — I6300 Cerebral infarction due to thrombosis of unspecified precerebral artery: Secondary | ICD-10-CM | POA: Diagnosis not present

## 2017-10-14 DIAGNOSIS — I509 Heart failure, unspecified: Secondary | ICD-10-CM | POA: Diagnosis not present

## 2017-10-15 ENCOUNTER — Other Ambulatory Visit: Payer: Self-pay | Admitting: Family Medicine

## 2017-10-15 DIAGNOSIS — I509 Heart failure, unspecified: Secondary | ICD-10-CM | POA: Diagnosis not present

## 2017-10-15 DIAGNOSIS — I6300 Cerebral infarction due to thrombosis of unspecified precerebral artery: Secondary | ICD-10-CM | POA: Diagnosis not present

## 2017-10-15 DIAGNOSIS — I10 Essential (primary) hypertension: Secondary | ICD-10-CM | POA: Diagnosis not present

## 2017-10-16 ENCOUNTER — Encounter: Payer: Self-pay | Admitting: Cardiovascular Disease

## 2017-10-16 ENCOUNTER — Ambulatory Visit (INDEPENDENT_AMBULATORY_CARE_PROVIDER_SITE_OTHER): Payer: Medicare HMO | Admitting: Cardiovascular Disease

## 2017-10-16 VITALS — BP 84/51 | HR 66 | Ht 63.0 in | Wt 155.6 lb

## 2017-10-16 DIAGNOSIS — Z8673 Personal history of transient ischemic attack (TIA), and cerebral infarction without residual deficits: Secondary | ICD-10-CM

## 2017-10-16 DIAGNOSIS — I10 Essential (primary) hypertension: Secondary | ICD-10-CM | POA: Diagnosis not present

## 2017-10-16 DIAGNOSIS — I5022 Chronic systolic (congestive) heart failure: Secondary | ICD-10-CM

## 2017-10-16 DIAGNOSIS — I2510 Atherosclerotic heart disease of native coronary artery without angina pectoris: Secondary | ICD-10-CM

## 2017-10-16 DIAGNOSIS — Z95818 Presence of other cardiac implants and grafts: Secondary | ICD-10-CM | POA: Diagnosis not present

## 2017-10-16 DIAGNOSIS — I509 Heart failure, unspecified: Secondary | ICD-10-CM | POA: Diagnosis not present

## 2017-10-16 DIAGNOSIS — I6300 Cerebral infarction due to thrombosis of unspecified precerebral artery: Secondary | ICD-10-CM | POA: Diagnosis not present

## 2017-10-16 MED ORDER — LISINOPRIL 5 MG PO TABS: 5.0000 mg | ORAL_TABLET | Freq: Every day | ORAL | 3 refills | Status: DC

## 2017-10-16 NOTE — Patient Instructions (Signed)
Dr Sallyanne Kuster has recommended making the following medication changes: 1. STOP Isosorbide 2. DECREASE Lisinopril to 5 mg daily  Your physician has requested that you have a cardiac MRI. Cardiac MRI uses a computer to create images of your heart as its beating, producing both still and moving pictures of your heart and major blood vessels. For further information please visit [email or web address] Please follow the instruction sheet given to you today for more information. This will be performed at [facility].  Dr Sallyanne Kuster recommends that you schedule a follow-up appointment in 3 months.  If you need a refill on your cardiac medications before your next appointment, please call your pharmacy.
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
## 2017-10-19 DIAGNOSIS — I10 Essential (primary) hypertension: Secondary | ICD-10-CM | POA: Diagnosis not present

## 2017-10-19 DIAGNOSIS — I6300 Cerebral infarction due to thrombosis of unspecified precerebral artery: Secondary | ICD-10-CM | POA: Diagnosis not present

## 2017-10-19 DIAGNOSIS — I509 Heart failure, unspecified: Secondary | ICD-10-CM | POA: Diagnosis not present

## 2017-10-20 DIAGNOSIS — I6300 Cerebral infarction due to thrombosis of unspecified precerebral artery: Secondary | ICD-10-CM | POA: Diagnosis not present

## 2017-10-20 DIAGNOSIS — I509 Heart failure, unspecified: Secondary | ICD-10-CM | POA: Diagnosis not present

## 2017-10-20 DIAGNOSIS — I10 Essential (primary) hypertension: Secondary | ICD-10-CM | POA: Diagnosis not present

## 2017-10-21 DIAGNOSIS — I519 Heart disease, unspecified: Secondary | ICD-10-CM | POA: Diagnosis not present

## 2017-10-21 DIAGNOSIS — I2510 Atherosclerotic heart disease of native coronary artery without angina pectoris: Secondary | ICD-10-CM | POA: Diagnosis not present

## 2017-10-21 LAB — BASIC METABOLIC PANEL
BUN / CREAT RATIO: 13 (ref 10–24)
BUN: 19 mg/dL (ref 8–27)
CALCIUM: 9.6 mg/dL (ref 8.6–10.2)
CO2: 24 mmol/L (ref 20–29)
Chloride: 104 mmol/L (ref 96–106)
Creatinine, Ser: 1.44 mg/dL — ABNORMAL HIGH (ref 0.76–1.27)
GFR calc non Af Amer: 52 mL/min/{1.73_m2} — ABNORMAL LOW (ref >59)
GFR, EST AFRICAN AMERICAN: 60 mL/min/{1.73_m2} (ref >59)
Glucose: 94 mg/dL (ref 65–99)
POTASSIUM: 5.6 mmol/L — AB (ref 3.5–5.2)
Sodium: 141 mmol/L (ref 134–144)

## 2017-10-23 DIAGNOSIS — I509 Heart failure, unspecified: Secondary | ICD-10-CM | POA: Diagnosis not present

## 2017-10-23 DIAGNOSIS — I6300 Cerebral infarction due to thrombosis of unspecified precerebral artery: Secondary | ICD-10-CM | POA: Diagnosis not present

## 2017-10-23 DIAGNOSIS — I10 Essential (primary) hypertension: Secondary | ICD-10-CM | POA: Diagnosis not present

## 2017-10-26 DIAGNOSIS — I6300 Cerebral infarction due to thrombosis of unspecified precerebral artery: Secondary | ICD-10-CM | POA: Diagnosis not present

## 2017-10-26 DIAGNOSIS — I10 Essential (primary) hypertension: Secondary | ICD-10-CM | POA: Diagnosis not present

## 2017-10-26 DIAGNOSIS — I509 Heart failure, unspecified: Secondary | ICD-10-CM | POA: Diagnosis not present

## 2017-10-28 DIAGNOSIS — I509 Heart failure, unspecified: Secondary | ICD-10-CM | POA: Diagnosis not present

## 2017-10-28 DIAGNOSIS — I6300 Cerebral infarction due to thrombosis of unspecified precerebral artery: Secondary | ICD-10-CM | POA: Diagnosis not present

## 2017-10-28 DIAGNOSIS — I10 Essential (primary) hypertension: Secondary | ICD-10-CM | POA: Diagnosis not present

## 2017-10-29 DIAGNOSIS — I509 Heart failure, unspecified: Secondary | ICD-10-CM | POA: Diagnosis not present

## 2017-10-29 DIAGNOSIS — I6300 Cerebral infarction due to thrombosis of unspecified precerebral artery: Secondary | ICD-10-CM | POA: Diagnosis not present

## 2017-10-29 DIAGNOSIS — I10 Essential (primary) hypertension: Secondary | ICD-10-CM | POA: Diagnosis not present

## 2017-10-30 DIAGNOSIS — I6300 Cerebral infarction due to thrombosis of unspecified precerebral artery: Secondary | ICD-10-CM | POA: Diagnosis not present

## 2017-10-30 DIAGNOSIS — I509 Heart failure, unspecified: Secondary | ICD-10-CM | POA: Diagnosis not present

## 2017-10-30 DIAGNOSIS — I10 Essential (primary) hypertension: Secondary | ICD-10-CM | POA: Diagnosis not present

## 2017-11-02 ENCOUNTER — Encounter: Payer: Self-pay | Admitting: Cardiovascular Disease

## 2017-11-02 ENCOUNTER — Ambulatory Visit (INDEPENDENT_AMBULATORY_CARE_PROVIDER_SITE_OTHER): Payer: Medicare HMO | Admitting: *Deleted

## 2017-11-02 DIAGNOSIS — I6300 Cerebral infarction due to thrombosis of unspecified precerebral artery: Secondary | ICD-10-CM

## 2017-11-03 NOTE — Progress Notes (Signed)
Carelink Summary Report / Loop Recorder 

## 2017-11-04 LAB — CUP PACEART REMOTE DEVICE CHECK
Implantable Pulse Generator Implant Date: 20190130
MDC IDC SESS DTM: 20190403174020

## 2017-11-05 ENCOUNTER — Other Ambulatory Visit: Payer: Self-pay | Admitting: Family Medicine

## 2017-11-11 ENCOUNTER — Ambulatory Visit (HOSPITAL_COMMUNITY)
Admission: RE | Admit: 2017-11-11 | Discharge: 2017-11-11 | Disposition: A | Discharge status: Home / Self Care | Payer: Medicare HMO | Source: Ambulatory Visit | Attending: Cardiovascular Disease | Admitting: Cardiovascular Disease

## 2017-11-11 DIAGNOSIS — I2510 Atherosclerotic heart disease of native coronary artery without angina pectoris: Secondary | ICD-10-CM | POA: Diagnosis not present

## 2017-11-11 DIAGNOSIS — I5022 Chronic systolic (congestive) heart failure: Secondary | ICD-10-CM | POA: Diagnosis not present

## 2017-11-11 DIAGNOSIS — R29898 Other symptoms and signs involving the musculoskeletal system: Secondary | ICD-10-CM | POA: Diagnosis not present

## 2017-11-11 DIAGNOSIS — I313 Pericardial effusion (noninflammatory): Secondary | ICD-10-CM | POA: Insufficient documentation

## 2017-11-11 MED ORDER — GADOBENATE DIMEGLUMINE 529 MG/ML IV SOLN
25.0000 mL | Freq: Once | INTRAVENOUS | Status: AC
  Administered 2017-11-11: 25 mL via INTRAVENOUS

## 2017-11-17 ENCOUNTER — Other Ambulatory Visit: Payer: Self-pay | Admitting: Cardiovascular Disease

## 2017-11-17 MED ORDER — LISINOPRIL 2.5 MG PO TABS: 5.0000 mg | ORAL_TABLET | Freq: Every day | ORAL | 3 refills | Status: DC

## 2017-11-24 LAB — CUP PACEART REMOTE DEVICE CHECK
Date Time Interrogation Session: 20190506181057
Implantable Pulse Generator Implant Date: 20190130

## 2017-11-29 ENCOUNTER — Emergency Department (HOSPITAL_COMMUNITY)
Admission: EM | Admit: 2017-11-29 | Discharge: 2017-11-29 | Disposition: A | Discharge status: Home / Self Care | Payer: Medicare HMO | Attending: Emergency Medicine | Admitting: Emergency Medicine

## 2017-11-29 ENCOUNTER — Encounter (HOSPITAL_COMMUNITY): Payer: Self-pay | Admitting: Emergency Medicine

## 2017-11-29 DIAGNOSIS — I2510 Atherosclerotic heart disease of native coronary artery without angina pectoris: Secondary | ICD-10-CM | POA: Diagnosis not present

## 2017-11-29 DIAGNOSIS — Z87891 Personal history of nicotine dependence: Secondary | ICD-10-CM | POA: Insufficient documentation

## 2017-11-29 DIAGNOSIS — I110 Hypertensive heart disease with heart failure: Secondary | ICD-10-CM | POA: Diagnosis not present

## 2017-11-29 DIAGNOSIS — E039 Hypothyroidism, unspecified: Secondary | ICD-10-CM | POA: Insufficient documentation

## 2017-11-29 DIAGNOSIS — Z0130 Encounter for examination of blood pressure without abnormal findings: Secondary | ICD-10-CM | POA: Diagnosis not present

## 2017-11-29 DIAGNOSIS — Z79899 Other long term (current) drug therapy: Secondary | ICD-10-CM | POA: Insufficient documentation

## 2017-11-29 DIAGNOSIS — I509 Heart failure, unspecified: Secondary | ICD-10-CM | POA: Insufficient documentation

## 2017-11-29 DIAGNOSIS — Z7982 Long term (current) use of aspirin: Secondary | ICD-10-CM | POA: Insufficient documentation

## 2017-11-29 NOTE — Discharge Instructions (Addendum)
Please obtained a new blood pressure machine.  Follow-up with family doctor as needed.

## 2017-11-29 NOTE — ED Notes (Signed)
Family at bedside. 
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
## 2017-11-29 NOTE — ED Notes (Signed)
Patient is alert and orientedx4.  Patient was explained discharge instructions and they understood them with no questions.   

## 2017-11-29 NOTE — ED Triage Notes (Signed)
Patient complains of hypertension, states on his new home automatic blood pressure cuff reported 176/90. BP in 120/60. Denies any symptoms. Alert and oriented and in on distress at this time.

## 2017-12-05 ENCOUNTER — Other Ambulatory Visit: Payer: Self-pay | Admitting: Family Medicine

## 2017-12-07 ENCOUNTER — Ambulatory Visit (INDEPENDENT_AMBULATORY_CARE_PROVIDER_SITE_OTHER): Payer: Medicare HMO | Admitting: Family Medicine

## 2017-12-07 ENCOUNTER — Other Ambulatory Visit: Payer: Self-pay

## 2017-12-07 ENCOUNTER — Emergency Department (HOSPITAL_COMMUNITY): Payer: Medicare HMO

## 2017-12-07 ENCOUNTER — Emergency Department (HOSPITAL_COMMUNITY)
Admission: EM | Admit: 2017-12-07 | Discharge: 2017-12-07 | Disposition: A | Discharge status: Home / Self Care | Payer: Medicare HMO | Attending: Emergency Medicine | Admitting: Emergency Medicine

## 2017-12-07 ENCOUNTER — Ambulatory Visit (HOSPITAL_COMMUNITY)
Admission: RE | Admit: 2017-12-07 | Discharge: 2017-12-07 | Disposition: A | Discharge status: Home / Self Care | Payer: Medicare HMO | Source: Ambulatory Visit | Attending: Family Medicine | Admitting: Family Medicine

## 2017-12-07 ENCOUNTER — Encounter: Payer: Self-pay | Admitting: Family Medicine

## 2017-12-07 ENCOUNTER — Encounter (HOSPITAL_COMMUNITY): Payer: Self-pay | Admitting: Emergency Medicine

## 2017-12-07 ENCOUNTER — Ambulatory Visit (INDEPENDENT_AMBULATORY_CARE_PROVIDER_SITE_OTHER): Payer: Medicare HMO | Admitting: *Deleted

## 2017-12-07 VITALS — BP 160/82 | HR 75 | Temp 98.7°F | Ht 63.0 in

## 2017-12-07 DIAGNOSIS — I6300 Cerebral infarction due to thrombosis of unspecified precerebral artery: Secondary | ICD-10-CM | POA: Diagnosis not present

## 2017-12-07 DIAGNOSIS — R4182 Altered mental status, unspecified: Secondary | ICD-10-CM | POA: Diagnosis not present

## 2017-12-07 DIAGNOSIS — R1032 Left lower quadrant pain: Secondary | ICD-10-CM

## 2017-12-07 DIAGNOSIS — R079 Chest pain, unspecified: Secondary | ICD-10-CM | POA: Insufficient documentation

## 2017-12-07 DIAGNOSIS — Z5321 Procedure and treatment not carried out due to patient leaving prior to being seen by health care provider: Secondary | ICD-10-CM | POA: Diagnosis not present

## 2017-12-07 LAB — BASIC METABOLIC PANEL
ANION GAP: 12 (ref 5–15)
BUN: 19 mg/dL (ref 6–20)
CHLORIDE: 105 mmol/L (ref 101–111)
CO2: 23 mmol/L (ref 22–32)
CREATININE: 1.48 mg/dL — AB (ref 0.61–1.24)
Calcium: 9.7 mg/dL (ref 8.9–10.3)
GFR calc non Af Amer: 49 mL/min — ABNORMAL LOW (ref >60)
GFR, EST AFRICAN AMERICAN: 57 mL/min — AB (ref >60)
Glucose, Bld: 108 mg/dL — ABNORMAL HIGH (ref 65–99)
Potassium: 4.7 mmol/L (ref 3.5–5.1)
SODIUM: 140 mmol/L (ref 135–145)

## 2017-12-07 LAB — CBC
HCT: 40.7 % (ref 39.0–52.0)
HEMOGLOBIN: 13.2 g/dL (ref 13.0–17.0)
MCH: 28 pg (ref 26.0–34.0)
MCHC: 32.4 g/dL (ref 30.0–36.0)
MCV: 86.4 fL (ref 78.0–100.0)
PLATELETS: 278 10*3/uL (ref 150–400)
RBC: 4.71 MIL/uL (ref 4.22–5.81)
RDW: 13.4 % (ref 11.5–15.5)
WBC: 8.3 10*3/uL (ref 4.0–10.5)

## 2017-12-07 LAB — I-STAT TROPONIN, ED: TROPONIN I, POC: 0 ng/mL (ref 0.00–0.08)

## 2017-12-07 NOTE — Progress Notes (Signed)
CC: AMS, left sided weakness, CP   HPI  AMS - new over the past 3 days.  History of stroke with some cognitive impairment, however his wife states that over the weekend he has had many episodes where he does not respond to any verbal commands and stares off.  This is entirely new and much worse mental status from previous.  She feels this all began in the middle of the week last week when they noticed his blood pressure was elevated on home BP cuff and went to the emergency room.  BP was actually borderline low in the emergency room. In ed on 6/2 wife thinks he was a little spacey with some abd pain. Made appt for today 2/2 abd pain, but then started w the catatonic episodes.  No new over-the-counter medicines or ingestions that she knows of. Still taking sertraline. Last week CP and abd pain complaints on and off, seems to be eating and drinking normally, but disoriented all weekend.  She also notes that she feels shuffing gait is back, PT said it had resolved,   Although she feels this has been present if possibly slightly improving over time.  Chest pain: Patient states it squeezing, constant, unable to specify whether worse with exertion.  Wife says he has not been consistently complaining of this every day over the weekend.  Additionally, Sent loop transmission this am,   And she expected a call if something abnormal was seen.  She did not get a call.  During interview, patient becomes suddenly tearful. wife states he is worried about going back to rehab, has been mentioning several times over the weekend he is worried that he is going to be locked up somewhere.  Patient denies urinary symptoms, does have a history of urinary incontinence prior to the stroke per wife's report.  ROS: Endorses chest pain, abdominal pain, denies shortness of breath, denies dysuria, denies changes in bowel movements.  CC, SH/smoking status, and VS noted  Objective: BP (!) 160/82   Pulse 75   Temp 98.7 F (37.1  C) (Oral)   Ht 5\' 3"  (1.6 m)   SpO2 98%   BMI 26.93 kg/m  Gen: NAD,  intermittently alert, at times stares off for 10 to 20 seconds without response. HEENT: NCAT, EOMI, PERRL CV: RRR, no murmur Resp: CTAB, no wheezes, non-labored Abd: Soft, nondistended BS present, no guarding or organomegaly.  Tender to deep palpation in left lower quadrant. Ext: No edema, warm Neuro: Intermittently alert, able to follow commands after long pauses, decreased grip strength in left hand and decreased left lower extremity strength 4 out of 5.  Unchanged 4 out of 5 right upper and lower extremity strength.  Assessment and plan:  Altered mental status: Patient was wheeled to the emergency room by [facility] for further work-up.  At this point, I am concerned for recurrent stroke versus possible normal pressure hydrocephalus.  Although patient is likely out of the window for any stroke intervention, would certainly check CT head for possible hemorrhagic conversion given altered mental status.  MRI may also be helpful to evaluate for NPH.  Would recommend neurology consult, have also let the inpatient family medicine service know that the patient is going to the emergency room for possible admission pending further work-up.  Would also consider tox labs as patient has cognitive impairment and is not consistently supervised.  Although ingestion is less likely.   Chest pain: Extensive cardiac work-up in the past, history of CABG, recent cardiac  MRI in May with significant scarringand  poor CABG candidate.  Follows with Dr. Helene Shoe from [facility] cards.  EKG today unchanged from previous in the office.  Would consider delta troponin and possible cardiac consultation inpatient.  Abdominal pain: Unclear etiology, would consider CT abdomen and pelvis for possible diverticulitis versus mesenteric ischemia as patient is a vasculopath, although this is less likely with subacute pain and nonsurgical abdomen on exam.  Orders Placed This  Encounter  Procedures  . EKG 12-Lead    No orders of the defined types were placed in this encounter.   Ralene Ok, MD, PGY2 12/07/2017 11:57 AM

## 2017-12-07 NOTE — Patient Instructions (Signed)
Patient taken directly to ED

## 2017-12-07 NOTE — ED Triage Notes (Signed)
Pt c/o chest pain and abd pain=started 2 weeks ago-- saw reg doctor this am, wife states that pt is disoriented x 2 weeks, shuffling gait- hx stroke in December 2019, 2015

## 2017-12-07 NOTE — ED Notes (Addendum)
Pt's family member to desk, pt wants to leave. RN informed pt he is currently the longest wait. Pt decided to leave. Pt currently in stable condition.

## 2017-12-07 NOTE — Progress Notes (Signed)
Carelink Summary Report / Loop Recorder 

## 2017-12-08 ENCOUNTER — Telehealth: Payer: Self-pay | Admitting: Neurology

## 2017-12-08 ENCOUNTER — Telehealth (HOSPITAL_COMMUNITY): Payer: Self-pay | Admitting: Family Medicine

## 2017-12-08 ENCOUNTER — Encounter: Payer: Self-pay | Admitting: Family Medicine

## 2017-12-08 NOTE — Telephone Encounter (Signed)
Pts wife requesting a call to discuss the office visit with the pts PCP. Stating Dr. Lindell Noe "is unclear if the pt had another stroke or if something else has happened". Requesting a call to discuss any MRI finding or a new MRI along with maybe being seen sooner. Please call to advise

## 2017-12-08 NOTE — Telephone Encounter (Signed)
Rn call patients wife about PCP concerns. PT was seen by PCP yesterday for office visit. The wife stated the PCP saw the [place] had in 05/2017, and saw something bedside a stroke on the scan. Rn stated to wife her husband was seen by Dr. Joya Gaskins in 07/2017,and scans were discuss at last office visit. The wife stated her husband sometimes just stares off sometimes, and he has a decrease attention span. Rn stated there has not been a phone call from the PCP office. Pt has an appt in 02/2018 with Janett Billow stroke NP.The wife does not know if patient had another stroke. The wife did not give me anymore details of pt was having any new sympts bedside decrease attention,and staring off. Rn advised her to [place] have them contact our office to speak with Dr. Leonie Man about their concerns. The wife stated the RN , CMA ,or MD can call. Rn advised wife Dr. Leonie Man is out of the office till 12/28/2017. He is working in the hospital .

## 2017-12-08 NOTE — Telephone Encounter (Signed)
Called [name], patient's wife to discuss further work-up.  Noted through chart review that patient and family left from the emergency room last night after waiting many hours. Also reviewed note from phone call to Dr. Clydene Fake office. I would like to continue to work-up his altered mental status episodes.  Differential includes normal pressure hydrocephalus, recurrent CVA, seizures from focus of prior CVA.  Discussed with [name] the options of direct admission versus outpatient MRI, precepted with Dr. Andria Frames.  [name] elects for outpatient MRI with strict return precautions to the emergency room for worsening altered mental status, falls.  Will go ahead and order MRI and route to [facility] to schedule.

## 2017-12-08 NOTE — Telephone Encounter (Signed)
  Also, patient does have loop recorder.  Of note, he has had a cardiac MRI about 1 month ago with loop recorder in place without issue.  Called Dr. Jeralyn Ruths with neuroradiology who reports that they have a protocol for conducting MRIs with implantable devices.  He said to go ahead and order the MRI and the techs will assess via their protocol.

## 2017-12-10 NOTE — Telephone Encounter (Signed)
I will send a message to Dr. Sallyanne Kuster, his cardiologist. I believe they sent a loop recorder transmission last week, so I think he will not have an objection to the MRI.

## 2017-12-10 NOTE — Telephone Encounter (Signed)
Yes, please, go ahead. These newer device do not erase anyway ( I am told by the rep). MCr

## 2017-12-10 NOTE — Telephone Encounter (Signed)
LVM for pt to call office back to inform them of pt MRI appointment.  It is scheduled for Wednesday 6/19 @ 2:00pm @[facility] first floor radiology.  Pt is to arrive at 1:30. Scheduler said that cardiology should be notified so they can get their information prior to appointment. Due to everything being erased from MRI. Routing to PCP as an Pharmacist, hospital. Katharina Caper, [name], [place]

## 2017-12-10 NOTE — Telephone Encounter (Signed)
White team, ok per cardiology to go ahead with MRI. Could you let the MRI scheduler know this? Thanks for checking!

## 2017-12-11 ENCOUNTER — Encounter: Payer: Self-pay | Admitting: Family Medicine

## 2017-12-11 ENCOUNTER — Other Ambulatory Visit: Payer: Self-pay | Admitting: Family Medicine

## 2017-12-11 NOTE — Telephone Encounter (Signed)
Patient wife called and needs a 90 day supply of Tamsulosin sent to [facility] and a 16 day supply sent to [facility]. Has sent PCP a mychart message.  Call back is [phone]  [name], RN [facility] [place])

## 2017-12-12 MED ORDER — TAMSULOSIN HCL 0.4 MG PO CAPS
0.4000 mg | ORAL_CAPSULE | Freq: Every day | ORAL | 0 refills | Status: DC
Start: 2017-12-12 — End: 2018-01-15

## 2017-12-14 ENCOUNTER — Other Ambulatory Visit: Payer: Self-pay

## 2017-12-14 ENCOUNTER — Emergency Department (HOSPITAL_COMMUNITY)
Admission: EM | Admit: 2017-12-14 | Discharge: 2017-12-14 | Disposition: A | Discharge status: Home / Self Care | Payer: Medicare HMO | Attending: Emergency Medicine | Admitting: Emergency Medicine

## 2017-12-14 ENCOUNTER — Encounter: Payer: Self-pay | Admitting: Psychology

## 2017-12-14 ENCOUNTER — Ambulatory Visit (INDEPENDENT_AMBULATORY_CARE_PROVIDER_SITE_OTHER): Payer: Medicare HMO | Admitting: Psychology

## 2017-12-14 ENCOUNTER — Encounter (HOSPITAL_COMMUNITY): Payer: Self-pay

## 2017-12-14 DIAGNOSIS — I509 Heart failure, unspecified: Secondary | ICD-10-CM | POA: Insufficient documentation

## 2017-12-14 DIAGNOSIS — Z87891 Personal history of nicotine dependence: Secondary | ICD-10-CM | POA: Insufficient documentation

## 2017-12-14 DIAGNOSIS — F329 Major depressive disorder, single episode, unspecified: Secondary | ICD-10-CM | POA: Diagnosis not present

## 2017-12-14 DIAGNOSIS — F32A Depression, unspecified: Secondary | ICD-10-CM | POA: Insufficient documentation

## 2017-12-14 DIAGNOSIS — R45851 Suicidal ideations: Secondary | ICD-10-CM | POA: Insufficient documentation

## 2017-12-14 DIAGNOSIS — Z79899 Other long term (current) drug therapy: Secondary | ICD-10-CM | POA: Diagnosis not present

## 2017-12-14 DIAGNOSIS — Z7982 Long term (current) use of aspirin: Secondary | ICD-10-CM | POA: Diagnosis not present

## 2017-12-14 DIAGNOSIS — R4589 Other symptoms and signs involving emotional state: Secondary | ICD-10-CM | POA: Diagnosis present

## 2017-12-14 DIAGNOSIS — I2510 Atherosclerotic heart disease of native coronary artery without angina pectoris: Secondary | ICD-10-CM | POA: Insufficient documentation

## 2017-12-14 DIAGNOSIS — Z8673 Personal history of transient ischemic attack (TIA), and cerebral infarction without residual deficits: Secondary | ICD-10-CM | POA: Insufficient documentation

## 2017-12-14 LAB — COMPREHENSIVE METABOLIC PANEL
ALBUMIN: 3.7 g/dL (ref 3.5–5.0)
ALT: 18 U/L (ref 17–63)
ANION GAP: 10 (ref 5–15)
AST: 17 U/L (ref 15–41)
Alkaline Phosphatase: 20 U/L — ABNORMAL LOW (ref 38–126)
BILIRUBIN TOTAL: 0.7 mg/dL (ref 0.3–1.2)
BUN: 32 mg/dL — AB (ref 6–20)
CALCIUM: 9.5 mg/dL (ref 8.9–10.3)
CO2: 21 mmol/L — AB (ref 22–32)
CREATININE: 1.55 mg/dL — AB (ref 0.61–1.24)
Chloride: 108 mmol/L (ref 101–111)
GFR calc Af Amer: 54 mL/min — ABNORMAL LOW (ref >60)
GFR calc non Af Amer: 46 mL/min — ABNORMAL LOW (ref >60)
GLUCOSE: 92 mg/dL (ref 65–99)
Potassium: 4.8 mmol/L (ref 3.5–5.1)
Sodium: 139 mmol/L (ref 135–145)
Total Protein: 7.3 g/dL (ref 6.5–8.1)

## 2017-12-14 LAB — CBC WITH DIFFERENTIAL/PLATELET
Abs Immature Granulocytes: 0 10*3/uL (ref 0.0–0.1)
BASOS ABS: 0.1 10*3/uL (ref 0.0–0.1)
BASOS PCT: 1 %
EOS PCT: 2 %
Eosinophils Absolute: 0.1 10*3/uL (ref 0.0–0.7)
HCT: 39.7 % (ref 39.0–52.0)
Hemoglobin: 12.4 g/dL — ABNORMAL LOW (ref 13.0–17.0)
Immature Granulocytes: 1 %
Lymphocytes Relative: 23 %
Lymphs Abs: 2 10*3/uL (ref 0.7–4.0)
MCH: 27.7 pg (ref 26.0–34.0)
MCHC: 31.2 g/dL (ref 30.0–36.0)
MCV: 88.8 fL (ref 78.0–100.0)
MONO ABS: 0.7 10*3/uL (ref 0.1–1.0)
Monocytes Relative: 8 %
Neutro Abs: 5.9 10*3/uL (ref 1.7–7.7)
Neutrophils Relative %: 67 %
PLATELETS: 249 10*3/uL (ref 150–400)
RBC: 4.47 MIL/uL (ref 4.22–5.81)
RDW: 13.5 % (ref 11.5–15.5)
WBC: 8.8 10*3/uL (ref 4.0–10.5)

## 2017-12-14 LAB — RAPID URINE DRUG SCREEN, HOSP PERFORMED
Amphetamines: NOT DETECTED
Benzodiazepines: NOT DETECTED
COCAINE: NOT DETECTED
Opiates: NOT DETECTED
TETRAHYDROCANNABINOL: NOT DETECTED

## 2017-12-14 LAB — ETHANOL: Alcohol, Ethyl (B): <10 mg/dL (ref <10)

## 2017-12-14 LAB — ACETAMINOPHEN LEVEL: Acetaminophen (Tylenol), Serum: <10 ug/mL — ABNORMAL LOW (ref 10–30)

## 2017-12-14 LAB — SALICYLATE LEVEL

## 2017-12-14 MED ORDER — ASPIRIN EC 325 MG PO TBEC
325.0000 mg | DELAYED_RELEASE_TABLET | Freq: Every day | ORAL | Status: DC
  Filled 2017-12-14: qty 1

## 2017-12-14 MED ORDER — GABAPENTIN 100 MG PO CAPS
100.0000 mg | ORAL_CAPSULE | Freq: Two times a day (BID) | ORAL | Status: DC
  Filled 2017-12-14: qty 1

## 2017-12-14 MED ORDER — ONDANSETRON HCL 4 MG PO TABS: 4.0000 mg | ORAL_TABLET | Freq: Three times a day (TID) | ORAL | Status: DC | PRN

## 2017-12-14 MED ORDER — ATORVASTATIN CALCIUM 40 MG PO TABS
40.0000 mg | ORAL_TABLET | Freq: Every day | ORAL | Status: DC
  Filled 2017-12-14: qty 2

## 2017-12-14 MED ORDER — ACETAMINOPHEN 325 MG PO TABS: 650.0000 mg | ORAL_TABLET | ORAL | Status: DC | PRN

## 2017-12-14 MED ORDER — ZIPRASIDONE MESYLATE 20 MG IM SOLR: 20.0000 mg | INTRAMUSCULAR | Status: DC | PRN

## 2017-12-14 MED ORDER — OLANZAPINE 5 MG PO TBDP
5.0000 mg | ORAL_TABLET | Freq: Three times a day (TID) | ORAL | Status: DC | PRN
  Filled 2017-12-14: qty 1

## 2017-12-14 MED ORDER — FAMOTIDINE 20 MG PO TABS
10.0000 mg | ORAL_TABLET | Freq: Every day | ORAL | Status: DC
  Filled 2017-12-14: qty 1

## 2017-12-14 MED ORDER — LORAZEPAM 1 MG PO TABS: 1.0000 mg | ORAL_TABLET | ORAL | Status: DC | PRN

## 2017-12-14 MED ORDER — ZOLPIDEM TARTRATE 5 MG PO TABS: 5.0000 mg | ORAL_TABLET | Freq: Every evening | ORAL | Status: DC | PRN

## 2017-12-14 MED ORDER — VITAMIN B-12 1000 MCG PO TABS: 1000.0000 ug | ORAL_TABLET | Freq: Every day | ORAL | Status: DC

## 2017-12-14 MED ORDER — LISINOPRIL 2.5 MG PO TABS
5.0000 mg | ORAL_TABLET | Freq: Every day | ORAL | Status: DC
  Filled 2017-12-14: qty 2

## 2017-12-14 MED ORDER — CARVEDILOL 3.125 MG PO TABS
3.1250 mg | ORAL_TABLET | Freq: Two times a day (BID) | ORAL | Status: DC
  Filled 2017-12-14: qty 1

## 2017-12-14 MED ORDER — SERTRALINE HCL 50 MG PO TABS: 50.0000 mg | ORAL_TABLET | Freq: Every day | ORAL | Status: DC

## 2017-12-14 MED ORDER — TAMSULOSIN HCL 0.4 MG PO CAPS
0.4000 mg | ORAL_CAPSULE | Freq: Every day | ORAL | Status: DC
  Filled 2017-12-14: qty 1

## 2017-12-14 MED ORDER — NIACIN 250 MG PO TABS: 250.0000 mg | ORAL_TABLET | Freq: Every day | ORAL | Status: DC
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
## 2017-12-14 NOTE — Progress Notes (Signed)
Patient seen for an initial integrated care clinic visit today.  Patient seen by [facility] Consultant Grace Isaac.  Larene Beach is unable to access Epic this morning and so I (her supervisor) am writing the note.    Presenting Issue:  Depression with suicidal ideation.  Did not elicit significant history beyond this.    Suicide Assessment  Plan: - How specific is the plan: Specific.  Overdose on pills.   - How lethal are the means: Could be lethal. - Does the patient have access to the means: Yes. - Does the patient have social support: Yes.  [name] is his partner for over 20 years.  There are significant challenges in the relationship however.    Protective factors (what has kept the patient from self-harm thus far):  If he killed himself, it would affect his family's financial situation and he doesn't want to do that.  Also concerned about hurting (emotionallY) his family.    Substance use / abuse: Identifies as an alcoholic.  Sober for some time.    Presence of hallucinations / delusions: No evidence of this.  Did not ask directly.    History of SI / Attempts: Yes.  Remote with pills.  Family history of attempted or completed suicide: Brother committed suicide with pills.  Mother attempted when patient was around 18.  Duration and Intensity of SI:  Depression for years.  Worsening in the past few weeks.  History of prior psychiatric hospitalizations: Denied.  Chart review for additional risk factors (cite chronic pain, insomnia, panic attacks, age, gender, if present): Medically complex including history of CVA.  Coping mechanisms: Watching wrestling on television.  Playing with grandchildren.  Spending time with [name].  How likely are you to act on these thoughts of hurting yourself or ending your life sometime over the next month?  SOMEWHAT LIKELY  If somewhat or very likely, consider hospitalization.  Consult suicide protocol if you have not yet done so.  PHQ-9:   21 GAD7:  18

## 2017-12-14 NOTE — ED Notes (Signed)
Pt at bedside eating lunch
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
## 2017-12-14 NOTE — Telephone Encounter (Signed)
Schedulers informed. Fleeger, Salome Spotted, CMA

## 2017-12-14 NOTE — Progress Notes (Signed)
I called Donald [name] and his partner, [name]. She reported that they are in the ED with a long wait time. I encouraged her to continue to wait. I also gave her information concerning support groups for caregivers.

## 2017-12-14 NOTE — ED Notes (Signed)
Pt visitor has belongings. Pt changed in to purple scrubs and security to wand pt.

## 2017-12-14 NOTE — ED Triage Notes (Signed)
Patient sent from [facility] family practice for evaluation of depression. Denies suicidal and homicidal thoughts. Alert and oriented, states that he thinks with recent medical problems have made him down. Recently stopped smoking as well

## 2017-12-14 NOTE — ED Notes (Signed)
Declined W/C at D/C and was escorted to lobby by RN. 

## 2017-12-14 NOTE — ED Notes (Signed)
Wonda Cerise, sister [phone]

## 2017-12-14 NOTE — Assessment & Plan Note (Signed)
Patient tearful in the room.  Difficult time conversing because of his distress.  Communication issues might be CVA related though.  Behavioral Health Consultant Larene Beach) nor I know this patient well.  Given his age, history of attempt, family history, and medical history, we thought further evaluation, specifically for inpatient, was warranted.  [name] (his partner) agreed to take him to [facility] ED.  Charge nurse was contacted.  Larene Beach will follow by phone today to see how they are doing.

## 2017-12-14 NOTE — Discharge Instructions (Signed)
Please follow up with:  [facility]  [place], [place]  Ask for Velva Harman to schedule an intake appointment for Partial Hospitalization Program or Intensive Inpatient Program for therapy and Psychiatry for medication management.

## 2017-12-14 NOTE — [facility]HH Suicide Risk Assessment (Cosign Needed)
Suicide Risk Assessment  Discharge Assessment   [facility] Discharge Suicide Risk Assessment   Principal Problem: Depressed mood Discharge Diagnoses:  Patient Active Problem List   Diagnosis Date Noted  . BPH (benign prostatic hyperplasia) [N40.0] 10/06/2017  . Constipation [K59.00] 10/06/2017  . Stroke ([place]) [I63.9] 06/11/2017  . Cerebral ventriculomegaly [G93.89]   . Altered mental status [R41.82] 06/10/2017  . Depressed mood [F32.9] 06/06/2017  . Abdominal pain [R10.9] 05/23/2017  . Left sided abdominal pain [R10.9] 08/06/2016  . Headache [R51] 08/06/2016  . History of CVA (cerebrovascular accident) [Z86.73] 08/06/2016  . Diverticulosis [K57.90] 11/23/2014  . Coronary artery disease [I25.10] 11/23/2014  . Cerebral aneurysm [I67.1] 11/23/2014  . Clostridium difficile colitis [A04.72]     Total Time spent with patient: 45 minutes  Musculoskeletal: Strength & Muscle Tone: within normal limits Gait & Station: normal Patient leans: N/A  Psychiatric Specialty Exam: Physical Exam  Constitutional: He is oriented to person, place, and time. He appears well-developed and well-nourished.  HENT:  Head: Normocephalic.  Respiratory: Effort normal.  Musculoskeletal: Normal range of motion.  Neurological: He is alert and oriented to person, place, and time.  Psychiatric: His speech is normal and behavior is normal. Thought content normal. His mood appears anxious. Cognition and memory are normal. He expresses impulsivity.   Review of Systems  Psychiatric/Behavioral: Positive for depression. Negative for hallucinations, memory loss, substance abuse and suicidal ideas. The patient is not nervous/anxious and does not have insomnia.   All other systems reviewed and are negative.  Blood pressure (!) 121/56, pulse 72, temperature 98.4 F (36.9 C), temperature source Oral, resp. rate 17, height 5\' 4"  (1.626 m), weight 145 lb (65.8 kg), SpO2 97 %.Body mass index is 24.89 kg/m. General  Appearance: Casual Eye Contact:  Good Speech:  Clear and Coherent and Normal Rate Volume:  Normal Mood:  Depressed Affect:  Congruent and Depressed Thought Process:  Coherent, Goal Directed and Linear Orientation:  Full (Time, Place, and Person) Thought Content:  Logical Suicidal Thoughts:  No Homicidal Thoughts:  No Memory:  Immediate;   Good Recent;   Good Remote;   Fair Judgement:  Fair Insight:  Fair Psychomotor Activity:  Normal Concentration:  Concentration: Good and Attention Span: Good Recall:  Good Fund of Knowledge:  Good Language:  Good Akathisia:  No Handed:  Right AIMS (if indicated):    Assets:  Communication Skills ADL's:  Intact Cognition:  WNL Sleep:   Good   Mental Status Per Nursing Assessment::   On Admission:   Depressed and tearful  Demographic Factors:  Male, Caucasian and Unemployed  Loss Factors: Decline in physical health  Historical Factors: Impulsivity  Risk Reduction Factors:   Sense of responsibility to family and Living with another person, especially a relative  Continued Clinical Symptoms:  Depression:   Impulsivity Medical Diagnoses and Treatments/Surgeries  Cognitive Features That Contribute To Risk:  Closed-mindedness    Suicide Risk:  Minimal: No identifiable suicidal ideation.  Patients presenting with no risk factors but with morbid ruminations; may be classified as minimal risk based on the severity of the depressive symptoms    Plan Of Care/Follow-up recommendations:  Activity:  as tolerated Diet:  Heart healthy  Ethelene Hal, NP 12/14/2017, 5:24 PM

## 2017-12-14 NOTE — ED Notes (Signed)
Pt ambulated to the restroom with out difficulty.

## 2017-12-14 NOTE — ED Triage Notes (Signed)
Sister in room with PT . PT waiting for ride home.

## 2017-12-16 ENCOUNTER — Ambulatory Visit (HOSPITAL_COMMUNITY)
Admission: RE | Admit: 2017-12-16 | Discharge: 2017-12-16 | Disposition: A | Discharge status: Home / Self Care | Payer: Medicare HMO | Source: Ambulatory Visit | Attending: Family Medicine | Admitting: Family Medicine

## 2017-12-16 DIAGNOSIS — R4020 Unspecified coma: Secondary | ICD-10-CM | POA: Diagnosis not present

## 2017-12-16 DIAGNOSIS — G9389 Other specified disorders of brain: Secondary | ICD-10-CM | POA: Insufficient documentation

## 2017-12-16 DIAGNOSIS — R4182 Altered mental status, unspecified: Secondary | ICD-10-CM | POA: Insufficient documentation

## 2017-12-16 DIAGNOSIS — R410 Disorientation, unspecified: Secondary | ICD-10-CM | POA: Diagnosis not present

## 2017-12-28 ENCOUNTER — Ambulatory Visit (INDEPENDENT_AMBULATORY_CARE_PROVIDER_SITE_OTHER): Payer: Medicare HMO | Admitting: Family Medicine

## 2017-12-28 ENCOUNTER — Encounter: Payer: Self-pay | Admitting: Family Medicine

## 2017-12-28 ENCOUNTER — Other Ambulatory Visit: Payer: Self-pay

## 2017-12-28 VITALS — BP 86/48 | HR 80 | Temp 97.9°F | Ht 63.0 in | Wt 141.8 lb

## 2017-12-28 DIAGNOSIS — I25110 Atherosclerotic heart disease of native coronary artery with unstable angina pectoris: Secondary | ICD-10-CM | POA: Diagnosis not present

## 2017-12-28 DIAGNOSIS — I6300 Cerebral infarction due to thrombosis of unspecified precerebral artery: Secondary | ICD-10-CM

## 2017-12-28 DIAGNOSIS — Z8673 Personal history of transient ischemic attack (TIA), and cerebral infarction without residual deficits: Secondary | ICD-10-CM

## 2017-12-28 NOTE — Progress Notes (Signed)
   CC: follow up MRI  HPI  Memory - "am I gonna get any better?" worried about memory. Mood swings,changing his mind back and forth. Considering personal care assistant. He thought wife had left the other day and was panicing when she was just asleep in the other room.   Wife has googled some about memory loss after stroke and was worried that we may be discussing the fact that he may not get better.   BP - states Dr. Loletha Grayer decreased lisinopril at last visit and they have follow up soon. His BP has been this low at home recently as well. No falls.   ROS: Denies CP, SOB, abdominal pain, dysuria, changes in BMs.   CC, SH/smoking status, and VS noted  Objective: BP (!) 86/48   Pulse 80   Temp 97.9 F (36.6 C) (Oral)   Ht 5\' 3"  (1.6 m)   Wt 141 lb 12.8 oz (64.3 kg)   SpO2 98%   BMI 25.12 kg/m  Gen: NAD, alert, cooperative, and pleasant. HEENT: NCAT, EOMI, PERRL CV: RRR, no murmur Resp: CTAB, no wheezes, non-labored Ext: No edema, warm Neuro: Alert and oriented, Speech clear, wide based gait   Assessment and plan:  History of CVA (cerebrovascular accident) Repeat MRI with significant encephalomalacia and ventriculomegaly. Explained to patient and wife that at 7 months we are unlikely to see more neurological recovery than present, that this may be his new normal. Began discussion around palliative and GOC, they are amenable to palliative consult. They would like to develop advanced directives. Will also involve [facility] to consider [place] or other coordination of care.    Coronary artery disease Seeing Dr. Loletha Grayer now for cards, hypotensive repeatedly at home as well as here today. Will d/c lisinopril as CAD benefit is likely minimal in the setting of progressive memory loss.    Orders Placed This Encounter  Procedures  . Amb Referral to Palliative Care    Referral Priority:   Routine    Referral Type:   Consultation    Number of Visits Requested:   1  . Consult to [facility]   Order Specific Question:   Reason for consult    Answer:   coordination of care with stroke sequelae    Order Specific Question:   Diagnoses of    Answer:   Stroke: Ischemic/TIA    Order Specific Question:   Expected date of contact    Answer:   within 10 days    No orders of the defined types were placed in this encounter.   Ralene Ok, MD, PGY3 12/29/2017 2:17 PM

## 2017-12-28 NOTE — Patient Instructions (Signed)
It was a pleasure to see you today! Thank you for choosing [facility] for your primary care. Donald [name] was seen for memory.   Our plans for today were:  Please STOP COMPLETELY your lisinopril.   The palliative care team will call you to schedule an appt, again they are associated with hospice, but you are NOT a hospice candidate at this point.   You should return to our clinic to see Dr. Lindell Noe in 2 months for coordination of care.   Best,  Dr. Lindell Noe

## 2017-12-29 ENCOUNTER — Telehealth: Payer: Self-pay

## 2017-12-29 NOTE — Assessment & Plan Note (Addendum)
Repeat MRI with significant encephalomalacia and ventriculomegaly. Explained to patient and wife that at 7 months we are unlikely to see more neurological recovery than present, that this may be his new normal. Began discussion around palliative and GOC, they are amenable to palliative consult. They would like to develop advanced directives. Will also involve [facility] to consider [place] or other coordination of care.

## 2017-12-29 NOTE — Assessment & Plan Note (Signed)
Seeing Dr. Loletha Grayer now for cards, hypotensive repeatedly at home as well as here today. Will d/c lisinopril as CAD benefit is likely minimal in the setting of progressive memory loss.

## 2017-12-29 NOTE — Telephone Encounter (Signed)
VM left to offer to schedule visit with Palliative Care 

## 2017-12-30 ENCOUNTER — Telehealth: Payer: Self-pay

## 2017-12-30 NOTE — Telephone Encounter (Signed)
Received phone call from patient's daughter to schedule visit with Palliative Care. Visit scheduled for Monday 01/04/18. Address verified

## 2018-01-04 ENCOUNTER — Other Ambulatory Visit: Payer: Medicare HMO | Admitting: Hospice and Palliative Medicine

## 2018-01-04 DIAGNOSIS — Z515 Encounter for palliative care: Secondary | ICD-10-CM

## 2018-01-05 NOTE — Progress Notes (Signed)
PALLIATIVE CARE CONSULT VISIT   PATIENT NAME: Donald [name] DOB: [birth year] MRN: [id number]  PRIMARY CARE PROVIDER:   [name], MD  REFERRING PROVIDER:  [name], MD [place], [place] [address]  RESPONSIBLE PARTY:   Self  ASSESSMENT:     I met with patient and his significant other in the home. I reviewed the role of community palliative care and we discussed patient's illness trajectory. He has some mild confusion at baseline following a history of CVA. However, patient is still essentially independent with his care. He ambulates without use of an assistive device, although, he has a shuffling gait and has a history of falls.   He lives at home with his significant other. She has some concerns with patient being left alone due to confusion. She is interested in speaking with SW to explore placement or ALFs. Will ask our SW to reach out to her.   We had a lengthy conversation today about advance directives, of which the patient does not currently have but is interested in establishing. I left him the HCPOA and living will document and instructed him on completion. He says he would want his significant other to be his decision maker if necessary. They have been together for ~20 years and he has no children. Patient does reportedly have a sister and nieces who are involved and patient fears they might be more aggressive with his medical care than he would want. We completed a MOST form and patient clearly stated that he did not want any life prolonging measures including machines, resuscitation, or artificial feeding. His MOST details: DNAR, limited scope of treatment, IV fluids and antibiotics if indicated, and no feeding tube. Patient would be agreeable to treatment and hospitalization if indicated.   RECOMMENDATIONS and PLAN:  1. DNR 2. MOST form completed 3. Recommend completion of HCPOA document 4. SW consult  I spent 60 minutes providing this  consultation,  from 1100 to 1200. More than 50% of the time in this consultation was spent coordinating communication.   HISTORY OF PRESENT ILLNESS:  Donald [name] is a 62 y.o. year old male with multiple medical problems including CAD and CVDz with h/o multiple past CVAs, memory loss. Palliative Care was asked to help address goals of care.   CODE STATUS: DNR  PPS: 40% HOSPICE ELIGIBILITY/DIAGNOSIS: NO  PAST MEDICAL HISTORY:  Past Medical History:  Diagnosis Date  . Cerebral aneurysm without rupture    Followed by Dr. Melrose Nakayama, on Plavix  . CHF (congestive heart failure) ([place])   . Clostridium difficile colitis December 2015   Presented with sepsis, required stool transplant  . Coronary artery disease   . GERD (gastroesophageal reflux disease)   . Hypothyroidism   . Stroke [facility])    NOV 2015    SOCIAL HX:  Social History   Tobacco Use  . Smoking status: Former Smoker    Types: Cigarettes  . Smokeless tobacco: Never Used  Substance Use Topics  . Alcohol use: No    ALLERGIES:  Allergies  Allergen Reactions  . Penicillins Shortness Of Breath and Rash    Has patient had a PCN reaction causing immediate rash, facial/tongue/throat swelling, SOB or lightheadedness with hypotension: Yes Has patient had a PCN reaction causing severe rash involving mucus membranes or skin necrosis: No Has patient had a PCN reaction that required hospitalization: No Has patient had a PCN reaction occurring within the last 10 years: Yes If all of the above  answers are "NO", then may proceed with Cephalosporin use.   . Sulfa Antibiotics Hives, Shortness Of Breath and Rash  . Tramadol Nausea And Vomiting     PERTINENT MEDICATIONS:  Outpatient Encounter Medications as of 01/04/2018  Medication Sig  . aspirin EC 325 MG EC tablet Take 1 tablet (325 mg total) by mouth daily.  Marland Kitchen atorvastatin (LIPITOR) 40 MG tablet TAKE 1 TABLET EVERY DAY  . carvedilol (COREG) 3.125 MG tablet Take 3.125 mg by mouth 2  (two) times daily with a meal.  . gabapentin (NEURONTIN) 100 MG capsule TAKE 1 CAPSULE TWICE DAILY  . niacin 250 MG tablet Take 250 mg by mouth daily with breakfast.  . nitroGLYCERIN (NITROSTAT) 0.4 MG SL tablet Place 0.4 mg under the tongue every 5 (five) minutes as needed for chest pain. May take up to 3 doses.  . ranitidine (ZANTAC) 150 MG tablet Take 1 tablet (150 mg total) by mouth daily.  . sertraline (ZOLOFT) 50 MG tablet TAKE 1 TABLET EVERY DAY  . tamsulosin (FLOMAX) 0.4 MG CAPS capsule TAKE 1 CAPSULE EVERY DAY (Patient not taking: Reported on 12/14/2017)  . tamsulosin (FLOMAX) 0.4 MG CAPS capsule Take 1 capsule (0.4 mg total) by mouth daily.  . vitamin B-12 (CYANOCOBALAMIN) 1000 MCG tablet Take 1,000 mcg by mouth daily.   No facility-administered encounter medications on file as of 01/04/2018.     PHYSICAL EXAM:   General: NAD, frail appearing, thin Cardiovascular: regular rate and rhythm Pulmonary: clear ant fields Abdomen: soft, nontender, + bowel sounds GU: no suprapubic tenderness Extremities: no edema, no joint deformities Skin: no rashes Neurological: Weakness, shuffling gait, oriented to person, place and situation  Irean Hong, NP

## 2018-01-07 ENCOUNTER — Ambulatory Visit (INDEPENDENT_AMBULATORY_CARE_PROVIDER_SITE_OTHER): Payer: Medicare HMO | Admitting: *Deleted

## 2018-01-07 DIAGNOSIS — I6300 Cerebral infarction due to thrombosis of unspecified precerebral artery: Secondary | ICD-10-CM | POA: Diagnosis not present

## 2018-01-08 ENCOUNTER — Other Ambulatory Visit: Payer: Self-pay

## 2018-01-08 NOTE — Progress Notes (Signed)
Carelink Summary Report / Loop Recorder 

## 2018-01-08 NOTE — Patient Outreach (Signed)
[facility]) Care Management  01/08/2018  Donald [name] 12/02/1955 [id number]  TELEPHONE SCREENING Referral date: 12/29/17 Referral source: primary MD Referral reason: coordination of care with stroke sequelae Insurance: [employer]  Telephone call to patient regarding primary MD referral. Call taken by patients caregiver and designated party release, Caswell Corwin.  HIPAA verified for patient by caregiver.  Caregiver states patient had stroke in December 2018.  States patient needs assistance with applying for Medicaid, needs personal care aid and senior apartment assistance.  She states patient does not have any in home services at this time. Caregiver states Palliative care visited their home and did an evaluation with patient. Caregiver states she is unsure whether palliative care will be providing services to patient. RNCM confirmed patient has contact phone number for palliative care. Advised to call and clarify whether services will be provided.  Caregiver states patient is having issues with his memory. States patient becomes disoriented at time to time and place.  She states the doctor does not want patient staying by himself or going outside alone. Caregiver states patient has no peripheral vision in his right eye. States patient still experiences vertigo.  She states patient trips over and runs into things. She states patient also shuffles when he walks. She reports patient has fallen a few times. States patient refuses to use cane and/ or walker when ambulating.  Caregiver states patient receives his medication via [employer] order. States patient is suppose to use pill box but doesn't.  Caregiver states she tires to oversee patients medication but patient feels like she is treating him like a child.   Caregiver states patient is wearing a loop recorder. Caregiver reports patient does have a DNR order. RNCM discussed and offered [facility] care management services. Caregiver  verbally agreed to services for patient.   PLAN: RNCM will refer patient to Education officer, museum, pharmacist, and community case Freight forwarder.   Quinn Plowman RN,BSN,CCM [facility] Telephonic  [phone]      PLAN:

## 2018-01-11 ENCOUNTER — Other Ambulatory Visit: Payer: Self-pay

## 2018-01-11 ENCOUNTER — Emergency Department (HOSPITAL_COMMUNITY): Payer: Medicare HMO

## 2018-01-11 ENCOUNTER — Emergency Department (HOSPITAL_COMMUNITY)
Admission: EM | Admit: 2018-01-11 | Discharge: 2018-01-12 | Disposition: A | Discharge status: Other Acute Inpatient Hospital | Payer: Medicare HMO | Attending: Emergency Medicine | Admitting: Emergency Medicine

## 2018-01-11 ENCOUNTER — Other Ambulatory Visit: Payer: Self-pay | Admitting: Licensed Clinical Social Worker

## 2018-01-11 DIAGNOSIS — Y999 Unspecified external cause status: Secondary | ICD-10-CM | POA: Insufficient documentation

## 2018-01-11 DIAGNOSIS — I509 Heart failure, unspecified: Secondary | ICD-10-CM | POA: Diagnosis not present

## 2018-01-11 DIAGNOSIS — I2510 Atherosclerotic heart disease of native coronary artery without angina pectoris: Secondary | ICD-10-CM | POA: Diagnosis not present

## 2018-01-11 DIAGNOSIS — R0902 Hypoxemia: Secondary | ICD-10-CM | POA: Diagnosis not present

## 2018-01-11 DIAGNOSIS — F322 Major depressive disorder, single episode, severe without psychotic features: Secondary | ICD-10-CM | POA: Insufficient documentation

## 2018-01-11 DIAGNOSIS — X838XXA Intentional self-harm by other specified means, initial encounter: Secondary | ICD-10-CM | POA: Insufficient documentation

## 2018-01-11 DIAGNOSIS — Z7982 Long term (current) use of aspirin: Secondary | ICD-10-CM | POA: Insufficient documentation

## 2018-01-11 DIAGNOSIS — T1491XA Suicide attempt, initial encounter: Secondary | ICD-10-CM | POA: Diagnosis not present

## 2018-01-11 DIAGNOSIS — T463X2A Poisoning by coronary vasodilators, intentional self-harm, initial encounter: Secondary | ICD-10-CM | POA: Diagnosis not present

## 2018-01-11 DIAGNOSIS — T50902A Poisoning by unspecified drugs, medicaments and biological substances, intentional self-harm, initial encounter: Secondary | ICD-10-CM | POA: Diagnosis not present

## 2018-01-11 DIAGNOSIS — Y929 Unspecified place or not applicable: Secondary | ICD-10-CM | POA: Diagnosis not present

## 2018-01-11 DIAGNOSIS — G4489 Other headache syndrome: Secondary | ICD-10-CM | POA: Diagnosis not present

## 2018-01-11 DIAGNOSIS — Z87891 Personal history of nicotine dependence: Secondary | ICD-10-CM | POA: Diagnosis not present

## 2018-01-11 DIAGNOSIS — Z79899 Other long term (current) drug therapy: Secondary | ICD-10-CM | POA: Insufficient documentation

## 2018-01-11 DIAGNOSIS — Y9389 Activity, other specified: Secondary | ICD-10-CM | POA: Diagnosis not present

## 2018-01-11 DIAGNOSIS — R000 Tachycardia, unspecified: Secondary | ICD-10-CM | POA: Diagnosis not present

## 2018-01-11 DIAGNOSIS — E039 Hypothyroidism, unspecified: Secondary | ICD-10-CM | POA: Insufficient documentation

## 2018-01-11 DIAGNOSIS — R079 Chest pain, unspecified: Secondary | ICD-10-CM | POA: Diagnosis not present

## 2018-01-11 DIAGNOSIS — R42 Dizziness and giddiness: Secondary | ICD-10-CM | POA: Diagnosis not present

## 2018-01-11 LAB — COMPREHENSIVE METABOLIC PANEL
ALK PHOS: 16 U/L — AB (ref 38–126)
ALT: 17 U/L (ref 0–44)
AST: 16 U/L (ref 15–41)
Albumin: 3.6 g/dL (ref 3.5–5.0)
Anion gap: 12 (ref 5–15)
BUN: 23 mg/dL (ref 8–23)
CALCIUM: 9.2 mg/dL (ref 8.9–10.3)
CO2: 23 mmol/L (ref 22–32)
CREATININE: 1.54 mg/dL — AB (ref 0.61–1.24)
Chloride: 103 mmol/L (ref 98–111)
GFR, EST AFRICAN AMERICAN: 54 mL/min — AB (ref >60)
GFR, EST NON AFRICAN AMERICAN: 47 mL/min — AB (ref >60)
Glucose, Bld: 88 mg/dL (ref 70–99)
Potassium: 3.9 mmol/L (ref 3.5–5.1)
SODIUM: 138 mmol/L (ref 135–145)
Total Bilirubin: 0.7 mg/dL (ref 0.3–1.2)
Total Protein: 6.7 g/dL (ref 6.5–8.1)

## 2018-01-11 LAB — RAPID URINE DRUG SCREEN, HOSP PERFORMED
AMPHETAMINES: NOT DETECTED
BENZODIAZEPINES: NOT DETECTED
COCAINE: NOT DETECTED
OPIATES: NOT DETECTED
TETRAHYDROCANNABINOL: NOT DETECTED

## 2018-01-11 LAB — CBC
HCT: 37.5 % — ABNORMAL LOW (ref 39.0–52.0)
HEMOGLOBIN: 12.1 g/dL — AB (ref 13.0–17.0)
MCH: 28.3 pg (ref 26.0–34.0)
MCHC: 32.3 g/dL (ref 30.0–36.0)
MCV: 87.8 fL (ref 78.0–100.0)
Platelets: 245 10*3/uL (ref 150–400)
RBC: 4.27 MIL/uL (ref 4.22–5.81)
RDW: 13.5 % (ref 11.5–15.5)
WBC: 9.3 10*3/uL (ref 4.0–10.5)

## 2018-01-11 LAB — TROPONIN I: Troponin I: <0.03 ng/mL (ref <0.03)

## 2018-01-11 LAB — I-STAT TROPONIN, ED: Troponin i, poc: 0.01 ng/mL (ref 0.00–0.08)

## 2018-01-11 LAB — ETHANOL: Alcohol, Ethyl (B): <10 mg/dL (ref <10)

## 2018-01-11 LAB — SALICYLATE LEVEL

## 2018-01-11 LAB — ACETAMINOPHEN LEVEL: Acetaminophen (Tylenol), Serum: <10 ug/mL — ABNORMAL LOW (ref 10–30)

## 2018-01-11 NOTE — ED Triage Notes (Signed)
Pt coming from home. CC of suicide attempt. Pt stated he took about 20 nitro from the bottle. Bottle appeared to be filled with other med such as aspirin. Pt stated he had a headache and dizziness but symptoms resolved. Stated did not have chest pain when he took nitro. Pt alert and oriented x4.

## 2018-01-11 NOTE — ED Notes (Signed)
Called [place] control center and spoke with denise. Pt recommended for 6 hours of cardiac monitoring. EKG, and salicylate labs to be drawn.
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
## 2018-01-11 NOTE — Patient Outreach (Signed)
[facility]) Care Management  01/11/2018  Donald [name] 22-Oct-1955 [id number]  Assessment-CSW completed outreach attempt today after receiving new referral. CSW unable to reach patient's caregiver successfully. CSW left a HIPPA compliant voice message encouraging a return call once available.  Plan-CSW will await return call or complete an additional outreach if needed.  Eula Fried, BSW, MSW, [email or web address] Phone: [phone] Fax: [phone]

## 2018-01-12 ENCOUNTER — Inpatient Hospital Stay (HOSPITAL_COMMUNITY)
Admission: AD | Admit: 2018-01-12 | Discharge: 2018-01-15 | DRG: 884 | Disposition: A | Discharge status: Home / Self Care | Payer: Medicare HMO | Source: Intra-hospital | Attending: Psychiatry | Admitting: Psychiatry

## 2018-01-12 ENCOUNTER — Encounter (HOSPITAL_COMMUNITY): Payer: Self-pay

## 2018-01-12 ENCOUNTER — Other Ambulatory Visit: Payer: Self-pay

## 2018-01-12 ENCOUNTER — Other Ambulatory Visit: Payer: Self-pay | Admitting: Licensed Clinical Social Worker

## 2018-01-12 ENCOUNTER — Other Ambulatory Visit: Payer: Self-pay | Admitting: Pharmacist

## 2018-01-12 DIAGNOSIS — Z95818 Presence of other cardiac implants and grafts: Secondary | ICD-10-CM | POA: Diagnosis not present

## 2018-01-12 DIAGNOSIS — Z8639 Personal history of other endocrine, nutritional and metabolic disease: Secondary | ICD-10-CM

## 2018-01-12 DIAGNOSIS — Z87890 Personal history of sex reassignment: Secondary | ICD-10-CM

## 2018-01-12 DIAGNOSIS — I2510 Atherosclerotic heart disease of native coronary artery without angina pectoris: Secondary | ICD-10-CM | POA: Diagnosis not present

## 2018-01-12 DIAGNOSIS — E1140 Type 2 diabetes mellitus with diabetic neuropathy, unspecified: Secondary | ICD-10-CM | POA: Diagnosis not present

## 2018-01-12 DIAGNOSIS — Z882 Allergy status to sulfonamides status: Secondary | ICD-10-CM | POA: Diagnosis not present

## 2018-01-12 DIAGNOSIS — Z87891 Personal history of nicotine dependence: Secondary | ICD-10-CM | POA: Diagnosis not present

## 2018-01-12 DIAGNOSIS — Z7982 Long term (current) use of aspirin: Secondary | ICD-10-CM | POA: Diagnosis not present

## 2018-01-12 DIAGNOSIS — G4700 Insomnia, unspecified: Secondary | ICD-10-CM | POA: Diagnosis present

## 2018-01-12 DIAGNOSIS — F419 Anxiety disorder, unspecified: Secondary | ICD-10-CM | POA: Diagnosis not present

## 2018-01-12 DIAGNOSIS — Z915 Personal history of self-harm: Secondary | ICD-10-CM | POA: Diagnosis not present

## 2018-01-12 DIAGNOSIS — T463X2A Poisoning by coronary vasodilators, intentional self-harm, initial encounter: Secondary | ICD-10-CM | POA: Diagnosis not present

## 2018-01-12 DIAGNOSIS — Z8679 Personal history of other diseases of the circulatory system: Secondary | ICD-10-CM

## 2018-01-12 DIAGNOSIS — F322 Major depressive disorder, single episode, severe without psychotic features: Secondary | ICD-10-CM | POA: Diagnosis present

## 2018-01-12 DIAGNOSIS — Z8619 Personal history of other infectious and parasitic diseases: Secondary | ICD-10-CM | POA: Diagnosis not present

## 2018-01-12 DIAGNOSIS — I69398 Other sequelae of cerebral infarction: Secondary | ICD-10-CM | POA: Diagnosis not present

## 2018-01-12 DIAGNOSIS — I69319 Unspecified symptoms and signs involving cognitive functions following cerebral infarction: Secondary | ICD-10-CM

## 2018-01-12 DIAGNOSIS — N400 Enlarged prostate without lower urinary tract symptoms: Secondary | ICD-10-CM | POA: Diagnosis present

## 2018-01-12 DIAGNOSIS — Z79899 Other long term (current) drug therapy: Secondary | ICD-10-CM

## 2018-01-12 DIAGNOSIS — T50902A Poisoning by unspecified drugs, medicaments and biological substances, intentional self-harm, initial encounter: Secondary | ICD-10-CM | POA: Diagnosis not present

## 2018-01-12 DIAGNOSIS — I252 Old myocardial infarction: Secondary | ICD-10-CM

## 2018-01-12 DIAGNOSIS — F01518 Vascular dementia, unspecified severity, with other behavioral disturbance: Secondary | ICD-10-CM

## 2018-01-12 DIAGNOSIS — Z630 Problems in relationship with spouse or partner: Secondary | ICD-10-CM

## 2018-01-12 DIAGNOSIS — K219 Gastro-esophageal reflux disease without esophagitis: Secondary | ICD-10-CM | POA: Diagnosis present

## 2018-01-12 DIAGNOSIS — I509 Heart failure, unspecified: Secondary | ICD-10-CM | POA: Diagnosis not present

## 2018-01-12 DIAGNOSIS — F0151 Vascular dementia with behavioral disturbance: Principal | ICD-10-CM | POA: Diagnosis present

## 2018-01-12 DIAGNOSIS — Z880 Allergy status to penicillin: Secondary | ICD-10-CM

## 2018-01-12 DIAGNOSIS — T1491XA Suicide attempt, initial encounter: Secondary | ICD-10-CM | POA: Diagnosis not present

## 2018-01-12 DIAGNOSIS — Z885 Allergy status to narcotic agent status: Secondary | ICD-10-CM

## 2018-01-12 DIAGNOSIS — E039 Hypothyroidism, unspecified: Secondary | ICD-10-CM | POA: Diagnosis not present

## 2018-01-12 LAB — CUP PACEART REMOTE DEVICE CHECK
Date Time Interrogation Session: 20190608194054
Implantable Pulse Generator Implant Date: 20190130

## 2018-01-12 MED ORDER — ATORVASTATIN CALCIUM 40 MG PO TABS
40.0000 mg | ORAL_TABLET | Freq: Every day | ORAL | Status: DC
  Administered 2018-01-13 – 2018-01-15 (×3): 40 mg via ORAL
  Filled 2018-01-12 (×5): qty 1

## 2018-01-12 MED ORDER — SERTRALINE HCL 50 MG PO TABS
50.0000 mg | ORAL_TABLET | Freq: Every day | ORAL | Status: DC
  Administered 2018-01-13: 50 mg via ORAL
  Filled 2018-01-12 (×2): qty 1

## 2018-01-12 MED ORDER — ATORVASTATIN CALCIUM 40 MG PO TABS
40.0000 mg | ORAL_TABLET | Freq: Every day | ORAL | Status: DC
  Administered 2018-01-12: 40 mg via ORAL
  Filled 2018-01-12: qty 1

## 2018-01-12 MED ORDER — NIACIN 250 MG PO TABS
250.0000 mg | ORAL_TABLET | Freq: Every day | ORAL | Status: DC
  Administered 2018-01-13 – 2018-01-15 (×3): 250 mg via ORAL
  Filled 2018-01-12 (×5): qty 1

## 2018-01-12 MED ORDER — SERTRALINE HCL 50 MG PO TABS
50.0000 mg | ORAL_TABLET | Freq: Every day | ORAL | Status: DC
  Administered 2018-01-12: 50 mg via ORAL
  Filled 2018-01-12: qty 1

## 2018-01-12 MED ORDER — NIACIN 250 MG PO TABS
250.0000 mg | ORAL_TABLET | Freq: Every day | ORAL | Status: DC
  Filled 2018-01-12 (×2): qty 1

## 2018-01-12 MED ORDER — ASPIRIN EC 325 MG PO TBEC
325.0000 mg | DELAYED_RELEASE_TABLET | Freq: Every day | ORAL | Status: DC
  Administered 2018-01-12: 325 mg via ORAL
  Filled 2018-01-12: qty 1

## 2018-01-12 MED ORDER — GABAPENTIN 100 MG PO CAPS
100.0000 mg | ORAL_CAPSULE | Freq: Two times a day (BID) | ORAL | Status: DC
  Administered 2018-01-12 (×2): 100 mg via ORAL
  Filled 2018-01-12 (×2): qty 1

## 2018-01-12 MED ORDER — FAMOTIDINE 20 MG PO TABS
10.0000 mg | ORAL_TABLET | Freq: Every day | ORAL | Status: DC
  Administered 2018-01-12: 10 mg via ORAL
  Filled 2018-01-12: qty 1

## 2018-01-12 MED ORDER — TAMSULOSIN HCL 0.4 MG PO CAPS
0.4000 mg | ORAL_CAPSULE | Freq: Every day | ORAL | Status: DC
  Administered 2018-01-13 – 2018-01-15 (×3): 0.4 mg via ORAL
  Filled 2018-01-12 (×5): qty 1

## 2018-01-12 MED ORDER — ACETAMINOPHEN 325 MG PO TABS
650.0000 mg | ORAL_TABLET | ORAL | Status: DC | PRN
Start: 2018-01-12 — End: 2018-01-12

## 2018-01-12 MED ORDER — ACETAMINOPHEN 325 MG PO TABS: 650.0000 mg | ORAL_TABLET | ORAL | Status: DC | PRN

## 2018-01-12 MED ORDER — FAMOTIDINE 10 MG PO TABS
10.0000 mg | ORAL_TABLET | Freq: Every day | ORAL | Status: DC
  Administered 2018-01-13 – 2018-01-15 (×3): 10 mg via ORAL
  Filled 2018-01-12 (×5): qty 1

## 2018-01-12 MED ORDER — CARVEDILOL 3.125 MG PO TABS
3.1250 mg | ORAL_TABLET | Freq: Two times a day (BID) | ORAL | Status: DC
  Administered 2018-01-12: 3.125 mg via ORAL
  Filled 2018-01-12 (×2): qty 1

## 2018-01-12 MED ORDER — CARVEDILOL 3.125 MG PO TABS
3.1250 mg | ORAL_TABLET | Freq: Two times a day (BID) | ORAL | Status: DC
  Administered 2018-01-12 – 2018-01-14 (×5): 3.125 mg via ORAL
  Filled 2018-01-12 (×12): qty 1

## 2018-01-12 MED ORDER — ASPIRIN EC 325 MG PO TBEC
325.0000 mg | DELAYED_RELEASE_TABLET | Freq: Every day | ORAL | Status: DC
  Administered 2018-01-13 – 2018-01-15 (×3): 325 mg via ORAL
  Filled 2018-01-12 (×5): qty 1

## 2018-01-12 MED ORDER — TAMSULOSIN HCL 0.4 MG PO CAPS
0.4000 mg | ORAL_CAPSULE | Freq: Every day | ORAL | Status: DC
  Administered 2018-01-12: 0.4 mg via ORAL
  Filled 2018-01-12: qty 1

## 2018-01-12 MED ORDER — GABAPENTIN 100 MG PO CAPS
100.0000 mg | ORAL_CAPSULE | Freq: Two times a day (BID) | ORAL | Status: DC
  Administered 2018-01-12 – 2018-01-15 (×6): 100 mg via ORAL
  Filled 2018-01-12 (×11): qty 1

## 2018-01-12 NOTE — ED Notes (Signed)
Lying on bed watching tv. Aware transportation has been called to transport him to [facility].

## 2018-01-12 NOTE — Progress Notes (Signed)
Adult Psychoeducational Group Note  Date:  01/12/2018 Time:  9:07 PM  Group Topic/Focus:  Wrap-Up Group:   The focus of this group is to help patients review their daily goal of treatment and discuss progress on daily workbooks.  Participation Level:  Active  Participation Quality:  Appropriate  Affect:  Appropriate  Cognitive:  Alert  Insight: Appropriate  Engagement in Group:  Engaged  Modes of Intervention:  Discussion  Additional Comments:  Patient stated having a rough day. Patient's goal for today is "to get straighten out".  Donald [name] [name] 01/12/2018, 9:07 PM

## 2018-01-12 NOTE — Patient Outreach (Signed)
[facility]) Care Management  01/12/2018  Donald [name] 1955/11/17 [id number]  Upon chart review patient admitted to the hospital.  [facility] will notify [facility] hospital liaisons of admission.   Plan: Will follow up regarding patients discharged disposition within 4 business days.   Quinn Plowman RN,BSN,CCM [facility] Telephonic  [phone]

## 2018-01-12 NOTE — [facility] Group Notes (Signed)
[facility] Group Notes:  (Nursing/MHT/Case Management/Adjunct)  Date:  01/12/2018  Time:  1445  Type of Therapy:  Nurse Education  Participation Level:  Did Not Attend  Summary of Progress/Problems: Pt was invited. Did not attend.  Marya Landry 01/12/2018, 5:29 PM

## 2018-01-12 NOTE — Patient Outreach (Signed)
[facility]) Care Management  01/12/2018  JACKSEN ISIP 07-11-1955 [id number]   [facility] referral to Pharmacy for medication management.  Unsuccessful call to patient's caregiver.  HIPAA compliant voicemail was left with callback information. Per [facility] CSW note & chart review, patient being admitted to [facility] today. Pharmacy will continue to follow patient and complete outreach once patient discharged home.  Unsuccessful outreach was sent by [facility] CSW today.   Plan: -Will follow up outreach 3-5 business days  Regina Eck, PharmD, [place]  [phone]

## 2018-01-12 NOTE — ED Notes (Signed)
Pt ambulatory w/o difficulty to nurses' desk - made phone call then returned to room. Pt remains calm, cooperative, pleasant.

## 2018-01-12 NOTE — ED Notes (Addendum)
[name], sister, and [place] visiting w/pt.

## 2018-01-12 NOTE — Progress Notes (Signed)
Pt accepted to [facility], Bed 406-1 Donald Romp, NP is the accepting provider.  Dr. Neita Garnet is the attending provider.  Call report to [phone]  Becky @ [facility] Psych ED notified.   Pt is IVC   Pt may be transported by [employer] Pt scheduled  to arrive at [facility]@12 :30 PM  [name] T. Judi Cong, MSW, [place] Disposition Clinical Social Work [phone] (cell) [phone] (office)

## 2018-01-12 NOTE — ED Notes (Signed)
Patient was given a snack and drink. 
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
## 2018-01-12 NOTE — ED Notes (Addendum)
ALL belongings - 1 labeled belongings bag and 1 valuables envelope - Deputy - Pt aware. Pt wearing eyeglasses and has dentures in his mouth.

## 2018-01-12 NOTE — ED Notes (Signed)
Pt noted to be calm, cooperative, pleasant. Pt wearing burgundy scrubs and eyeglasses. States he no longer feels SI. States "I did something stupid but I'm over it now". States his live-in girlfriend moved out and now wants to move back in. Voices understanding and agreement w/Inpt tx. Pt is ambulatory w/o assistance and is able to perform own ADL's.

## 2018-01-12 NOTE — Patient Outreach (Signed)
[facility]) Care Management  01/12/2018  JANI PLOEGER July 25, 1955 [id number]  CSW completed outreach attempt today to patient's caregiver today. CSW unable to reach caregiver successfully. CSW left another HIPPA compliant voice message encouraging a return call once available in order to discuss community resource needs. [facility] CSW completed chart review and saw that patient is currently hospitalized after a suicide attempt. Patient has been accepted to [facility]. [facility] CSW will continue to follow patient and will complete final outreach attempt once patient has discharged back home.   Eula Fried, BSW, MSW, [email or web address] Phone: [phone] Fax: [phone]

## 2018-01-12 NOTE — Progress Notes (Signed)
D: Pt was in bed in his room upon initial approach.  Pt presents with depressed affect and mood.  He reports he is "doing good" and his goal is "being alive."  Pt reports he had a good visit with his sister tonight.  Pt denies SI/HI, denies hallucinations, denies pain.  Pt has been visible in milieu interacting with peers and staff appropriately.  Pt attended evening group.    A: Introduced self to pt.  Actively listened to pt and offered support and encouragement.  Fall prevention techniques reviewed with pt and pt verbalized understanding.  Q15 minute safety checks maintained.  R: Pt is safe on the unit.  Pt verbally contracts for safety and reports he will inform staff of needs and concerns.  Will continue to monitor and assess.

## 2018-01-12 NOTE — Tx Team (Signed)
Initial Treatment Plan 01/12/2018 3:48 PM JON LALL GZF:[id number]    PATIENT STRESSORS: Health problems Marital or family conflict   PATIENT STRENGTHS: Ability for insight Motivation for treatment/growth Supportive family/friends   PATIENT IDENTIFIED PROBLEMS: 1. "I just got angry"  2. "I took some pills, and I shouldn't have."                   DISCHARGE CRITERIA:  Improved stabilization in mood, thinking, and/or behavior Verbal commitment to aftercare and medication compliance  PRELIMINARY DISCHARGE PLAN: Outpatient therapy  PATIENT/FAMILY INVOLVEMENT: This treatment plan has been presented to and reviewed with the patient, KORI COLIN.  The patient has been given the opportunity to ask questions and make suggestions.  Lesli Albee, RN 01/12/2018, 3:48 PM

## 2018-01-12 NOTE — ED Notes (Signed)
Breakfast tray ordered 

## 2018-01-12 NOTE — ED Notes (Addendum)
Heart Healthy Diet ordered for lunch.

## 2018-01-12 NOTE — [facility] Group Notes (Signed)
[facility] LCSW Group Therapy Note  Date/Time: 01/12/18, 1315  Type of Therapy/Topic:  Group Therapy:  Feelings about Diagnosis  Participation Level:  Did Not Attend   Mood:   Description of Group:    This group will allow patients to explore their thoughts and feelings about diagnoses they have received. Patients will be guided to explore their level of understanding and acceptance of these diagnoses. Facilitator will encourage patients to process their thoughts and feelings about the reactions of others to their diagnosis, and will guide patients in identifying ways to discuss their diagnosis with significant others in their lives. This group will be process-oriented, with patients participating in exploration of their own experiences as well as giving and receiving support and challenge from other group members.   Therapeutic Goals: 1. Patient will demonstrate understanding of diagnosis as evidence by identifying two or more symptoms of the disorder:  2. Patient will be able to express two feelings regarding the diagnosis 3. Patient will demonstrate ability to communicate their needs through discussion and/or role plays  Summary of Patient Progress:        Therapeutic Modalities:   Cognitive Behavioral Therapy Brief Therapy Feelings Identification   Lurline Idol, LCSW

## 2018-01-12 NOTE — Progress Notes (Signed)
D: Patient arrived to [facility] Adult unit today under IVC status. He came to [facility] ED from home. He states he got into an argument with his wife, and she left. He became suicidal and impulsively took "20 nitro tablets." However, this was not evident in the ED, as his BP was stable. He has a history of a stroke, and has some cognitive and memory deficits. He also has a history of CHF, CAD, C.Diff, GERD, and hypothyroid. UDS negative, alcohol negative, APAP/Salic negative.  A: Skin assessment performed per protocol, no contraband noted, and revealed several tattoos. Otherwise, skin was unremarkable. Patient had no belongings at time of admission. Unit orientation completed. Care plan and unit routines reviewed with patient, understanding verbalized. Emotional support offered to patient. Encouraged patient to voice concerns. Fluids offered to patient. Q15 minute checks initiated for safety on and off unit.  R: Patient mildly confused and requiring redirection to room on the unit.

## 2018-01-13 ENCOUNTER — Other Ambulatory Visit: Payer: Self-pay

## 2018-01-13 DIAGNOSIS — T1491XA Suicide attempt, initial encounter: Secondary | ICD-10-CM

## 2018-01-13 DIAGNOSIS — F322 Major depressive disorder, single episode, severe without psychotic features: Secondary | ICD-10-CM

## 2018-01-13 DIAGNOSIS — F0151 Vascular dementia with behavioral disturbance: Principal | ICD-10-CM

## 2018-01-13 DIAGNOSIS — T463X2A Poisoning by coronary vasodilators, intentional self-harm, initial encounter: Secondary | ICD-10-CM

## 2018-01-13 DIAGNOSIS — F01518 Vascular dementia, unspecified severity, with other behavioral disturbance: Secondary | ICD-10-CM

## 2018-01-13 MED ORDER — TRAZODONE HCL 50 MG PO TABS
50.0000 mg | ORAL_TABLET | Freq: Every evening | ORAL | Status: DC | PRN
  Administered 2018-01-13 – 2018-01-14 (×2): 50 mg via ORAL
  Filled 2018-01-13: qty 1

## 2018-01-13 MED ORDER — SERTRALINE HCL 25 MG PO TABS
25.0000 mg | ORAL_TABLET | Freq: Every day | ORAL | Status: DC
  Administered 2018-01-14 – 2018-01-15 (×2): 25 mg via ORAL
  Filled 2018-01-13 (×4): qty 1

## 2018-01-13 NOTE — [facility] Suicide Risk Assessment (Signed)
[place] INPATIENT:  Family/Significant Other Suicide Prevention Education  Suicide Prevention Education:  Education Completed;  Hebert Soho, [phone] been identified by the patient as the family member/significant other with whom the patient will be residing, and identified as the person(s) who will aid the patient in the event of a mental health crisis (suicidal ideations/suicide attempt).  With written consent from the patient, the family member/significant other has been provided the following suicide prevention education, prior to the and/or following the discharge of the patient.  The suicide prevention education provided includes the following:  Suicide risk factors  Suicide prevention and interventions  National Suicide Hotline telephone number  [facility] assessment telephone number  [facility] Emergency Assistance [place] and/or Residential Mobile Crisis Unit telephone number  Request made of family/significant other to:  Remove weapons (e.g., guns, rifles, knives), all items previously/currently identified as safety concern.  No guns, per [name].  Remove drugs/medications (over-the-counter, prescriptions, illicit drugs), all items previously/currently identified as a safety concern.  The family member/significant other verbalizes understanding of the suicide prevention education information provided.  The family member/significant other agrees to remove the items of safety concern listed above.  [name] reports that pt's ex girlfriend did not want them involved when they were together.  She does not have a lot of info on pt's medical conditions.  [name] told pt he is coming to live with her when he is discharged from [facility] because she is worried that he is forgetful with his medicines.  He does have a walker that he can use at her home.  He has an MD appt this coming Monday, 7/22 that she will take him to if he is discharged.  She thinks  this is his heart MD.  She is willing to check in with pt on safety issues as well.  Joanne Chars, LCSW 01/13/2018, 4:05 PM
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
## 2018-01-13 NOTE — BHH Group Notes (Signed)
[facility] Group Therapy 01/13/2018 1:15pm  Type of Therapy: [facility] Presentation  Participation Level: Did not attend  Participation Quality:   Affect:   Cognitive:   Insight:   Engagement in Therapy:   Modes of Intervention: Discussion, Education and Socialization  Summary of Progress/Problems: [place] Speaker came to talk about his personal journey with mental health. The pt processed ways by which to relate to the speaker. [place] speaker provided handouts and educational information pertaining to groups and services offered by the [facility]. Pt was engaged in speaker's presentation and was receptive to resources provided.    Joanne Chars, LCSW 01/13/2018 12:56 PM

## 2018-01-13 NOTE — Patient Outreach (Signed)
[facility]) Care Management  01/13/2018  Donald [name] 1955-12-04 [id number]  Care coordination follow up: [facility] hospital liaison will not follow patient inpatient.  RNCM and [facility] social worker will follow up with patient once patient is discharged to home.   Quinn Plowman RN,BSN,CCM [facility] Telephonic  [phone]

## 2018-01-13 NOTE — Progress Notes (Signed)
Nursing Note: 0700-1900  D:  Pt presents with depressed mood and sad/flat affect. "I did a stupid thing, I thought it would scare her and she would not leave."  Pt shared that he has a good support system and would like to go home. He is calm and cooperative, soft spoken and moves slowly.  Due to past CVA, pt has walker and cane at home (not always used). Fall precautions discussed, pt verbalized understanding.  A:  Encouraged to verbalize needs and concerns, active listening and support provided.  Continued Q 15 minute safety checks.  Observed active participation in group settings.  R:  Pt. states that he has enjoyed the support received in milieu today, found in dayroom socializing throughout shift.  Denies A/V hallucinations and is able to verbally contract for safety.

## 2018-01-13 NOTE — Progress Notes (Signed)
Pt observed in the dayroom, attending wrap-up group. Pt appears animated/anxious in affect and mood. Pt brigthens on approach. Pt denies SI/HI/AVH/Pain at this time. Pt is preoccupied about discharge; Pt states he hopes to leave by Friday. Pt c/o of insomnia this evening. Provider on call notified and trazodone was ordered and given. Will continue with POC.
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
## 2018-01-13 NOTE — BHH Suicide Risk Assessment (Signed)
[facility] Admission Suicide Risk Assessment   Nursing information obtained from:  Patient Demographic factors:  Male Current Mental Status:  NA Loss Factors:  NA Historical Factors:  NA Risk Reduction Factors:  Living with another person, especially a relative  Total Time spent with patient: 45 minutes Principal Problem: <principal problem not specified> Diagnosis:   Patient Active Problem List   Diagnosis Date Noted  . MDD (major depressive disorder), single episode, severe , no psychosis ([place]) [F32.2] 01/12/2018  . Depression [F32.9]   . BPH (benign prostatic hyperplasia) [N40.0] 10/06/2017  . Constipation [K59.00] 10/06/2017  . Stroke ([place]) [I63.9] 06/11/2017  . Cerebral ventriculomegaly [G93.89]   . Altered mental status [R41.82] 06/10/2017  . Depressed mood [F32.9] 06/06/2017  . Abdominal pain [R10.9] 05/23/2017  . Left sided abdominal pain [R10.9] 08/06/2016  . Headache [R51] 08/06/2016  . History of CVA (cerebrovascular accident) [Z86.73] 08/06/2016  . Diverticulosis [K57.90] 11/23/2014  . Coronary artery disease [I25.10] 11/23/2014  . Cerebral aneurysm [I67.1] 11/23/2014  . Clostridium difficile colitis [A04.72]    Subjective Data: Patient is seen and examined.  Patient is a 62 year old male with past psychiatric history significant for CVA, cerebral aneurysm without rupture, coronary artery disease and probable congestive heart failure who presented to the [facility] emergency department after an intentional overdose of nitroglycerin.  He stated that his partner of over 20 years had left him today.  He stated that since his heart attack 10 years ago he had been unable to perform sexually, and that she left him because she "found another man".  He stated that this caused him significant depression, and he decided that he wanted to die.  He took an intentional overdose of nitroglycerin.  He stated that after he done that he felt lightheaded and sleepy.  He stated that this  was "stupid", and that he is currently not suicidal.  He also admitted that he had done a stupid thing in the past.  He stated that he was first prescribed sertraline 10 years ago by his cardiologist.  He was unclear that this was an antidepressant.  He stated that he had stopped taking it intermittently over the last 10 years.  He stated he had not taken this in over 2 to 5 years.  He admitted the low mood, helplessness, hopelessness and worthlessness.  He was admitted to the hospital for evaluation and stabilization.  Continued Clinical Symptoms:  Alcohol Use Disorder Identification Test Final Score (AUDIT): 1 The "Alcohol Use Disorders Identification Test", Guidelines for Use in Primary Care, Second Edition.  World Pharmacologist [facility]). Score between 0-7:  no or low risk or alcohol related problems. Score between 8-15:  moderate risk of alcohol related problems. Score between 16-19:  high risk of alcohol related problems. Score 20 or above:  warrants further diagnostic evaluation for alcohol dependence and treatment.   CLINICAL FACTORS:   Depression:   Anhedonia Hopelessness Impulsivity Insomnia   Musculoskeletal: Strength & Muscle Tone: within normal limits Gait & Station: unsteady Patient leans: N/A  Psychiatric Specialty Exam: Physical Exam  Nursing note and vitals reviewed. Constitutional: He is oriented to person, place, and time. He appears well-developed and well-nourished.  HENT:  Head: Normocephalic and atraumatic.  Respiratory: Effort normal.  Neurological: He is alert and oriented to person, place, and time.    ROS  Blood pressure 101/63, pulse 79, temperature (!) 97.5 F (36.4 C), resp. rate 16, SpO2 99 %.There is no height or weight on file to calculate BMI.  General Appearance: Disheveled  Eye Contact:  Fair  Speech:  Normal Rate  Volume:  Decreased  Mood:  Depressed  Affect:  Flat  Thought Process:  Coherent  Orientation:  Full (Time, Place, and  Person)  Thought Content:  Logical  Suicidal Thoughts:  Yes.  without intent/plan  Homicidal Thoughts:  No  Memory:  Immediate;   Fair Recent;   Fair Remote;   Fair  Judgement:  Intact  Insight:  Fair  Psychomotor Activity:  Psychomotor Retardation  Concentration:  Concentration: Fair and Attention Span: Fair  Recall:  [employer] of Knowledge:  Fair  Language:  Fair  Akathisia:  Negative  Handed:  Right  AIMS (if indicated):     Assets:  Desire for Improvement Housing Resilience  ADL's:  Intact  Cognition:  WNL  Sleep:  Number of Hours: 6.5      COGNITIVE FEATURES THAT CONTRIBUTE TO RISK:  None    SUICIDE RISK:   Mild:  Suicidal ideation of limited frequency, intensity, duration, and specificity.  There are no identifiable plans, no associated intent, mild dysphoria and related symptoms, good self-control (both objective and subjective assessment), few other risk factors, and identifiable protective factors, including available and accessible social support.  PLAN OF CARE: Patient is seen and examined.  Patient is a 62 year old male with a past psychiatric history significant for major depression who was admitted after an intentional overdose of nitroglycerin.  He will be admitted to the hospital.  He will be integrated into the milieu.  He will be encouraged to attend groups.  He will see social work both individually and in groups.  He will be encouraged to work on his coping skills.  He has been off the Zoloft for at least 2 to 5 years.  I am going to start him at 25 mg a day, and if he tolerates that today probably increase it tomorrow.  We will continue his other medications for his heart disease etc.  We will monitor his blood pressure to make sure that it does not bottom out.  I certify that inpatient services furnished can reasonably be expected to improve the patient's condition.   Sharma Covert, MD 01/13/2018, 8:48 AM

## 2018-01-13 NOTE — Progress Notes (Signed)
Adult Psychoeducational Group Note  Date:  01/13/2018 Time:  9:52 PM  Group Topic/Focus:  Wrap-Up Group:   The focus of this group is to help patients review their daily goal of treatment and discuss progress on daily workbooks.  Participation Level:  Active  Participation Quality:  Appropriate  Affect:  Appropriate  Cognitive:  Alert and Oriented  Insight: Appropriate  Engagement in Group:  Developing/Improving  Modes of Intervention:  Exploration and Support  Additional Comments:  Pt verbalized that he had a good day. Pt verbalized that he hopes tomorrow is better. Pt verbalized that something positive is that he was able to meet new people.   Jeliyah Middlebrooks, Patrick North 01/13/2018, 9:52 PM
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
## 2018-01-13 NOTE — Tx Team (Addendum)
Interdisciplinary Treatment and Diagnostic Plan Update  01/13/2018 Time of Session: [place] MRN: [id number]  Principal Diagnosis: <principal problem not specified>  Secondary Diagnoses: Active Problems:   MDD (major depressive disorder), single episode, severe , no psychosis ([place])   Current Medications:  Current Facility-Administered Medications  Medication Dose Route Frequency Provider Last Rate Last Dose  . acetaminophen (TYLENOL) tablet 650 mg  650 mg Oral Q4H PRN Rankin, Shuvon B, NP      . aspirin EC tablet 325 mg  325 mg Oral Daily Rankin, Shuvon B, NP   325 mg at 01/13/18 0813  . atorvastatin (LIPITOR) tablet 40 mg  40 mg Oral Daily Rankin, Shuvon B, NP   40 mg at 01/13/18 0813  . carvedilol (COREG) tablet 3.125 mg  3.125 mg Oral BID WC Rankin, Shuvon B, NP   3.125 mg at 01/13/18 0813  . famotidine (PEPCID) tablet 10 mg  10 mg Oral Daily Rankin, Shuvon B, NP   10 mg at 01/13/18 0812  . gabapentin (NEURONTIN) capsule 100 mg  100 mg Oral BID Rankin, Shuvon B, NP   100 mg at 01/13/18 6568  . niacin tablet 250 mg  250 mg Oral Q breakfast Rankin, Shuvon B, NP   250 mg at 01/13/18 1314  . [START ON 01/14/2018] sertraline (ZOLOFT) tablet 25 mg  25 mg Oral Daily Sharma Covert, MD      . tamsulosin [facility]) capsule 0.4 mg  0.4 mg Oral Daily Rankin, Shuvon B, NP   0.4 mg at 01/13/18 1275   PTA Medications: Medications Prior to Admission  Medication Sig Dispense Refill Last Dose  . aspirin EC 325 MG EC tablet Take 1 tablet (325 mg total) by mouth daily. 30 tablet 0 01/11/2018 at Unknown time  . atorvastatin (LIPITOR) 40 MG tablet TAKE 1 TABLET EVERY DAY 90 tablet 3 01/11/2018 at Unknown time  . carvedilol (COREG) 3.125 MG tablet Take 3.125 mg by mouth 2 (two) times daily with a meal.   01/11/2018 at 0800  . gabapentin (NEURONTIN) 100 MG capsule TAKE 1 CAPSULE TWICE DAILY 180 capsule 3 01/11/2018 at Unknown time  . niacin 250 MG tablet Take 250 mg by mouth daily with  breakfast.   01/11/2018 at Unknown time  . nitroGLYCERIN (NITROSTAT) 0.4 MG SL tablet Place 0.4 mg under the tongue every 5 (five) minutes as needed for chest pain. May take up to 3 doses.   01/11/2018 at Unknown time  . ranitidine (ZANTAC) 150 MG tablet Take 1 tablet (150 mg total) by mouth daily. 90 tablet 3 01/11/2018 at Unknown time  . sertraline (ZOLOFT) 50 MG tablet TAKE 1 TABLET EVERY DAY 90 tablet 2 01/11/2018 at Unknown time  . tamsulosin (FLOMAX) 0.4 MG CAPS capsule TAKE 1 CAPSULE EVERY DAY (Patient not taking: Reported on 12/14/2017) 90 capsule 3 Not Taking at Unknown time  . tamsulosin (FLOMAX) 0.4 MG CAPS capsule Take 1 capsule (0.4 mg total) by mouth daily. 16 capsule 0 01/11/2018 at Unknown time  . vitamin B-12 (CYANOCOBALAMIN) 1000 MCG tablet Take 1,000 mcg by mouth daily.   01/11/2018 at Unknown time    Patient Stressors: Health problems Marital or family conflict  Patient Strengths: Ability for insight Motivation for treatment/growth Supportive family/friends  Treatment Modalities: Medication Management, Group therapy, Case management,  1 to 1 session with clinician, Psychoeducation, Recreational therapy.   Physician Treatment Plan for Primary Diagnosis: <principal problem not specified> Long Term Goal(s): Improvement in symptoms so as ready for discharge  Improvement in symptoms so as ready for discharge   Short Term Goals: Ability to identify changes in lifestyle to reduce recurrence of condition will improve Ability to verbalize feelings will improve Ability to disclose and discuss suicidal ideas Ability to demonstrate self-control will improve Ability to identify and develop effective coping behaviors will improve Ability to maintain clinical measurements within normal limits will improve Ability to identify changes in lifestyle to reduce recurrence of condition will improve Ability to verbalize feelings will improve Ability to disclose and discuss suicidal  ideas Ability to demonstrate self-control will improve Ability to identify and develop effective coping behaviors will improve Ability to maintain clinical measurements within normal limits will improve  Medication Management: Evaluate patient's response, side effects, and tolerance of medication regimen.  Therapeutic Interventions: 1 to 1 sessions, Unit Group sessions and Medication administration.  Evaluation of Outcomes: Not Met  Physician Treatment Plan for Secondary Diagnosis: Active Problems:   MDD (major depressive disorder), single episode, severe , no psychosis ([place])  Long Term Goal(s): Improvement in symptoms so as ready for discharge Improvement in symptoms so as ready for discharge   Short Term Goals: Ability to identify changes in lifestyle to reduce recurrence of condition will improve Ability to verbalize feelings will improve Ability to disclose and discuss suicidal ideas Ability to demonstrate self-control will improve Ability to identify and develop effective coping behaviors will improve Ability to maintain clinical measurements within normal limits will improve Ability to identify changes in lifestyle to reduce recurrence of condition will improve Ability to verbalize feelings will improve Ability to disclose and discuss suicidal ideas Ability to demonstrate self-control will improve Ability to identify and develop effective coping behaviors will improve Ability to maintain clinical measurements within normal limits will improve     Medication Management: Evaluate patient's response, side effects, and tolerance of medication regimen.  Therapeutic Interventions: 1 to 1 sessions, Unit Group sessions and Medication administration.  Evaluation of Outcomes: Not Met   RN Treatment Plan for Primary Diagnosis: <principal problem not specified> Long Term Goal(s): Knowledge of disease and therapeutic regimen to maintain health will improve  Short Term Goals: Ability  to identify and develop effective coping behaviors will improve and Compliance with prescribed medications will improve  Medication Management: RN will administer medications as ordered by provider, will assess and evaluate patient's response and provide education to patient for prescribed medication. RN will report any adverse and/or side effects to prescribing provider.  Therapeutic Interventions: 1 on 1 counseling sessions, Psychoeducation, Medication administration, Evaluate responses to treatment, Monitor vital signs and CBGs as ordered, Perform/monitor CIWA, COWS, AIMS and Fall Risk screenings as ordered, Perform wound care treatments as ordered.  Evaluation of Outcomes: Not Met   LCSW Treatment Plan for Primary Diagnosis: <principal problem not specified> Long Term Goal(s): Safe transition to appropriate next level of care at discharge, Engage patient in therapeutic group addressing interpersonal concerns.  Short Term Goals: Engage patient in aftercare planning with referrals and resources, Increase social support and Increase skills for wellness and recovery  Therapeutic Interventions: Assess for all discharge needs, 1 to 1 time with Social worker, Explore available resources and support systems, Assess for adequacy in community support network, Educate family and significant other(s) on suicide prevention, Complete Psychosocial Assessment, Interpersonal group therapy.  Evaluation of Outcomes: Not Met   Progress in Treatment: Attending groups: Yes. Participating in groups: Yes. Taking medication as prescribed: Yes. Toleration medication: Yes. Family/Significant other contact made: No, will contact:  when given permission Patient  understands diagnosis: Yes. Discussing patient identified problems/goals with staff: Yes. Medical problems stabilized or resolved: Yes. Denies suicidal/homicidal ideation: Yes. Issues/concerns per patient self-inventory: No. Other: none  New problem(s)  identified: No, Describe:  none  New Short Term/Long Term Goal(s):  Patient Goals:  "whatever needs to be done."  Discharge Plan or Barriers:   Reason for Continuation of Hospitalization: Depression Medication stabilization  Estimated Length of Stay: 3-5 days. Attendees: Patient: Donald [name] 01/13/2018   Physician: Dr Mallie Darting 01/13/2018   Nursing: Marnee Guarneri, RN 01/13/2018   RN Care Manager: 01/13/2018   Social Worker: Lurline Idol, LCSW 01/13/2018   Recreational Therapist:  01/13/2018   Other:  01/13/2018   Other:  01/13/2018  Other: 01/13/2018         Scribe for Treatment Team: Joanne Chars, [place] 01/13/2018 2:43 PM

## 2018-01-14 NOTE — Progress Notes (Signed)
Patient ID: Donald [name], male   DOB: Dec 18, 1955, 62 y.o.   MRN: [id number]  Nursing Progress Note [id number]  Data: Patient presents with flat affect and depressed mood. Patient is pleasant during interaction. Patient asked to speak to writer 1:1 reporting, "I've never done anything like this before, but my girlfriend of 20 years left me and I just got so sad. Who's going to want a 62 year old man? I miss her so much". Patient complaint with scheduled medications. Patient denies pain/physical complaints. Patient completed self-inventory sheet and rates depression, hopelessness, and anxiety 1,0,0 respectively. Patient rates their sleep and appetite as good/good respectively. Patient states goal for today is to "go home, talk to the doctor". Patient is seen attending groups and visible in the milieu. Patient utilizing walker when he ambulates. Patient currently denies any SI/HI/AVH.   Action: Patient educated about and provided medication per provider's orders. Patient safety maintained with q15 min safety checks and frequent rounding. High fall risk precautions in place. Emotional support given. 1:1 interaction and active listening provided. Patient encouraged to attend meals and groups. Patient encouraged to work on treatment plan and goals. Labs, vital signs and patient behavior monitored throughout shift.   Response: Patient agrees to come to staff if any thoughts of SI/HI develop or if patient develops intention of acting on thoughts. Patient remains safe on the unit at this time. Patient is interacting with peers appropriately on the unit. Will continue to support and monitor.
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
## 2018-01-14 NOTE — Progress Notes (Signed)
Pt did not attend goals and orientation group this morning  

## 2018-01-15 LAB — COMPREHENSIVE METABOLIC PANEL
ALT: 17 U/L (ref 0–44)
AST: 17 U/L (ref 15–41)
Albumin: 3.6 g/dL (ref 3.5–5.0)
Alkaline Phosphatase: 29 U/L — ABNORMAL LOW (ref 38–126)
Anion gap: 10 (ref 5–15)
BILIRUBIN TOTAL: 0.2 mg/dL — AB (ref 0.3–1.2)
BUN: 26 mg/dL — AB (ref 8–23)
CHLORIDE: 106 mmol/L (ref 98–111)
CO2: 26 mmol/L (ref 22–32)
CREATININE: 1.48 mg/dL — AB (ref 0.61–1.24)
Calcium: 9.5 mg/dL (ref 8.9–10.3)
GFR, EST AFRICAN AMERICAN: 57 mL/min — AB (ref >60)
GFR, EST NON AFRICAN AMERICAN: 49 mL/min — AB (ref >60)
Glucose, Bld: 94 mg/dL (ref 70–99)
Potassium: 4.3 mmol/L (ref 3.5–5.1)
Sodium: 142 mmol/L (ref 135–145)
TOTAL PROTEIN: 6.9 g/dL (ref 6.5–8.1)

## 2018-01-15 MED ORDER — TRAZODONE HCL 50 MG PO TABS: 50.0000 mg | ORAL_TABLET | Freq: Every evening | ORAL | 0 refills | Status: DC | PRN

## 2018-01-15 MED ORDER — SERTRALINE HCL 25 MG PO TABS: 25.0000 mg | ORAL_TABLET | Freq: Every day | ORAL | 0 refills | Status: DC

## 2018-01-15 NOTE — Plan of Care (Signed)
Patient verbalizes readiness for discharge. Follow up plan explained, AVS, Transition record and SRA given. Prescriptions and teaching provided. Belongings returned and signed for. Suicide safety plan completed and signed. Patient verbalizes understanding. Patient denies SI/HI and assures this Probation officer he will seek assistance should that change. Patient discharged to lobby where daughter was waiting.  Problem: Education: Goal: Knowledge of [place] [facility] Education information/materials will improve Outcome: Adequate for Discharge Goal: Emotional status will improve Outcome: Adequate for Discharge Goal: Mental status will improve Outcome: Adequate for Discharge Goal: Verbalization of understanding the information provided will improve Outcome: Adequate for Discharge   Problem: Activity: Goal: Interest or engagement in activities will improve Outcome: Adequate for Discharge Goal: Sleeping patterns will improve Outcome: Adequate for Discharge   Problem: Coping: Goal: Ability to verbalize frustrations and anger appropriately will improve Outcome: Adequate for Discharge Goal: Ability to demonstrate self-control will improve Outcome: Adequate for Discharge   Problem: Health Behavior/Discharge Planning: Goal: Identification of resources available to assist in meeting health care needs will improve Outcome: Adequate for Discharge Goal: Compliance with treatment plan for underlying cause of condition will improve Outcome: Adequate for Discharge   Problem: Physical Regulation: Goal: Ability to maintain clinical measurements within normal limits will improve Outcome: Adequate for Discharge   Problem: Safety: Goal: Periods of time without injury will increase Outcome: Adequate for Discharge   Problem: Self-Concept: Goal: Ability to disclose and discuss suicidal ideas will improve Outcome: Adequate for Discharge Goal: Will verbalize positive feelings about self Outcome: Adequate for  Discharge

## 2018-01-15 NOTE — Progress Notes (Signed)
Recreation Therapy Notes  Date: 7.19.19 Time: 0930 Location: [address] Dayroom  Group Topic: Stress Management  Goal Area(s) Addresses:  Patient will verbalize importance of using healthy stress management.  Patient will identify positive emotions associated with healthy stress management.   Intervention: Stress Management  Activity :  [employer].  LRT introduced the stress management technique of meditation.  LRT played a played a meditation that allowed patients to take on the characteristics of a mountain.  Patients were to listen and follow along as the meditation.  Education:  Stress Management, Discharge Planning.   Education Outcome: Acknowledges edcuation/In group clarification offered/Needs additional education  Clinical Observations/Feedback: Pt did not attend group.      Victorino Sparrow, LRT/CTRS         Victorino Sparrow A 01/15/2018 11:33 AM

## 2018-01-15 NOTE — Progress Notes (Signed)
  [facility] Adult Case Management Discharge Plan :  Will you be returning to the same living situation after discharge:  No.Will be staying with sister. At discharge, do you have transportation home?: Yes,  sister Do you have the ability to pay for your medications: Yes,  [employer] medicare  Release of information consent forms completed and in the chart;  Patient's signature needed at discharge.  Patient to Follow up at: Follow-up [place] in 3 day(s).   Specialty:  Professional Counselor Why:  Please attend a walk in appt between 8:30am-2:30pm, Monday-Friday.  Please request therapy and medication management services.   Contact information: [facility]  [phone]           Next level of care provider has access to [place] and Suicide Prevention discussed: Yes,  with sister  Have you used any form of tobacco in the last 30 days? (Cigarettes, Smokeless Tobacco, Cigars, and/or Pipes): No  Has patient been referred to the Quitline?: N/A patient is not a smoker  Patient has been referred for addiction treatment: N/A  Joanne Chars, LCSW 01/15/2018, 10:32 AM
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
## 2018-01-15 NOTE — BHH Group Notes (Signed)
  [place] LCSW Group Therapy Note  Date/Time: 01/15/18, 1315  Type of Therapy/Topic:  Group Therapy:  Emotion Regulation  Participation Level:  None   Mood:  Description of Group:    The purpose of this group is to assist patients in learning to regulate negative emotions and experience positive emotions. Patients will be guided to discuss ways in which they have been vulnerable to their negative emotions. These vulnerabilities will be juxtaposed with experiences of positive emotions or situations, and patients challenged to use positive emotions to combat negative ones. Special emphasis will be placed on coping with negative emotions in conflict situations, and patients will process healthy conflict resolution skills.  Therapeutic Goals: 1. Patient will identify two positive emotions or experiences to reflect on in order to balance out negative emotions:  2. Patient will label two or more emotions that they find the most difficult to experience:  3. Patient will be able to demonstrate positive conflict resolution skills through discussion or role plays:   Summary of Patient Progress:Pt shared that sadness is an emotion difficult for him to experience.  Pt did not participate in group discussion but was attentive.       Therapeutic Modalities:   Cognitive Behavioral Therapy Feelings Identification Dialectical Behavioral Therapy  Lurline Idol, LCSW

## 2018-01-15 NOTE — BHH Suicide Risk Assessment (Signed)
[facility] Discharge Suicide Risk Assessment   Principal Problem: <principal problem not specified> Discharge Diagnoses:  Patient Active Problem List   Diagnosis Date Noted  . Vascular dementia with behavior disturbance [F01.51]   . MDD (major depressive disorder), single episode, severe , no psychosis ([place]) [F32.2] 01/12/2018  . Depression [F32.9]   . BPH (benign prostatic hyperplasia) [N40.0] 10/06/2017  . Constipation [K59.00] 10/06/2017  . Stroke ([place]) [I63.9] 06/11/2017  . Cerebral ventriculomegaly [G93.89]   . Altered mental status [R41.82] 06/10/2017  . Depressed mood [F32.9] 06/06/2017  . Abdominal pain [R10.9] 05/23/2017  . Left sided abdominal pain [R10.9] 08/06/2016  . Headache [R51] 08/06/2016  . History of CVA (cerebrovascular accident) [Z86.73] 08/06/2016  . Diverticulosis [K57.90] 11/23/2014  . Coronary artery disease [I25.10] 11/23/2014  . Cerebral aneurysm [I67.1] 11/23/2014  . Clostridium difficile colitis [A04.72]     Total Time spent with patient: 30 minutes  Musculoskeletal: Strength & Muscle Tone: decreased Gait & Station: shuffle Patient leans: N/A  Psychiatric Specialty Exam: Review of Systems  All other systems reviewed and are negative.   Blood pressure (!) 105/53, pulse 68, temperature 97.8 F (36.6 C), temperature source Oral, resp. rate 16, SpO2 99 %.There is no height or weight on file to calculate BMI.  General Appearance: Casual  Eye Contact::  Good  Speech:  Normal Rate409  Volume:  Normal  Mood:  Euthymic  Affect:  Congruent  Thought Process:  Coherent  Orientation:  Full (Time, Place, and Person)  Thought Content:  Logical  Suicidal Thoughts:  No  Homicidal Thoughts:  No  Memory:  Immediate;   Fair Recent;   Fair Remote;   Fair  Judgement:  Intact  Insight:  Fair  Psychomotor Activity:  Normal  Concentration:  Fair  Recall:  Poor  Fund of Knowledge:Fair  Language: Fair  Akathisia:  Negative  Handed:  Right  AIMS (if indicated):      Assets:  Desire for Improvement Housing Resilience Social Support  Sleep:  Number of Hours: 5.25  Cognition: WNL  ADL's:  Intact   Mental Status Per Nursing Assessment::   On Admission:  NA  Demographic Factors:  Male, Age 62 or older, Divorced or widowed, Caucasian and Unemployed  Loss Factors: NA  Historical Factors: Impulsivity  Risk Reduction Factors:   Sense of responsibility to family, Living with another person, especially a relative and Positive social support  Continued Clinical Symptoms:  Depression:   Impulsivity More than one psychiatric diagnosis  Cognitive Features That Contribute To Risk:  None    Suicide Risk:  Minimal: No identifiable suicidal ideation.  Patients presenting with no risk factors but with morbid ruminations; may be classified as minimal risk based on the severity of the depressive symptoms    Plan Of Care/Follow-up recommendations:  Activity:  ad lib  Sharma Covert, MD 01/15/2018, 7:46 AM

## 2018-01-15 NOTE — Plan of Care (Signed)
  Problem: Activity: Goal: Interest or engagement in activities will improve Outcome: Progressing   Problem: Activity: Goal: Sleeping patterns will improve Outcome: Progressing   Problem: Safety: Goal: Periods of time without injury will increase Outcome: Progressing   No episodes of self harm on the unit.  He attends groups and unit activities.  He was noted sleeping well tonight.  We will continue to monitor the progress towards his goals.
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
## 2018-01-16 ENCOUNTER — Observation Stay (HOSPITAL_COMMUNITY)
Admission: EM | Admit: 2018-01-16 | Discharge: 2018-01-19 | Disposition: A | Discharge status: Home / Self Care | Payer: Medicare HMO | Attending: Family Medicine | Admitting: Family Medicine

## 2018-01-16 ENCOUNTER — Other Ambulatory Visit: Payer: Self-pay

## 2018-01-16 ENCOUNTER — Emergency Department (HOSPITAL_COMMUNITY): Payer: Medicare HMO

## 2018-01-16 ENCOUNTER — Encounter (HOSPITAL_COMMUNITY): Payer: Self-pay | Admitting: Emergency Medicine

## 2018-01-16 DIAGNOSIS — Z882 Allergy status to sulfonamides status: Secondary | ICD-10-CM | POA: Insufficient documentation

## 2018-01-16 DIAGNOSIS — I69328 Other speech and language deficits following cerebral infarction: Secondary | ICD-10-CM | POA: Insufficient documentation

## 2018-01-16 DIAGNOSIS — Z888 Allergy status to other drugs, medicaments and biological substances status: Secondary | ICD-10-CM | POA: Diagnosis not present

## 2018-01-16 DIAGNOSIS — F329 Major depressive disorder, single episode, unspecified: Secondary | ICD-10-CM | POA: Diagnosis not present

## 2018-01-16 DIAGNOSIS — I69331 Monoplegia of upper limb following cerebral infarction affecting right dominant side: Secondary | ICD-10-CM | POA: Diagnosis not present

## 2018-01-16 DIAGNOSIS — I509 Heart failure, unspecified: Secondary | ICD-10-CM | POA: Insufficient documentation

## 2018-01-16 DIAGNOSIS — I2510 Atherosclerotic heart disease of native coronary artery without angina pectoris: Secondary | ICD-10-CM | POA: Diagnosis not present

## 2018-01-16 DIAGNOSIS — Z79899 Other long term (current) drug therapy: Secondary | ICD-10-CM | POA: Insufficient documentation

## 2018-01-16 DIAGNOSIS — Z87891 Personal history of nicotine dependence: Secondary | ICD-10-CM | POA: Insufficient documentation

## 2018-01-16 DIAGNOSIS — R0789 Other chest pain: Secondary | ICD-10-CM | POA: Diagnosis not present

## 2018-01-16 DIAGNOSIS — E039 Hypothyroidism, unspecified: Secondary | ICD-10-CM | POA: Diagnosis not present

## 2018-01-16 DIAGNOSIS — N179 Acute kidney failure, unspecified: Secondary | ICD-10-CM | POA: Diagnosis not present

## 2018-01-16 DIAGNOSIS — Z79891 Long term (current) use of opiate analgesic: Secondary | ICD-10-CM | POA: Diagnosis not present

## 2018-01-16 DIAGNOSIS — F0151 Vascular dementia with behavioral disturbance: Secondary | ICD-10-CM | POA: Insufficient documentation

## 2018-01-16 DIAGNOSIS — R079 Chest pain, unspecified: Secondary | ICD-10-CM | POA: Diagnosis not present

## 2018-01-16 DIAGNOSIS — N2889 Other specified disorders of kidney and ureter: Secondary | ICD-10-CM | POA: Diagnosis not present

## 2018-01-16 DIAGNOSIS — R109 Unspecified abdominal pain: Secondary | ICD-10-CM | POA: Diagnosis not present

## 2018-01-16 DIAGNOSIS — N400 Enlarged prostate without lower urinary tract symptoms: Secondary | ICD-10-CM | POA: Insufficient documentation

## 2018-01-16 DIAGNOSIS — Z915 Personal history of self-harm: Secondary | ICD-10-CM | POA: Insufficient documentation

## 2018-01-16 DIAGNOSIS — K219 Gastro-esophageal reflux disease without esophagitis: Secondary | ICD-10-CM | POA: Insufficient documentation

## 2018-01-16 DIAGNOSIS — F01518 Vascular dementia, unspecified severity, with other behavioral disturbance: Secondary | ICD-10-CM | POA: Diagnosis present

## 2018-01-16 DIAGNOSIS — R9431 Abnormal electrocardiogram [ECG] [EKG]: Secondary | ICD-10-CM | POA: Diagnosis not present

## 2018-01-16 DIAGNOSIS — Z9049 Acquired absence of other specified parts of digestive tract: Secondary | ICD-10-CM | POA: Insufficient documentation

## 2018-01-16 DIAGNOSIS — Z880 Allergy status to penicillin: Secondary | ICD-10-CM | POA: Diagnosis not present

## 2018-01-16 DIAGNOSIS — R1032 Left lower quadrant pain: Secondary | ICD-10-CM | POA: Diagnosis not present

## 2018-01-16 DIAGNOSIS — Z8673 Personal history of transient ischemic attack (TIA), and cerebral infarction without residual deficits: Secondary | ICD-10-CM

## 2018-01-16 DIAGNOSIS — R1031 Right lower quadrant pain: Secondary | ICD-10-CM | POA: Diagnosis not present

## 2018-01-16 DIAGNOSIS — N289 Disorder of kidney and ureter, unspecified: Secondary | ICD-10-CM | POA: Diagnosis not present

## 2018-01-16 DIAGNOSIS — I6381 Other cerebral infarction due to occlusion or stenosis of small artery: Secondary | ICD-10-CM

## 2018-01-16 DIAGNOSIS — F322 Major depressive disorder, single episode, severe without psychotic features: Secondary | ICD-10-CM | POA: Diagnosis present

## 2018-01-16 HISTORY — DX: Suicide attempt, initial encounter: T14.91XA

## 2018-01-16 HISTORY — DX: Depression, unspecified: F32.A

## 2018-01-16 HISTORY — DX: Major depressive disorder, single episode, unspecified: F32.9

## 2018-01-16 HISTORY — DX: Diverticulosis of intestine, part unspecified, without perforation or abscess without bleeding: K57.90

## 2018-01-16 LAB — I-STAT TROPONIN, ED
Troponin i, poc: 0.02 ng/mL (ref 0.00–0.08)
Troponin i, poc: 0.07 ng/mL (ref 0.00–0.08)

## 2018-01-16 LAB — COMPREHENSIVE METABOLIC PANEL
ALK PHOS: 20 U/L — AB (ref 38–126)
ALT: 17 U/L (ref 0–44)
AST: 17 U/L (ref 15–41)
Albumin: 3.4 g/dL — ABNORMAL LOW (ref 3.5–5.0)
Anion gap: 10 (ref 5–15)
BUN: 17 mg/dL (ref 8–23)
CALCIUM: 8.9 mg/dL (ref 8.9–10.3)
CO2: 27 mmol/L (ref 22–32)
CREATININE: 1.36 mg/dL — AB (ref 0.61–1.24)
Chloride: 106 mmol/L (ref 98–111)
GFR, EST NON AFRICAN AMERICAN: 54 mL/min — AB (ref >60)
Glucose, Bld: 94 mg/dL (ref 70–99)
Potassium: 4.3 mmol/L (ref 3.5–5.1)
Sodium: 143 mmol/L (ref 135–145)
Total Bilirubin: 0.5 mg/dL (ref 0.3–1.2)
Total Protein: 6.4 g/dL — ABNORMAL LOW (ref 6.5–8.1)

## 2018-01-16 LAB — CBC
HCT: 36.8 % — ABNORMAL LOW (ref 39.0–52.0)
Hemoglobin: 11.6 g/dL — ABNORMAL LOW (ref 13.0–17.0)
MCH: 28.2 pg (ref 26.0–34.0)
MCHC: 31.5 g/dL (ref 30.0–36.0)
MCV: 89.5 fL (ref 78.0–100.0)
PLATELETS: 264 10*3/uL (ref 150–400)
RBC: 4.11 MIL/uL — AB (ref 4.22–5.81)
RDW: 13.8 % (ref 11.5–15.5)
WBC: 7.2 10*3/uL (ref 4.0–10.5)

## 2018-01-16 LAB — LIPASE, BLOOD: Lipase: 44 U/L (ref 11–51)

## 2018-01-16 MED ORDER — IOHEXOL 300 MG/ML  SOLN
100.0000 mL | Freq: Once | INTRAMUSCULAR | Status: AC | PRN
  Administered 2018-01-16: 100 mL via INTRAVENOUS
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
## 2018-01-16 NOTE — ED Notes (Addendum)
Patient transported to XR. 

## 2018-01-16 NOTE — ED Notes (Signed)
Patient back from XR.

## 2018-01-16 NOTE — ED Notes (Signed)
Patient aware urine sample is needed for UA. Urinal left at bedside.

## 2018-01-17 ENCOUNTER — Encounter (HOSPITAL_COMMUNITY): Payer: Self-pay | Admitting: Family Medicine

## 2018-01-17 ENCOUNTER — Other Ambulatory Visit: Payer: Self-pay

## 2018-01-17 DIAGNOSIS — I25110 Atherosclerotic heart disease of native coronary artery with unstable angina pectoris: Secondary | ICD-10-CM | POA: Diagnosis not present

## 2018-01-17 DIAGNOSIS — R079 Chest pain, unspecified: Secondary | ICD-10-CM

## 2018-01-17 DIAGNOSIS — R0789 Other chest pain: Principal | ICD-10-CM

## 2018-01-17 DIAGNOSIS — Z8673 Personal history of transient ischemic attack (TIA), and cerebral infarction without residual deficits: Secondary | ICD-10-CM | POA: Diagnosis not present

## 2018-01-17 DIAGNOSIS — I2510 Atherosclerotic heart disease of native coronary artery without angina pectoris: Secondary | ICD-10-CM | POA: Diagnosis not present

## 2018-01-17 DIAGNOSIS — I6381 Other cerebral infarction due to occlusion or stenosis of small artery: Secondary | ICD-10-CM

## 2018-01-17 DIAGNOSIS — K219 Gastro-esophageal reflux disease without esophagitis: Secondary | ICD-10-CM | POA: Diagnosis not present

## 2018-01-17 DIAGNOSIS — I509 Heart failure, unspecified: Secondary | ICD-10-CM | POA: Diagnosis not present

## 2018-01-17 DIAGNOSIS — F322 Major depressive disorder, single episode, severe without psychotic features: Secondary | ICD-10-CM

## 2018-01-17 DIAGNOSIS — E039 Hypothyroidism, unspecified: Secondary | ICD-10-CM | POA: Diagnosis not present

## 2018-01-17 DIAGNOSIS — F0151 Vascular dementia with behavioral disturbance: Secondary | ICD-10-CM | POA: Diagnosis not present

## 2018-01-17 DIAGNOSIS — N179 Acute kidney failure, unspecified: Secondary | ICD-10-CM | POA: Diagnosis not present

## 2018-01-17 DIAGNOSIS — F329 Major depressive disorder, single episode, unspecified: Secondary | ICD-10-CM | POA: Diagnosis not present

## 2018-01-17 DIAGNOSIS — I69328 Other speech and language deficits following cerebral infarction: Secondary | ICD-10-CM | POA: Diagnosis not present

## 2018-01-17 LAB — BASIC METABOLIC PANEL
Anion gap: 10 (ref 5–15)
BUN: 16 mg/dL (ref 8–23)
CALCIUM: 9 mg/dL (ref 8.9–10.3)
CO2: 25 mmol/L (ref 22–32)
CREATININE: 1.32 mg/dL — AB (ref 0.61–1.24)
Chloride: 106 mmol/L (ref 98–111)
GFR calc Af Amer: >60 mL/min (ref >60)
GFR calc non Af Amer: 56 mL/min — ABNORMAL LOW (ref >60)
GLUCOSE: 84 mg/dL (ref 70–99)
Potassium: 4 mmol/L (ref 3.5–5.1)
Sodium: 141 mmol/L (ref 135–145)

## 2018-01-17 LAB — LIPID PANEL
Cholesterol: 85 mg/dL (ref 0–200)
HDL: 35 mg/dL — ABNORMAL LOW (ref >40)
LDL Cholesterol: 35 mg/dL (ref 0–99)
Total CHOL/HDL Ratio: 2.4 RATIO
Triglycerides: 76 mg/dL (ref <150)
VLDL: 15 mg/dL (ref 0–40)

## 2018-01-17 LAB — CBC
HCT: 36.3 % — ABNORMAL LOW (ref 39.0–52.0)
Hemoglobin: 11.5 g/dL — ABNORMAL LOW (ref 13.0–17.0)
MCH: 28.1 pg (ref 26.0–34.0)
MCHC: 31.7 g/dL (ref 30.0–36.0)
MCV: 88.8 fL (ref 78.0–100.0)
PLATELETS: 245 10*3/uL (ref 150–400)
RBC: 4.09 MIL/uL — ABNORMAL LOW (ref 4.22–5.81)
RDW: 13.6 % (ref 11.5–15.5)
WBC: 8.4 10*3/uL (ref 4.0–10.5)

## 2018-01-17 LAB — URINALYSIS, ROUTINE W REFLEX MICROSCOPIC
Bilirubin Urine: NEGATIVE
Glucose, UA: NEGATIVE mg/dL
HGB URINE DIPSTICK: NEGATIVE
Ketones, ur: NEGATIVE mg/dL
Leukocytes, UA: NEGATIVE
Nitrite: NEGATIVE
PROTEIN: NEGATIVE mg/dL
SPECIFIC GRAVITY, URINE: 1.021 (ref 1.005–1.030)
pH: 7 (ref 5.0–8.0)

## 2018-01-17 LAB — TROPONIN I: Troponin I: <0.03 ng/mL (ref <0.03)

## 2018-01-17 LAB — HEMOGLOBIN A1C
HEMOGLOBIN A1C: 5.7 % — AB (ref 4.8–5.6)
Mean Plasma Glucose: 116.89 mg/dL

## 2018-01-17 LAB — MRSA PCR SCREENING: MRSA BY PCR: NEGATIVE

## 2018-01-17 LAB — HIV ANTIBODY (ROUTINE TESTING W REFLEX): HIV SCREEN 4TH GENERATION: NONREACTIVE

## 2018-01-17 LAB — TSH: TSH: 1.511 u[IU]/mL (ref 0.350–4.500)

## 2018-01-17 MED ORDER — HEPARIN SODIUM (PORCINE) 5000 UNIT/ML IJ SOLN
5000.0000 [IU] | Freq: Three times a day (TID) | INTRAMUSCULAR | Status: DC
  Administered 2018-01-17 – 2018-01-19 (×7): 5000 [IU] via SUBCUTANEOUS
  Filled 2018-01-17 (×7): qty 1

## 2018-01-17 MED ORDER — NIACIN 250 MG PO TABS
250.0000 mg | ORAL_TABLET | Freq: Every day | ORAL | Status: DC
  Filled 2018-01-17 (×3): qty 1

## 2018-01-17 MED ORDER — NIACIN ER 250 MG PO CPCR
250.0000 mg | ORAL_CAPSULE | Freq: Every day | ORAL | Status: DC
  Administered 2018-01-18 – 2018-01-19 (×2): 250 mg via ORAL
  Filled 2018-01-17 (×2): qty 1

## 2018-01-17 MED ORDER — NITROGLYCERIN 0.4 MG SL SUBL: 0.4000 mg | SUBLINGUAL_TABLET | SUBLINGUAL | Status: DC | PRN

## 2018-01-17 MED ORDER — ASPIRIN 300 MG RE SUPP: 300.0000 mg | RECTAL | Status: AC

## 2018-01-17 MED ORDER — TAMSULOSIN HCL 0.4 MG PO CAPS
0.4000 mg | ORAL_CAPSULE | Freq: Every day | ORAL | Status: DC
  Administered 2018-01-17 – 2018-01-19 (×3): 0.4 mg via ORAL
  Filled 2018-01-17 (×3): qty 1

## 2018-01-17 MED ORDER — ASPIRIN 81 MG PO CHEW
324.0000 mg | CHEWABLE_TABLET | ORAL | Status: AC
  Administered 2018-01-17: 324 mg via ORAL
  Filled 2018-01-17: qty 4

## 2018-01-17 MED ORDER — ATORVASTATIN CALCIUM 40 MG PO TABS
40.0000 mg | ORAL_TABLET | Freq: Every day | ORAL | Status: DC
  Administered 2018-01-17 – 2018-01-19 (×3): 40 mg via ORAL
  Filled 2018-01-17 (×3): qty 1

## 2018-01-17 MED ORDER — TRAZODONE HCL 50 MG PO TABS
50.0000 mg | ORAL_TABLET | Freq: Every day | ORAL | Status: DC
  Administered 2018-01-17 – 2018-01-18 (×2): 50 mg via ORAL
  Filled 2018-01-17 (×2): qty 1

## 2018-01-17 MED ORDER — ACETAMINOPHEN 325 MG PO TABS: 650.0000 mg | ORAL_TABLET | ORAL | Status: DC | PRN

## 2018-01-17 MED ORDER — ONDANSETRON HCL 4 MG/2ML IJ SOLN: 4.0000 mg | Freq: Four times a day (QID) | INTRAMUSCULAR | Status: DC | PRN

## 2018-01-17 MED ORDER — SERTRALINE HCL 25 MG PO TABS
25.0000 mg | ORAL_TABLET | Freq: Every day | ORAL | Status: DC
Start: 2018-01-17 — End: 2018-01-19
  Administered 2018-01-17 – 2018-01-19 (×3): 25 mg via ORAL
  Filled 2018-01-17 (×3): qty 1

## 2018-01-17 MED ORDER — CARVEDILOL 3.125 MG PO TABS
3.1250 mg | ORAL_TABLET | Freq: Two times a day (BID) | ORAL | Status: DC
  Administered 2018-01-17 – 2018-01-19 (×5): 3.125 mg via ORAL
  Filled 2018-01-17 (×5): qty 1

## 2018-01-17 MED ORDER — FAMOTIDINE 20 MG PO TABS
10.0000 mg | ORAL_TABLET | Freq: Every day | ORAL | Status: DC
  Administered 2018-01-17 – 2018-01-19 (×3): 10 mg via ORAL
  Filled 2018-01-17 (×3): qty 1

## 2018-01-17 MED ORDER — ASPIRIN EC 81 MG PO TBEC
81.0000 mg | DELAYED_RELEASE_TABLET | Freq: Every day | ORAL | Status: DC
  Administered 2018-01-18 – 2018-01-19 (×2): 81 mg via ORAL
  Filled 2018-01-17 (×2): qty 1

## 2018-01-17 MED ORDER — GABAPENTIN 100 MG PO CAPS
100.0000 mg | ORAL_CAPSULE | Freq: Two times a day (BID) | ORAL | Status: DC
  Administered 2018-01-17 – 2018-01-19 (×5): 100 mg via ORAL
  Filled 2018-01-17 (×5): qty 1
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
## 2018-01-17 NOTE — Discharge Summary (Signed)
[facility] Discharge Summary  Patient name: Donald [name] Medical record number: [id number] Date of birth: 1956-06-27 Age: 62 y.o. Gender: male Date of Admission: 01/16/2018  Date of Discharge: 01/19/2018 Admitting Physician: Blane Ohara McDiarmid, MD  Primary Care Provider: [name], MD Consultants: cardiology  Indication for Hospitalization: chest pain  Discharge Diagnoses/Problem List:  Patient Active Problem List   Diagnosis Date Noted  . Chest pain of uncertain etiology 35/36/1443  . Left sided lacunar infarction ([place]) 01/17/2018  . Vascular dementia with behavior disturbance   . MDD (major depressive disorder), single episode, severe , no psychosis ([place]) 01/12/2018  . BPH (benign prostatic hyperplasia) 10/06/2017  . Constipation 10/06/2017  . History of stroke 06/11/2017  . Cerebral ventriculomegaly   . Altered mental status 06/10/2017  . Abdominal pain 05/23/2017  . Left sided abdominal pain 08/06/2016  . Coronary artery disease 11/23/2014  . Cerebral aneurysm 11/23/2014     Disposition: DC home  Discharge Condition: Stable  Discharge Exam:  General: Alert and cooperative and appears to be in no acute distress.  He was able to sit up in bed and move to the side of his bed this morning during exam. HEENT: Neck non-tender without lymphadenopathy, masses or thyromegaly Cardio: Normal A1 and S2, no S3 or S4.  Regular rate and rhythm.  Distant heart sounds. Pulm: Clear to auscultation bilaterally, no crackles, wheezing, or diminished breath sounds. Normal respiratory effort Abdomen: Bowel sounds normal. Abdomen soft and non-tender.  Extremities: No peripheral edema. Warm/ well perfused.  Strong radial and pedal pulses. Neuro: Cranial nerves grossly intact  Brief Hospital Course:  Mr. Meyerhoff 62 year old man presented with 1 day of chest pain.  He has a medical history significant for CAD, CHF, GERD, CVA in 2015, hypothyroidism.  Came  in with diffuse chest pressure was worked up for ACS rule out.  Chest x-ray, EKG and troponins were unremarkable.  Patient was monitored on telemetry overnight with no acute events.  In the morning of 7/21 he is found to be without chest pain.  He was kept inpatient in order to perform gadolinium tomorrow to assess for viable tissue within the distribution of the LAD distal to his occlusion.  On 7/23 we were informed that he did not need this MRI study, the desired information was already available in a previous study.  On 7/23 he was discharged home.  During work-up CT abdomen revealed a 1.3 cm left renal lesion, possible neoplasm.  Issues for Follow Up:  1. Follow-up with cardiology regarding history of CAD/CHF.  2. Follow-up with PCP regarding renal mass suspicious for neoplasm. Per radiology: Abdomen CT without and with contrast would be appropriate if patient has contraindication to MRI or cannot cooperate with breath-holding. 3. Follow-up with PCP regarding A1c of 5.7.  Prediabetic range and may benefit from lifestyle modification.  Significant Procedures: none  Significant Labs and Imaging:  Recent Labs  Lab 01/17/18 0628 01/18/18 0243 01/19/18 0533  WBC 8.4 7.9 7.1  HGB 11.5* 11.1* 10.4*  HCT 36.3* 34.6* 32.4*  PLT 245 236 212   Recent Labs  Lab 01/15/18 0637 01/16/18 2028 01/17/18 0628 01/18/18 0243 01/19/18 0533  NA 142 143 141 145 140  K 4.3 4.3 4.0 4.3 4.3  CL 106 106 106 108 107  CO2 26 27 25 28 24   GLUCOSE 94 94 84 102* 98  BUN 26* 17 16 23  26*  CREATININE 1.48* 1.36* 1.32* 1.62* 1.40*  CALCIUM 9.5 8.9 9.0 8.9  8.4*  ALKPHOS 29* 20*  --  24*  --   AST 17 17  --  16  --   ALT 17 17  --  17  --   ALBUMIN 3.6 3.4*  --  3.2*  --     Results/Tests Pending at Time of Discharge: none   Discharge Medications:  Allergies as of 01/19/2018      Reactions   Penicillins Shortness Of Breath, Rash   Has patient had a PCN reaction causing immediate rash,  facial/tongue/throat swelling, SOB or lightheadedness with hypotension: Yes Has patient had a PCN reaction causing severe rash involving mucus membranes or skin necrosis: No Has patient had a PCN reaction that required hospitalization: No Has patient had a PCN reaction occurring within the last 10 years: Yes If all of the above answers are "NO", then may proceed with Cephalosporin use.   Sulfa Antibiotics Hives, Shortness Of Breath, Rash   Tramadol Nausea And Vomiting      Medication List    TAKE these medications   atorvastatin 40 MG tablet Commonly known as:  LIPITOR TAKE 1 TABLET EVERY DAY   carvedilol 3.125 MG tablet Commonly known as:  COREG Take 3.125 mg by mouth 2 (two) times daily with a meal.   gabapentin 100 MG capsule Commonly known as:  NEURONTIN TAKE 1 CAPSULE TWICE DAILY   niacin 250 MG tablet Take 250 mg by mouth daily with breakfast.   nitroGLYCERIN 0.4 MG SL tablet Commonly known as:  NITROSTAT Place 0.4 mg under the tongue every 5 (five) minutes as needed for chest pain. May take up to 3 doses.   ranitidine 150 MG tablet Commonly known as:  ZANTAC Take 1 tablet (150 mg total) by mouth daily.   sertraline 25 MG tablet Commonly known as:  ZOLOFT Take 1 tablet (25 mg total) by mouth daily.   tamsulosin 0.4 MG Caps capsule Commonly known as:  FLOMAX TAKE 1 CAPSULE EVERY DAY   traZODone 50 MG tablet Commonly known as:  DESYREL Take 1 tablet (50 mg total) by mouth at bedtime as needed for sleep.   vitamin B-12 1000 MCG tablet Commonly known as:  CYANOCOBALAMIN Take 1,000 mcg by mouth daily.       Discharge Instructions: Please refer to Patient Instructions section of EMR for full details.  Patient was counseled important signs and symptoms that should prompt return to medical care, changes in medications, dietary instructions, activity restrictions, and follow up appointments.   Follow-Up Appointments:   Matilde Haymaker, MD 01/19/2018, 6:46  PM PGY-1, [place]

## 2018-01-17 NOTE — ED Notes (Signed)
Attempted report x1, left call back number with unit secretary.

## 2018-01-17 NOTE — Progress Notes (Signed)
Family Medicine Teaching Service Daily Progress Note Intern Pager: [phone]  Patient name: Donald [name] Medical record number: [id number] Date of birth: 1956/06/23 Age: 62 y.o. Gender: male  Primary Care Provider: [name], MD Consultants: none Code Status: Full  Pt Overview and Major Events to Date:  7/21 - admitted for chest pain  Assessment and Plan:  Chest pain w h/o CHF, CAD - ACS r/o:  Unclear etiology at this time. H/o CAD and CHF. Last seen by Cardiologist, Dr. Sallyanne [name] on 10/16/17 at which time patient recommended for gadolinium MRI to determine if possible bypass surgery is needed. Patient with scheduled cardiology appointment on 01/18/18 but appears to be canceled, patient denies canceling appointment. Echo from 07/29/17 showing LVEF 40-45%. Cardiac cath in 08/2007 with 40% left main disease, 100% mid LAD, 90% ostial LCX dz with small distal vessel. In ED istat troponin trended at 0.02>0.07 with EKG showing no significant changes from previous. CXR showing no active CP disease. Weight on admission 141 lbs, which appears down from previous admissions. No orthopnea, PND, or edema so unlikely CHF exacerbation. Unlikely pneumonia given no SOB, fever, or new cough. Wells score 0 so unlikely PE. Cannot rule out GERD as patient has h/o reflux, however less likely as not related to meals. troponins 0.02 >0.07> 0.03.  EKGs appear unremarkable change from baseline so.  A1c TSH and lipid panel are unremarkable.  Symmetry of her night was unremarkable. -continue home atorvastatin 40mg , carvedilol 3.125mg  bid, niacin 250mg  daily -can consider cardiology consult in AM as patient was scheduled for outpatient follow up on 7/22 -ASA 325mg  today, followed by ASA 81mg  daily -nitrostat PRN  Abdominal pain Reported by ED admitting PA, however patient denies. On physical exam patient reports diffuse tenderness to palpation of lower abdomen. CT abdomen showing 1.3cm left renal lesion,  possible renal neoplasm. Recommended for outpatient MRI w and w/out contrast as outpatient -recommended outpatient abdominal MRI with and without contrast   AKI Cr of 1.36, similar to baseline. Downtrending from Cr of 1.48 on 7/19. Baseline Cr per chart review ~1.2-1.3.  -monitor on daily BMP  H/o stroke in 2015 With subsequent deficits of slowed/slurred speech with difficulties swallowing and R UE weakness. 5/5 strength on exam. Speech only minimally slurred. Per last neurology note patient to continue ASA 325mg . Recently discontinued during behavioral health admission, unclear why. Will continue ASA 81 mg while inpatient  -ASA 81mg    FEN/GI: Heart healty PPx: heparin  Disposition: DC home  Subjective:  Donald [name] denied any new complaints today.  Patient is not having any chest pain morning.  What was bothering him this morning was bilateral lower quadrant abdominal pain.  The pain was described as stabbing sensation sensation on left and right lower quadrants did not radiate further.  Patient reported improved with eating and worsened with lying down.  No diarrhea, no constipation, no vomiting no nausea.  Objective: Temp:  [97.9 F (36.6 C)-98.6 F (37 C)] 98.6 F (37 C) (07/21 0743) Pulse Rate:  [71-86] 86 (07/21 0743) Resp:  [13-22] 22 (07/21 0743) BP: (110-141)/(65-99) 122/72 (07/21 0743) SpO2:  [93 %-99 %] 93 % (07/21 0743) Weight:  [141 lb (64 kg)-142 lb 13.7 oz (64.8 kg)] 142 lb 13.7 oz (64.8 kg) (07/21 0253) Physical Exam: General: Alert and cooperative and appears to be in no acute distress HEENT: Neck non-tender without lymphadenopathy, masses or thyromegaly Cardio: Normal A1 and S2, no S3 or S4. Distant heart sounds, regular rate and rhythm. No murmurs or rubs.  Pulm: Clear to auscultation bilaterally, no crackles, wheezing, or diminished breath sounds. Normal respiratory effort Abdomen: Bowel sounds normal.  Abdomen tender to palpation in all quadrants. Extremities: No  peripheral edema. Warm/ well perfused.  Strong radial pulses. Neuro: Cranial nerves grossly intact  Laboratory: Recent Labs  Lab 01/11/18 2035 01/16/18 2028 01/17/18 0628  WBC 9.3 7.2 8.4  HGB 12.1* 11.6* 11.5*  HCT 37.5* 36.8* 36.3*  PLT 245 264 245   Recent Labs  Lab 01/11/18 2035 01/15/18 0637 01/16/18 2028 01/17/18 0628  NA 138 142 143 141  K 3.9 4.3 4.3 4.0  CL 103 106 106 106  CO2 23 26 27 25   BUN 23 26* 17 16  CREATININE 1.54* 1.48* 1.36* 1.32*  CALCIUM 9.2 9.5 8.9 9.0  PROT 6.7 6.9 6.4*  --   BILITOT 0.7 0.2* 0.5  --   ALKPHOS 16* 29* 20*  --   ALT 17 17 17   --   AST 16 17 17   --   GLUCOSE 88 94 94 84      Imaging/Diagnostic Tests: Results for orders placed or performed during the hospital encounter of 01/16/18  MRSA PCR Screening     Status: None   Collection Time: 01/17/18  4:53 AM  Result Value Ref Range Status   MRSA by PCR NEGATIVE NEGATIVE Final    Comment:        The GeneXpert MRSA Assay (FDA approved for NASAL specimens only), is one component of a comprehensive MRSA colonization surveillance program. It is not intended to diagnose MRSA infection nor to guide or monitor treatment for MRSA infections. Performed at [facility], [place] [address], [place], [place] [address]      Donald Haymaker, MD 01/17/2018, 9:13 AM PGY-1, [place] Intern pager: [phone], text pages welcome

## 2018-01-18 ENCOUNTER — Ambulatory Visit: Payer: Medicare HMO | Admitting: Cardiovascular Disease

## 2018-01-18 ENCOUNTER — Ambulatory Visit: Payer: Self-pay

## 2018-01-18 ENCOUNTER — Ambulatory Visit: Payer: Self-pay | Admitting: Pharmacist

## 2018-01-18 ENCOUNTER — Other Ambulatory Visit: Payer: Self-pay | Admitting: Pharmacist

## 2018-01-18 DIAGNOSIS — I2510 Atherosclerotic heart disease of native coronary artery without angina pectoris: Secondary | ICD-10-CM | POA: Diagnosis not present

## 2018-01-18 DIAGNOSIS — Z8673 Personal history of transient ischemic attack (TIA), and cerebral infarction without residual deficits: Secondary | ICD-10-CM | POA: Diagnosis not present

## 2018-01-18 DIAGNOSIS — N179 Acute kidney failure, unspecified: Secondary | ICD-10-CM | POA: Diagnosis not present

## 2018-01-18 DIAGNOSIS — I509 Heart failure, unspecified: Secondary | ICD-10-CM | POA: Diagnosis not present

## 2018-01-18 DIAGNOSIS — I25110 Atherosclerotic heart disease of native coronary artery with unstable angina pectoris: Secondary | ICD-10-CM | POA: Diagnosis not present

## 2018-01-18 DIAGNOSIS — I69328 Other speech and language deficits following cerebral infarction: Secondary | ICD-10-CM | POA: Diagnosis not present

## 2018-01-18 DIAGNOSIS — K219 Gastro-esophageal reflux disease without esophagitis: Secondary | ICD-10-CM | POA: Diagnosis not present

## 2018-01-18 DIAGNOSIS — F322 Major depressive disorder, single episode, severe without psychotic features: Secondary | ICD-10-CM | POA: Diagnosis not present

## 2018-01-18 DIAGNOSIS — R0789 Other chest pain: Secondary | ICD-10-CM | POA: Diagnosis not present

## 2018-01-18 DIAGNOSIS — E039 Hypothyroidism, unspecified: Secondary | ICD-10-CM | POA: Diagnosis not present

## 2018-01-18 DIAGNOSIS — F329 Major depressive disorder, single episode, unspecified: Secondary | ICD-10-CM | POA: Diagnosis not present

## 2018-01-18 DIAGNOSIS — F0151 Vascular dementia with behavioral disturbance: Secondary | ICD-10-CM | POA: Diagnosis not present

## 2018-01-18 LAB — COMPREHENSIVE METABOLIC PANEL
ALT: 17 U/L (ref 0–44)
ANION GAP: 9 (ref 5–15)
AST: 16 U/L (ref 15–41)
Albumin: 3.2 g/dL — ABNORMAL LOW (ref 3.5–5.0)
Alkaline Phosphatase: 24 U/L — ABNORMAL LOW (ref 38–126)
BILIRUBIN TOTAL: 0.5 mg/dL (ref 0.3–1.2)
BUN: 23 mg/dL (ref 8–23)
CHLORIDE: 108 mmol/L (ref 98–111)
CO2: 28 mmol/L (ref 22–32)
Calcium: 8.9 mg/dL (ref 8.9–10.3)
Creatinine, Ser: 1.62 mg/dL — ABNORMAL HIGH (ref 0.61–1.24)
GFR calc Af Amer: 51 mL/min — ABNORMAL LOW (ref >60)
GFR, EST NON AFRICAN AMERICAN: 44 mL/min — AB (ref >60)
Glucose, Bld: 102 mg/dL — ABNORMAL HIGH (ref 70–99)
POTASSIUM: 4.3 mmol/L (ref 3.5–5.1)
Sodium: 145 mmol/L (ref 135–145)
TOTAL PROTEIN: 6.2 g/dL — AB (ref 6.5–8.1)

## 2018-01-18 LAB — CBC
HEMATOCRIT: 34.6 % — AB (ref 39.0–52.0)
Hemoglobin: 11.1 g/dL — ABNORMAL LOW (ref 13.0–17.0)
MCH: 28.3 pg (ref 26.0–34.0)
MCHC: 32.1 g/dL (ref 30.0–36.0)
MCV: 88.3 fL (ref 78.0–100.0)
PLATELETS: 236 10*3/uL (ref 150–400)
RBC: 3.92 MIL/uL — AB (ref 4.22–5.81)
RDW: 13.6 % (ref 11.5–15.5)
WBC: 7.9 10*3/uL (ref 4.0–10.5)

## 2018-01-18 MED ORDER — SODIUM CHLORIDE 0.9 % IV SOLN
INTRAVENOUS | Status: DC
  Administered 2018-01-18: 17:00:00 via INTRAVENOUS

## 2018-01-18 NOTE — Progress Notes (Addendum)
Gave patient AD forms and he wanted his family to go over them.  Call for chaplain if any questions or when he is ready to complete.   Patient was very excited and said he is having a heart surgery (he called it a transplant) after a long wait (10 YEARS) he wants to be strong and healthy and see his grandkids grow up.  He was very excited-had prayer for his medical team and for all the preparations and for his body to be ready for his upcoming procedure/surgery. Conard Novak, Chaplain

## 2018-01-18 NOTE — Patient Outreach (Signed)
[facility]) Care Management  01/18/2018  JESSE NOSBISCH 12-21-1955 [id number]   [facility] referral to Pharmacy for medication management. Patient admitted to ED due to chest pain on 01/16/18 and remains inpatient at this time.  Pharmacy will continue to follow patient and complete outreach once patient discharged home.   Plan: -Will follow up outreach in 3-5 business days  Regina Eck, PharmD, [place]  [phone]

## 2018-01-18 NOTE — Progress Notes (Signed)
Patient is excited about possibilities from his upcoming heart surgery (he calls it a transplant)  He wants to go over the AD forms with his family before completing them and having notarized.  Call chaplain when ready to sign. Conard Novak, Chaplain   01/18/18 1400  Clinical Encounter Type  Visited With Patient;Family  Visit Type Initial;Spiritual support;Pre-op  Referral From Physician  Consult/Referral To Chaplain  Spiritual Encounters  Spiritual Needs Prayer  Stress Factors  Patient Stress Factors Health changes;Major life changes (preparing for heart surgery/procedures (he says transplant))

## 2018-01-18 NOTE — Progress Notes (Addendum)
Family Medicine Teaching Service Daily Progress Note Intern Pager: [phone]  Patient name: Donald [name] Medical record number: [id number] Date of birth: [birth year] Age: 62 y.o. Gender: male  Primary Care Provider: [name], MD Consultants: none Code Status: Full  Pt Overview and Major Events to Date:  7/21 - admitted for chest pain  Assessment and Plan:  Chest pain w h/o CHF, CAD - ACS ruled out  ACS ruled out at this point.  Based on unremarkable chest x-ray, no events on overnight telemetry, initial and repeat ECG with only nonspecific T wave changes, and negative serial troponins. -continue home atorvastatin 40mg , carvedilol 3.125mg  bid, niacin 250mg  daily -ASA 81mg  daily -nitrostat PRN  CHF work-up Last seen by Cardiologist, Dr. Sallyanne Kuster on 10/16/17 at which time patient recommended for gadolinium MRI to determine if possible bypass surgery is needed. Patient with scheduled cardiology appointment on 01/18/18 but appears to be canceled, patient denies canceling appointment. Echo from 07/29/17 showing LVEF 40-45%.Cardiac cath in 08/2007 with 40% left main disease, 100% mid LAD, 90% ostial LCX dz with small distal vessel.In ED istat troponin trended at 0.02>0.07 with EKG showing no significant changes from previous. - contact cards re gadolinium MR Heart  Abdominal pain CT abdomen showing 1.3cm left renal lesion, possible renal neoplasm. Recommended for outpatient MRI w and w/out contrast as outpatient -recommended outpatient abdominal MRI with and without contrast   AKI Cr of 1.36, similar to baseline. 1.62 on (7/22). Baseline Cr per chart review ~1.2-1.3.  -monitor on daily BMP  H/o stroke in 2015 With subsequent deficits of slowed/slurred speech with difficulties swallowing and R UE weakness. 5/5 strength on exam. Speech only minimally slurred. Per last neurology note patient to continue ASA 325mg . Recently discontinued during behavioral health admission,  unclear why. Will continue ASA 81 mg while inpatient  -ASA 81mg    FEN/GI: Heart healty PPx: heparin  Disposition: DC home  Subjective:  She appeared well this morning was sitting comfortably in his chair position.  He had no new complaints this morning.  He continues to have some lower abdominal stomach pain but has had no chest pain or palpitations.  Spoke briefly about gadolinium MRI by cardiology is interested in the study.  Objective: Temp:  [97.1 F (36.2 C)-98.6 F (37 C)] 97.9 F (36.6 C) (07/21 2345) Pulse Rate:  [75-86] 77 (07/21 2345) Resp:  [11-22] 17 (07/21 2345) BP: (106-122)/(56-76) 106/56 (07/21 2345) SpO2:  [93 %-96 %] 94 % (07/21 2345) Physical Exam: General: Alert and cooperative and appears to be in no acute distress HEENT: Neck non-tender without lymphadenopathy, masses or thyromegaly Cardio: Normal A1 and S2, no S3 or S4. Distant heart sounds, regular rate and rhythm. No murmurs or rubs.   Pulm: Clear to auscultation bilaterally, no crackles, wheezing, or diminished breath sounds. Normal respiratory effort Abdomen: Bowel sounds normal.  Abdomen tender to palpation in all quadrants. Extremities: No peripheral edema. Warm/ well perfused.  Strong radial pulses. Neuro: Cranial nerves grossly intact  Laboratory: Recent Labs  Lab 01/16/18 2028 01/17/18 0628 01/18/18 0243  WBC 7.2 8.4 7.9  HGB 11.6* 11.5* 11.1*  HCT 36.8* 36.3* 34.6*  PLT 264 245 236   Recent Labs  Lab 01/15/18 0637 01/16/18 2028 01/17/18 0628 01/18/18 0243  NA 142 143 141 145  K 4.3 4.3 4.0 4.3  CL 106 106 106 108  CO2 26 27 25 28   BUN 26* 17 16 23   CREATININE 1.48* 1.36* 1.32* 1.62*  CALCIUM 9.5 8.9 9.0 8.9  PROT 6.9 6.4*  --  6.2*  BILITOT 0.2* 0.5  --  0.5  ALKPHOS 29* 20*  --  24*  ALT 17 17  --  17  AST 17 17  --  16  GLUCOSE 94 94 84 102*      Imaging/Diagnostic Tests: Results for orders placed or performed during the hospital encounter of 01/16/18  MRSA PCR  Screening     Status: None   Collection Time: 01/17/18  4:53 AM  Result Value Ref Range Status   MRSA by PCR NEGATIVE NEGATIVE Final    Comment:        The GeneXpert MRSA Assay (FDA approved for NASAL specimens only), is one component of a comprehensive MRSA colonization surveillance program. It is not intended to diagnose MRSA infection nor to guide or monitor treatment for MRSA infections. Performed at [facility], [place] [address], [place], [place] [address]      Matilde Haymaker, MD 01/18/2018, 5:56 AM PGY-1, [facility] Intern pager: [phone], text pages welcome

## 2018-01-18 NOTE — Progress Notes (Signed)
Cardiologist called and recommended starting fluids.  To correct to the AKI in preparation for gadolinium MRI tomorrow.  Normal saline started at 50 mils per hour.

## 2018-01-19 DIAGNOSIS — F0151 Vascular dementia with behavioral disturbance: Secondary | ICD-10-CM | POA: Diagnosis not present

## 2018-01-19 DIAGNOSIS — Z8673 Personal history of transient ischemic attack (TIA), and cerebral infarction without residual deficits: Secondary | ICD-10-CM | POA: Diagnosis not present

## 2018-01-19 DIAGNOSIS — I25110 Atherosclerotic heart disease of native coronary artery with unstable angina pectoris: Secondary | ICD-10-CM | POA: Diagnosis not present

## 2018-01-19 DIAGNOSIS — F322 Major depressive disorder, single episode, severe without psychotic features: Secondary | ICD-10-CM | POA: Diagnosis not present

## 2018-01-19 DIAGNOSIS — R0789 Other chest pain: Secondary | ICD-10-CM | POA: Diagnosis not present

## 2018-01-19 LAB — CBC
HEMATOCRIT: 32.4 % — AB (ref 39.0–52.0)
Hemoglobin: 10.4 g/dL — ABNORMAL LOW (ref 13.0–17.0)
MCH: 28.7 pg (ref 26.0–34.0)
MCHC: 32.1 g/dL (ref 30.0–36.0)
MCV: 89.3 fL (ref 78.0–100.0)
PLATELETS: 212 10*3/uL (ref 150–400)
RBC: 3.63 MIL/uL — ABNORMAL LOW (ref 4.22–5.81)
RDW: 13.5 % (ref 11.5–15.5)
WBC: 7.1 10*3/uL (ref 4.0–10.5)

## 2018-01-19 LAB — BASIC METABOLIC PANEL
Anion gap: 9 (ref 5–15)
BUN: 26 mg/dL — ABNORMAL HIGH (ref 8–23)
CHLORIDE: 107 mmol/L (ref 98–111)
CO2: 24 mmol/L (ref 22–32)
CREATININE: 1.4 mg/dL — AB (ref 0.61–1.24)
Calcium: 8.4 mg/dL — ABNORMAL LOW (ref 8.9–10.3)
GFR calc non Af Amer: 52 mL/min — ABNORMAL LOW (ref >60)
Glucose, Bld: 98 mg/dL (ref 70–99)
POTASSIUM: 4.3 mmol/L (ref 3.5–5.1)
Sodium: 140 mmol/L (ref 135–145)

## 2018-01-19 NOTE — Progress Notes (Signed)
Patient discharged home with friend. Paperwork reviewed, questions answered. Personal belongings packed/sent home with patient. IV removed.

## 2018-01-19 NOTE — Progress Notes (Signed)
Family Medicine Teaching Service Daily Progress Note Intern Pager: [phone]  Patient name: Donald [name] Medical record number: [id number] Date of birth: 11/30/1955 Age: 62 y.o. Gender: male  Primary Care Provider: [name], MD Consultants: none Code Status: Full  Pt Overview and Major Events to Date:  7/21 - admitted for chest pain 7/23 - Gadolinium Donald heart  Assessment and Plan:  CHF work-up - Gadolinium Donald Heart planned for 7/23. - pt given NS 50 mL/hr to help correct Cr in prep for study 7/23  AKI Cr this am 1.40(7/23). Baseline Cr per chart review ~1.2-1.3.  -monitor on daily BMP  Chest pain w h/o CHF, CAD - ACS ruled out  -continue home atorvastatin 40mg , carvedilol 3.125mg  bid, niacin 250mg  daily -ASA 81mg  daily -nitrostat PRN  Abdominal pain - stable CT abdomen showing 1.3cm left renal lesion, possible renal neoplasm. Recommended for outpatient MRI w and w/out contrast as outpatient -recommended outpatient abdominal MRI with and without contrast   H/o stroke in 2015 With subsequent deficits of slowed/slurred speech with difficulties swallowing and R UE weakness. 5/5 strength on exam. Speech only minimally slurred. Per last neurology note patient to continue ASA 325mg . Recently discontinued during behavioral health admission, unclear why. Will continue ASA 81 mg while inpatient  -ASA 81mg    FEN/GI: Heart healty PPx: heparin  Disposition: DC home pending Donald study and cardiology recs  Subjective:  Spoke with Donald [name] this morning who had no new complaints.  He reports no chest pain headaches or palpitations.  His stomach pain has mildly improved.  He is aware that he is in the hospital presently to get his gadolinium MRI.  Objective: Temp:  [97.4 F (36.3 C)-97.7 F (36.5 C)] 97.5 F (36.4 C) (07/23 0728) Pulse Rate:  [67-81] 69 (07/23 0728) Resp:  [13-18] 14 (07/23 0728) BP: (90-138)/(62-74) 125/71 (07/23 0728) SpO2:  [98 %-100 %] 99 %  (07/23 0728) Physical Exam: General: Alert and cooperative and appears to be in no acute distress.  He was able to sit up in bed and move to the side of his bed this morning during exam. HEENT: Neck non-tender without lymphadenopathy, masses or thyromegaly Cardio: Normal A1 and S2, no S3 or S4.  Regular rate and rhythm.  Distant heart sounds. Pulm: Clear to auscultation bilaterally, no crackles, wheezing, or diminished breath sounds. Normal respiratory effort Abdomen: Bowel sounds normal. Abdomen soft and non-tender.  Extremities: No peripheral edema. Warm/ well perfused.  Strong radial and pedal pulses. Neuro: Cranial nerves grossly intact    Laboratory: Recent Labs  Lab 01/17/18 0628 01/18/18 0243 01/19/18 0533  WBC 8.4 7.9 7.1  HGB 11.5* 11.1* 10.4*  HCT 36.3* 34.6* 32.4*  PLT 245 236 212   Recent Labs  Lab 01/15/18 0637 01/16/18 2028 01/17/18 0628 01/18/18 0243 01/19/18 0533  NA 142 143 141 145 140  K 4.3 4.3 4.0 4.3 4.3  CL 106 106 106 108 107  CO2 26 27 25 28 24   BUN 26* 17 16 23  26*  CREATININE 1.48* 1.36* 1.32* 1.62* 1.40*  CALCIUM 9.5 8.9 9.0 8.9 8.4*  PROT 6.9 6.4*  --  6.2*  --   BILITOT 0.2* 0.5  --  0.5  --   ALKPHOS 29* 20*  --  24*  --   ALT 17 17  --  17  --   AST 17 17  --  16  --   GLUCOSE 94 94 84 102* 98      Imaging/Diagnostic Tests:  Results for orders placed or performed during the hospital encounter of 01/16/18  MRSA PCR Screening     Status: None   Collection Time: 01/17/18  4:53 AM  Result Value Ref Range Status   MRSA by PCR NEGATIVE NEGATIVE Final    Comment:        The GeneXpert MRSA Assay (FDA approved for NASAL specimens only), is one component of a comprehensive MRSA colonization surveillance program. It is not intended to diagnose MRSA infection nor to guide or monitor treatment for MRSA infections. Performed at [facility], [place] [address], [place], [place] [address]      Matilde Haymaker, MD 01/19/2018, 8:42  AM PGY-1, [place] Intern pager: [phone], text pages welcome

## 2018-01-19 NOTE — Progress Notes (Signed)
cMRI was actually performed on Nov 11, 2017 and did not show significant viability in the LAD artery territory - mostly scar in the apex, only a small area of viable inferoapical myocardium.  FINDINGS: 1. Normal left ventricular size, thickness with mildly to moderately decreased systolic function (LVEF = 44%). There is akinesis of the apical inferior wall and aneurysmal dilatation of the apical septal, anterior, lateral walls and true apex.  There is late gadolinium enhancement present in the following segments:  Apical septal wall: 75-100% (poor change of recovery if revascularized)  Apical anterior wall: 75-100% (poor change of recovery if revascularized)  Apical lateral wall: 50-75% (poor change of recovery if revascularized)  Apical inferior wall: 0-25% (good change of recovery if revascularized)  True apex: 75-100% (poor change of recovery if revascularized)  Plan medical therapy for CAD based on those results.  Sanda Klein, MD, [facility] [phone] office [phone] pager

## 2018-01-20 ENCOUNTER — Other Ambulatory Visit: Payer: Self-pay | Admitting: Cardiovascular Disease

## 2018-01-20 ENCOUNTER — Other Ambulatory Visit: Payer: Self-pay | Admitting: Licensed Clinical Social Worker

## 2018-01-20 NOTE — Patient Outreach (Signed)
[facility]) Care Management  01/20/2018  Donald [name] [birth year] [id number]  CSW completed final outreach attempt today after receiving notification of patient's hospital discharge. CSW unable to reach patient successfully. CSW left a HIPPA compliant voice message encouraging patient to return call once available. Unsuccessful outreach letter sent on 01/12/18. [facility] CSW will sign off at this time due to 3 unsuccessful outreach attempts made. [facility] CSW is happy to get back involved if patient is still interested in receiving [place] social work assistance. Voice message left informing patient of that as well.   Eula Fried, BSW, MSW, [email or web address] Phone: [phone] Fax: [phone]

## 2018-01-21 ENCOUNTER — Other Ambulatory Visit: Payer: Self-pay | Admitting: Licensed Clinical Social Worker

## 2018-01-21 ENCOUNTER — Other Ambulatory Visit: Payer: Self-pay

## 2018-01-21 NOTE — Patient Outreach (Signed)
[facility]) Care Management  01/21/2018  Donald [name] 11/17/1955 [id number]    BSW received request from [place], Eula Fried, to mail housing and senior resources to patient.  Resources mailed today.    Ronn Melena, BSW Social Worker [phone]

## 2018-01-21 NOTE — Patient Outreach (Addendum)
  [facility]) Care Management  01/21/2018  NICKOLIS DIEL [birth year] [id number]  [facility] CSW received incoming return call from patient. Initial referral stated that patient was requesting assistance with applying for Medicaid, finding " senior apartment", and personal care aid assistance. Patient is status post stroke. 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 Per chart, "Patient states that in the past he suffered from depression but does not currently take medication for depression. His longtime partner of 20 years left him on 01/11/18 and which led to a suicidal attempt. He took almost an entire bottle of sublingual nitroglycerin approximately 20 tablets. He states it was stupid he should not have done it but he was just feeling so sad and helpless. Prior history of SI about 20 years ago but has not had anything in between." Patient was admitted to [facility] and has discharged back home. Patient reports that he is currently staying at his nephew's house until his significant other gathers her belongings from their old home and moves out so that he can relocate back there. However, patient is still in need of stable housing and wishes to gain housing resources and a list of senior apartments. Patient denies experiencing any suicidal ideations at this time and reports to be feeling very well now. Patient states "I don't know why I did that. It was a huge mistake and it will never happen again." Patient reports currently taking zoloft which is effectively working for him. Patient repeated this statement several times throughout the conversation and confirms that he is managing his depression well at this time by medication. Patient denies wanting mental health resources at this time and reports only wanting to focus on housing, transportation and Medicaid at this time with [facility] CSW. Patient reports that he does have  stable transportation through family members but would like to gain [name] as well. Patient also interested in gaining Medicaid and would like assistance with applying for program. Patient is agreeable to [facility] BSW referral in order to assist with Medicaid and [name]. [facility] CSW provided education on available housing resources within the local area. Patient is agreeable to [facility] mailing patient a complete list of available housing properties off [address] within his budget, list of senior living options within the area and housing resources. Patient reports that he would like this information to be mailed to his nephew's residence which is [address], [place], [place] [address]. Patient is also interested in receiving personal care assistance but wishes to see if he can qualify for Medicaid first. [facility] CSW will mail patient a list of personal care resources as well. Patient is agreeable to a follow up call within two weeks to ensure housing and personal care resources were received, education was provided again and all questions have been answered. [facility] CSW will make referral for [facility] BSW at this time to assist patient with gaining [name] and Medicaid. [facility] CSW will follow up within two weeks.  Eula Fried, BSW, MSW, [email or web address] Phone: [phone] Fax: [phone]

## 2018-01-22 ENCOUNTER — Other Ambulatory Visit: Payer: Self-pay

## 2018-01-22 NOTE — Patient Outreach (Signed)
[facility]) Care Management  01/22/2018  Donald [name] 07/24/1955 [id number]   Primary care provider Referral. 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    62 year old with history of CAD, cerebral aneurysm, stroke, major depressive disorder.  01/11/18 behavior health admission.  12/14/17 Emergency room visit for depression 12/07/17 Emergency room visit for chest/abdominal pain, altered mental status 11/29/17 emergency room visit for blood pressure check  RNCM called to assess care management needs and schedule home visit. No answer. HIPPA complaint message left.  Plan: send outreach letter and follow up within 3-4 business days   Thea Silversmith, Therapist, sports, MSN, [place] Coordinator Cell: [phone]

## 2018-01-25 ENCOUNTER — Other Ambulatory Visit: Payer: Self-pay

## 2018-01-25 NOTE — Patient Outreach (Signed)
[facility]) Care Management  01/25/2018  Donald [name] 23-Oct-1955 [id number]  Primary care provider Referral. 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    62 year old with history of CAD, cerebral aneurysm, stroke, major depressive disorder.  01/11/18 behavior health admission.  12/14/17 Emergency room visit for depression 12/07/17 Emergency room visit for chest/abdominal pain, altered mental status 11/29/17 emergency room visit for blood pressure check  RNCM called to engage in nursing care coordination services. Client answered the phone. Two patient identifiers confirmed. Client reports he now has moved in with his sister. He denies depression at this time stating he was "getting over it". He states he has all his medications. RNCM will complete home assessment to further evaluate care management needs.   Plan: Home visit scheduled.  Thea Silversmith, RN, MSN, [place] Coordinator Cell: [phone]

## 2018-01-26 ENCOUNTER — Emergency Department (HOSPITAL_COMMUNITY)
Admission: EM | Admit: 2018-01-26 | Discharge: 2018-01-27 | Disposition: A | Discharge status: Home / Self Care | Payer: Medicare HMO | Attending: Emergency Medicine | Admitting: Emergency Medicine

## 2018-01-26 ENCOUNTER — Encounter (HOSPITAL_COMMUNITY): Payer: Self-pay | Admitting: Emergency Medicine

## 2018-01-26 ENCOUNTER — Emergency Department (HOSPITAL_COMMUNITY): Payer: Medicare HMO

## 2018-01-26 ENCOUNTER — Other Ambulatory Visit: Payer: Self-pay

## 2018-01-26 DIAGNOSIS — E039 Hypothyroidism, unspecified: Secondary | ICD-10-CM | POA: Insufficient documentation

## 2018-01-26 DIAGNOSIS — Z79899 Other long term (current) drug therapy: Secondary | ICD-10-CM | POA: Diagnosis not present

## 2018-01-26 DIAGNOSIS — Z8673 Personal history of transient ischemic attack (TIA), and cerebral infarction without residual deficits: Secondary | ICD-10-CM | POA: Diagnosis not present

## 2018-01-26 DIAGNOSIS — R1030 Lower abdominal pain, unspecified: Secondary | ICD-10-CM | POA: Insufficient documentation

## 2018-01-26 DIAGNOSIS — I2510 Atherosclerotic heart disease of native coronary artery without angina pectoris: Secondary | ICD-10-CM | POA: Insufficient documentation

## 2018-01-26 DIAGNOSIS — Z87891 Personal history of nicotine dependence: Secondary | ICD-10-CM | POA: Diagnosis not present

## 2018-01-26 DIAGNOSIS — I509 Heart failure, unspecified: Secondary | ICD-10-CM | POA: Insufficient documentation

## 2018-01-26 DIAGNOSIS — R079 Chest pain, unspecified: Secondary | ICD-10-CM | POA: Insufficient documentation

## 2018-01-26 DIAGNOSIS — R2243 Localized swelling, mass and lump, lower limb, bilateral: Secondary | ICD-10-CM | POA: Insufficient documentation

## 2018-01-26 DIAGNOSIS — M7989 Other specified soft tissue disorders: Secondary | ICD-10-CM | POA: Diagnosis not present

## 2018-01-26 LAB — I-STAT TROPONIN, ED: Troponin i, poc: 0 ng/mL (ref 0.00–0.08)

## 2018-01-26 LAB — BASIC METABOLIC PANEL
Anion gap: 11 (ref 5–15)
BUN: 17 mg/dL (ref 8–23)
CO2: 25 mmol/L (ref 22–32)
Calcium: 9.1 mg/dL (ref 8.9–10.3)
Chloride: 103 mmol/L (ref 98–111)
Creatinine, Ser: 1.32 mg/dL — ABNORMAL HIGH (ref 0.61–1.24)
GFR calc Af Amer: >60 mL/min (ref >60)
GFR, EST NON AFRICAN AMERICAN: 56 mL/min — AB (ref >60)
GLUCOSE: 94 mg/dL (ref 70–99)
Potassium: 4.2 mmol/L (ref 3.5–5.1)
Sodium: 139 mmol/L (ref 135–145)

## 2018-01-26 LAB — URINALYSIS, ROUTINE W REFLEX MICROSCOPIC
Bilirubin Urine: NEGATIVE
GLUCOSE, UA: NEGATIVE mg/dL
Hgb urine dipstick: NEGATIVE
Ketones, ur: NEGATIVE mg/dL
LEUKOCYTES UA: NEGATIVE
Nitrite: NEGATIVE
PH: 6 (ref 5.0–8.0)
Protein, ur: NEGATIVE mg/dL
SPECIFIC GRAVITY, URINE: 1.015 (ref 1.005–1.030)

## 2018-01-26 LAB — CBC
HCT: 38.7 % — ABNORMAL LOW (ref 39.0–52.0)
Hemoglobin: 12 g/dL — ABNORMAL LOW (ref 13.0–17.0)
MCH: 27.8 pg (ref 26.0–34.0)
MCHC: 31 g/dL (ref 30.0–36.0)
MCV: 89.8 fL (ref 78.0–100.0)
Platelets: 250 10*3/uL (ref 150–400)
RBC: 4.31 MIL/uL (ref 4.22–5.81)
RDW: 13.2 % (ref 11.5–15.5)
WBC: 8.1 10*3/uL (ref 4.0–10.5)

## 2018-01-26 LAB — BRAIN NATRIURETIC PEPTIDE: B Natriuretic Peptide: 220.8 pg/mL — ABNORMAL HIGH (ref 0.0–100.0)

## 2018-01-26 LAB — LIPASE, BLOOD: Lipase: 37 U/L (ref 11–51)

## 2018-01-26 MED ORDER — NITROGLYCERIN 0.4 MG SL SUBL: 0.4000 mg | SUBLINGUAL_TABLET | SUBLINGUAL | Status: DC | PRN

## 2018-01-26 MED ORDER — ASPIRIN 81 MG PO CHEW
324.0000 mg | CHEWABLE_TABLET | Freq: Once | ORAL | Status: AC
  Administered 2018-01-27: 324 mg via ORAL
  Filled 2018-01-26: qty 4

## 2018-01-26 NOTE — ED Triage Notes (Signed)
Centralized chest "squeezing" that started after he completed some housework.  Also describes some shooting generalized lower quad abd pain.  Pt noticed some bilateral swelling his his legs, does not appear "too bad."
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
## 2018-01-26 NOTE — ED Notes (Signed)
Wife approaches NF and reports pt is c/o increased chest pain.  BP 126/67, HR 74, O2 100%/RA; pt reports pain is different than before, reports pain is 8/10 with shortness of breath.  EKG repeated

## 2018-01-26 NOTE — Patient Outreach (Signed)
[facility]) Care Management  01/26/2018  Donald [name] December 30, 1955 [id number]  Initial outreach regarding social work referral for [name] and [name].  BSW completed [name] application with [name] and faxed to eligibility.  BSW informed him that he will be contacted by [name] to schedule the face to face interview.  BSW will follow up with him next week to determine status of [name] and to ensure receipt of Medicaid application that was mailed.   Ronn Melena, BSW Social Worker [phone]

## 2018-01-26 NOTE — ED Provider Notes (Signed)
Patient placed in Quick Look pathway, seen and evaluated   Chief Complaint: leg swelling  HPI:   Pt states he has been having L leg swelling for the past few days. He has associated CP/tightness, but this has been present for >1 yr. He denies change in his CP. He has associated sob at night, not with exertion. He states he has a "bad heart" but cannot tell me why. He is not on fluid pills. Pt states he was told he has a blood clot in his leg, but he was told to stop his ASA and was not started on a blood thinner. He has a h/o chronic abd pain, no changes recently.  Pt denies recent travel, surgeries, immobilization, trauma.   ROS: leg swelling  Physical Exam:   Gen: No distress  Neuro: Awake and Alert  Skin: Warm    Focused Exam: L leg swelling with TTP of posterior leg. Pedal pulses intact. Rrr, ctab.   Per chart review, no diagnosis of dvt in the system. He is not on a blood thinner. Concern for possible dvt. Cannot order [place] until tomorrow.    Initiation of care has begun. The patient has been counseled on the process, plan, and necessity for staying for the completion/evaluation, and the remainder of the medical screening examination    Franchot Heidelberg, PA-C 01/26/18 2000    Hayden Rasmussen, MD 01/27/18 [phone]

## 2018-01-27 ENCOUNTER — Other Ambulatory Visit: Payer: Self-pay | Admitting: Licensed Clinical Social Worker

## 2018-01-27 ENCOUNTER — Other Ambulatory Visit: Payer: Self-pay | Admitting: *Deleted

## 2018-01-27 LAB — I-STAT TROPONIN, ED: TROPONIN I, POC: 0.01 ng/mL (ref 0.00–0.08)

## 2018-01-27 MED ORDER — FUROSEMIDE 20 MG PO TABS: 20.0000 mg | ORAL_TABLET | Freq: Every day | ORAL | 0 refills | Status: DC

## 2018-01-27 NOTE — Patient Outreach (Signed)
[facility]) Care Management  01/27/2018  REMBERT BROWE [birth year] [id number]  Assessment-CSW completed outreach attempt today. CSW unable to reach patient successfully. CSW left a HIPPA compliant voice message encouraging patient to return call once available.  Plan-CSW will await return call or complete an additional outreach if needed.  Eula Fried, BSW, MSW, [email or web address] Phone: [phone] Fax: [phone]

## 2018-01-27 NOTE — Patient Outreach (Signed)
[name] Care Management  01/27/2018  Donald [name] [birth year] [id number]   Covering for assigned care manager, notified member was seen in the ED yesterday for chest pain.  Call placed to follow up on visit.  He report he is doing "fine."  Denies any pain or discomfort at this time.  State this has been an ongoing issue and his MDs are working to develop a plan of care. Denies any urgent concerns at this time, will provide update to assigned care manager.  Valente David, [place], MSN [place] [phone]

## 2018-01-29 ENCOUNTER — Other Ambulatory Visit: Payer: Self-pay | Admitting: Licensed Clinical Social Worker

## 2018-01-29 ENCOUNTER — Encounter: Payer: Self-pay | Admitting: Licensed Clinical Social Worker

## 2018-01-29 NOTE — Patient Outreach (Signed)
[facility]) Care Management  01/29/2018  Donald [name] 04-28-1956 [id number]  Assessment-CSW completed second outreach attempt today. CSW unable to reach patient successfully. CSW left a HIPPA compliant voice message encouraging patient to return call once available.  Plan-CSW will await return call and will mail unsuccessful outreach letter at this time.  Eula Fried, BSW, MSW, [email or web address] Phone: [phone] Fax: [phone]

## 2018-02-02 ENCOUNTER — Ambulatory Visit: Payer: Self-pay

## 2018-02-02 ENCOUNTER — Other Ambulatory Visit: Payer: Self-pay | Admitting: Licensed Clinical Social Worker

## 2018-02-02 ENCOUNTER — Other Ambulatory Visit: Payer: Self-pay

## 2018-02-02 NOTE — Patient Outreach (Addendum)
[name]) Care Management  02/02/2018  Donald [name] 10/05/1955 [id number]  [facility] CSW completed third and final outreach attempt and was able to reach patient successfully. HIPPA verifications received. Patient reports that he did receive housing resources in the mail that were sent. Patient reports that he has been sick and has not had a chance to pursue any of these resources but is very appreciative of the handouts. Patient denies any SI/HI and depression at this time and reports to be implementing appropriate self care methods into his daily routine. Patient continues to live with his sister and her family. Patient reports that he is able to stay there for as long as he needs to but would like to have a place of his own. [facility] CSW provided ongoing education on available housing resources within the area that patient was receptive to. [facility] CSW completed entire review of the housing resources with patient. Patient agreeable to pursue housing resources next week. Patient reports actively working with [place] on gaining [name] and Medicaid. Patient was reminded to contact Courtney with [name] in order to schedule face to face interview. [facility] CSW will sign off at this time but is happy to get back involved if future social work needs arise.   [facility] CM Care Plan Problem One     Most Recent Value  Care Plan Problem One  Need for more support to improve overall health and well being  Role Documenting the Problem One  Clinical Social Worker  Care Plan for Problem One  Active  [facility] CM Short Term Goal #1   Pt will gain housing and personal care resources in the mail within 30 days per patient's request and desire for resource information  [facility] CM Short Term Goal #1 Start Date  01/21/18  [facility] CM Short Term Goal #1 Met Date  02/02/18  Interventions for Short Term Goal #1  Goal met. education provided again during outreach call on 02/02/18     Eula Fried, [place], MSW, [email or web address] Phone: [phone] Fax: [phone]

## 2018-02-02 NOTE — Patient Outreach (Signed)
[place] [facility]) Care Management  02/02/2018  ZAVEON GILLEN 06-19-1956 [id number]   Follow up call to Mr. Trivedi to ensure receipt of Medicaid application and regarding status of [name] application.  Mr. Zaremba confirmed receipt of application but said that he has not been contacted by [name].  BSW provided him with contact information for [facility] with [name] eligibility and encouraged him to reach out to her to schedule the face to face interview.  BSW also provided him with contact information for CSW, Eula Fried, and informed him that she has been trying to contact him.  BSW will follow up again next week regarding status of [name].  Ronn Melena, BSW Social Worker [phone]

## 2018-02-02 NOTE — Patient Outreach (Signed)
[facility]) Care Management  02/02/2018  Donald [name] Sep 09, 1955 [id number]  [facility] CSW received incoming call from patient's sister Donald [name] on 02/02/18. HIPPA verifications received. Sister reports that patient wanted her to contact [facility] CSW to get contact information again for [name] caseworker. [facility] CSW provided sister with requested contact information. Sister reports that patient is doing well and that her main goal is to become educated about patient's health conditions as she will be his caregiver now. Sister reports wanting to go with patient to his next PCP appointment in order to become more familiar with patient's care. Sister reports that she has a pharmacy question. Patient's sister has spent time trying to get patient's mail prescriptions transitioned to new address and has contacted them already. However, patient has been without one of his medications for 3 days and sister is worried. Sister is unable to state the name of this prescription at this time but can tell pharmacist the name once she returns home. [facility] CSW will sign off and will forward update to [place].  Eula Fried, BSW, MSW, [email or web address] Phone: [phone] Fax: [phone]

## 2018-02-03 ENCOUNTER — Ambulatory Visit: Payer: Self-pay | Admitting: Pharmacist

## 2018-02-04 ENCOUNTER — Ambulatory Visit: Payer: Self-pay

## 2018-02-04 ENCOUNTER — Other Ambulatory Visit: Payer: Self-pay

## 2018-02-04 ENCOUNTER — Other Ambulatory Visit: Payer: Self-pay | Admitting: Pharmacist

## 2018-02-04 ENCOUNTER — Ambulatory Visit: Payer: Self-pay | Admitting: Pharmacist

## 2018-02-04 NOTE — Patient Outreach (Signed)
[place] [facility]) Care Management   02/04/2018  DAOUD LOBUE 06-23-1956 [id number]  RASHI GIULIANI is an 62 y.o. male  Subjective: client reports he is better.  Objective: BP 138/78   Pulse 78   Resp 20   Ht 1.6 m (5\' 3" ) Comment: patient reported  Wt 140 lb (63.5 kg) Comment: patient reported.  SpO2 97%   BMI 24.80 kg/m    Review of Systems  Respiratory: Negative for cough and shortness of breath.        Decreased breath sounds.  Cardiovascular: Negative for leg swelling.       S1S2 noted, regular    Physical Exam skin warm,dry, color within normal limits.  Encounter Medications:   Outpatient Encounter Medications as of 02/04/2018  Medication Sig Note  . atorvastatin (LIPITOR) 40 MG tablet TAKE 1 TABLET EVERY DAY   . carvedilol (COREG) 3.125 MG tablet Take 3.125 mg by mouth 2 (two) times daily with a meal.   . gabapentin (NEURONTIN) 100 MG capsule TAKE 1 CAPSULE TWICE DAILY   . ibuprofen (ADVIL,MOTRIN) 200 MG tablet Take 400 mg by mouth every 6 (six) hours as needed (for pain or headaches).   . niacin 250 MG tablet Take 250 mg by mouth daily with breakfast.   . nitroGLYCERIN (NITROSTAT) 0.4 MG SL tablet Place 0.4 mg under the tongue every 5 (five) minutes as needed for chest pain. May take up to 3 doses. 01/27/2018: Patient needs a new Rx for this if he is to stay on it (he has no more)  . ranitidine (ZANTAC) 150 MG tablet Take 1 tablet (150 mg total) by mouth daily.   . sertraline (ZOLOFT) 25 MG tablet Take 1 tablet (25 mg total) by mouth daily.   . tamsulosin (FLOMAX) 0.4 MG CAPS capsule TAKE 1 CAPSULE EVERY DAY   . traZODone (DESYREL) 50 MG tablet Take 1 tablet (50 mg total) by mouth at bedtime as needed for sleep.   . vitamin B-12 (CYANOCOBALAMIN) 1000 MCG tablet Take 1,000 mcg by mouth daily.   . furosemide (LASIX) 20 MG tablet Take 1 tablet (20 mg total) by mouth daily.    No facility-administered encounter medications on file as of 02/04/2018.      Functional Status:   In your present state of health, do you have any difficulty performing the following activities: 02/04/2018 01/17/2018  Hearing? N N  Comment - -  Vision? N N  Comment - -  Difficulty concentrating or making decisions? N N  Walking or climbing stairs? N N  Dressing or bathing? N N  Doing errands, shopping? N N  Preparing Food and eating ? N -  Using the Toilet? N -  In the past six months, have you accidently leaked urine? N -  Do you have problems with loss of bowel control? N -  Managing your Medications? Y -  Managing your Finances? N -  Housekeeping or managing your Housekeeping? N -  Some encounter information is confidential and restricted. Go to Review Flowsheets activity to see all data.  Some recent data might be hidden    Fall/Depression Screening:    Fall Risk  02/04/2018 12/28/2017 10/06/2017  Falls in the past year? No Yes No  Number falls in past yr: - 1 -   PHQ 2/9 Scores 02/04/2018 12/28/2017 12/07/2017 10/06/2017 06/10/2017 06/10/2017 06/03/2017  PHQ - 2 Score 0 0 0 1 3 0 6  PHQ- 9 Score - - - - 18 - 20  Assessment:  Primary care provider Referral. 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   62 year old with history of CAD, cerebral aneurysm, stroke, major depressive disorder.  Home visit completed. Present during home visit was client only. Client's nephew was upstairs. Client denies any concerns during home visit. He reports he is doing much better.  Medications reviewed. Client does not have furosemide. Noted on medication list and also on after visit summary from Emergency room visit on 01/26/18. Client unaware that this is ordered. RNCM did not have B12 in his bag of medications. Client states he will follow up with his sister who manages his medications.   Per emergency room notes, heart failure.  Client states his ankles/feet "swoll up" and he went to the emergency room. He states he has not has any swelling since they gave him the medicine in the emergency room. Mr. Schembri states he weights himself everyday and consistently weighs 140 pounds. No edema noted. No shortness of breath.  Plan: RNCM will update [facility] pharmacist, home visit next month. [facility] CM Care Plan Problem One     Most Recent Value  Care Plan Problem One  at risk for readmisstion  Role Documenting the Problem One  Care Management [place] for Problem One  Active  [facility] Long Term Goal   client will not be readmitted within the next 31 days.  [facility] Long Term Goal Start Date  01/25/18  Interventions for Problem One Long Term Goal  RNCM reviewed medications with client, provided [facility] calender/organizer and explained how to used.  [facility] CM Short Term Goal #1   client will take medications as prescribed within the next 30 days.  [facility] CM Short Term Goal #1 Start Date  01/25/18  Interventions for Short Term Goal #1  medications reviewed, care coordination with [facility] pharmacist.  [facility] CM Short Term Goal #2   client will follow up with provider visits as scheduled within the next 30 days.  [facility] CM Short Term Goal #2 Start Date  01/25/18  Interventions for Short Term Goal #2  reviewed upcoming scheduled appointments.    [facility] CM Care Plan Problem Two     Most Recent Value  Care Plan Problem Two  knowledge deficit related to heart failure management as evidence by clients request for more education.  Role Documenting the Problem Two  Care Management Coordinator  Care Plan for Problem Two  Active  Interventions for Problem Two Long Term Goal   [facility] calender/organizer provided, RNCM discussed the importance of daily weights and reviewed the zone tool  [facility] Long Term Goal  client will express heart failure management strategies within the next 31-60 days.  [facility] Long Term Goal Start Date  02/04/18  [facility] CM Short Term Goal #1   client will  report reviewing the heart failure zone tool within the next 30 days.  [facility] CM Short Term Goal #1 Start Date  02/04/18  Interventions for Short Term Goal #2   RNCM provded the heart faliure action plan and reviewed zone tool with client.     Thea Silversmith, RN, MSN, [place] Coordinator Cell: [phone]

## 2018-02-04 NOTE — Patient Outreach (Addendum)
[place] [facility]) Care Management  02/04/2018  JET ARMBRUST 13-Jan-1956 [id number]  Patient is a 67 yoM with PNH significant for: CAD, MDD, stroke, CHF-EF 40-45% (jan 2019).  [facility] pharmacy referral for medication management/assistance from [facility] LCSW.    Unsuccessful call attempt #1 to patient's sister and caregiver.  Given high priority, call placed to [place] as home visit was scheduled today.  She will call me when at the home to assess medication needs.  [facility] RNCM called after home visit.  Patient's sister is unsure why patient does not have a prescription for Lasix although it was on the discharge summary.  No edema noted on exam today,  Per discharge summary from ED on 7/30, " As he does have a history of congestive heart failure but is not on diuretic therapy, will start Lasix 20 mg daily.  He will follow-up with his primary care physician at the family practice center here at [facility] and cardiology.  Return for recurrent chest pain."  [facility] appears [facility] per [facility]   PLAN: -I will follow up in 3-4 business days per protocol unless call returned sooner. -Unsuccessful outreach letter sent   Regina Eck, PharmD, [place]  [phone]

## 2018-02-05 ENCOUNTER — Ambulatory Visit: Payer: Self-pay | Admitting: Pharmacist
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
## 2018-02-08 ENCOUNTER — Ambulatory Visit: Payer: Medicare HMO | Admitting: Adult Health

## 2018-02-08 ENCOUNTER — Encounter: Payer: Self-pay | Admitting: Adult Health

## 2018-02-08 VITALS — BP 143/81 | HR 79 | Wt 144.0 lb

## 2018-02-08 DIAGNOSIS — I6381 Other cerebral infarction due to occlusion or stenosis of small artery: Secondary | ICD-10-CM | POA: Diagnosis not present

## 2018-02-08 DIAGNOSIS — I10 Essential (primary) hypertension: Secondary | ICD-10-CM | POA: Diagnosis not present

## 2018-02-08 DIAGNOSIS — E785 Hyperlipidemia, unspecified: Secondary | ICD-10-CM

## 2018-02-08 MED ORDER — ASPIRIN 81 MG PO TABS: 81.0000 mg | ORAL_TABLET | Freq: Every day | ORAL | 0 refills | Status: DC

## 2018-02-08 NOTE — Patient Instructions (Signed)
Start aspirin 81 mg daily  and continue lipitor  for secondary stroke prevention  Continue to follow up with PCP regarding cholesterol and blood pressure management   Follow up with cardiology as scheduled   Continue to stay active and maintain a healthy diet  Sleep concerns - Important for sleep hygiene - no TVs in bedroom, dark room, quiet room, no pets on the bed, no caffeine prior to going to bed. - speak to PCP regarding anxiety/depression management that may be interfering with sleep   Follow up with eye doctor for continued vision loss  Continue to monitor blood pressure at home  Maintain strict control of hypertension with blood pressure goal below 130/90, diabetes with hemoglobin A1c goal below 6.5% and cholesterol with LDL cholesterol (bad cholesterol) goal below 70 mg/dL. I also advised the patient to eat a healthy diet with plenty of whole grains, cereals, fruits and vegetables, exercise regularly and maintain ideal body weight.  Followup in the future with me in 6 months or call earlier if needed       Thank you for coming to see [place] at [facility]. I hope we have been able to provide you high quality care today.  You may receive a patient satisfaction survey over the next few weeks. We would appreciate your feedback and comments so that we may continue to improve ourselves and the health of our patients.

## 2018-02-09 ENCOUNTER — Ambulatory Visit (INDEPENDENT_AMBULATORY_CARE_PROVIDER_SITE_OTHER): Payer: Medicare HMO | Admitting: *Deleted

## 2018-02-09 ENCOUNTER — Other Ambulatory Visit: Payer: Self-pay

## 2018-02-09 DIAGNOSIS — I6300 Cerebral infarction due to thrombosis of unspecified precerebral artery: Secondary | ICD-10-CM | POA: Diagnosis not present

## 2018-02-09 NOTE — Patient Outreach (Addendum)
[place] [facility]) Care Management  02/09/2018  Donald [name] 1955/08/28 [id number]   Follow up call to Donald [name] regarding the status of [name].  Per Donald [name], he spoke with someone at [facility] and they are supposed to follow up with him to schedule the face to face interview but he has not heard back. BSW left voicemail message for [place] with [name] eligibility to obtain an update on this referral. BSW will follow up with Donald [name] once a return call is received.   Addendum: BSW received return call from [place] with [name] eligibility who reported that she has attempted to contact Donald [name] but did not receive a return call.  BSW provided Donald [name] with her contact information again and encouraged him to call to schedule the face to face interview.    Ronn Melena, BSW Social Worker [phone]

## 2018-02-10 NOTE — Progress Notes (Signed)
Carelink Summary Report / Loop Recorder 

## 2018-02-11 NOTE — Progress Notes (Signed)
I agree with the above plan 

## 2018-02-16 ENCOUNTER — Other Ambulatory Visit: Payer: Self-pay | Admitting: Pharmacist

## 2018-02-16 ENCOUNTER — Ambulatory Visit: Payer: Self-pay | Admitting: Pharmacist

## 2018-02-16 NOTE — Patient Outreach (Signed)
[facility]) Care Management  02/16/2018  DONNOVAN STAMOUR 1955-12-04 [id number]  Patient is a 73 yoM with PNH significant for: CAD, MDD, stroke, CHF-EF 40-45% (jan 2019).  [facility] pharmacy referral for medication management/assistance from [facility] LCSW.    Unsuccessful call attempt #1 to patient.  HIPAA compliant message left on voicemail encouraging call back.  Successful outreach call to patient's daughter (emergency contact), [name].  She manages all of his medications.  She states that the discharge med list from 01/26/18 does not match the list of medications that [facility] mail order is sending.  [facility] Pharmacist will clarify with PCP.  Per dispense report, patient had 90-day supplies of plavix, lisinopril, sertraline, lasix, lipitor, coreg, gabapentin filled via mail order after discharge  [place] [address] is back up pharmacy per daughter if RX is needed urgently.  This is due to the fact that [facility] mail order takes 7 days to process  PLAN: -I will call PCP and cardiologist to determine patient's medication list -I will f/u with daughter as neccessary to clarify med list  -Home visit once PCP clarifies the list  Regina Eck, PharmD, [place]  [phone]

## 2018-02-17 ENCOUNTER — Ambulatory Visit: Payer: Self-pay | Admitting: Pharmacist

## 2018-02-18 ENCOUNTER — Other Ambulatory Visit: Payer: Self-pay

## 2018-02-18 ENCOUNTER — Encounter: Payer: Self-pay | Admitting: Speech Pathology

## 2018-02-18 ENCOUNTER — Ambulatory Visit: Payer: Self-pay | Admitting: Pharmacist

## 2018-02-18 ENCOUNTER — Other Ambulatory Visit: Payer: Self-pay | Admitting: Pharmacist

## 2018-02-18 LAB — CUP PACEART REMOTE DEVICE CHECK
Implantable Pulse Generator Implant Date: 20190130
MDC IDC SESS DTM: 20190711201013

## 2018-02-18 NOTE — Therapy (Signed)
SPEECH THERAPY DISCHARGE SUMMARY  Visits from Start of Care: 1  Current functional level related to goals / functional outcomes: See evaluation for details; pt called to cancel and did not return for therapy.   Remaining deficits: See evaluation for details; pt called to cancel and did not return for therapy.   Education / Equipment: none Plan: Patient agrees to discharge.  Patient goals were not met. Patient is being discharged due to meeting the stated rehab goals.  ?????         SLP Short Term Goals - 02/18/18 1553      SLP SHORT TERM GOAL #1   Title  Pt will attend to simple cognitive linguistic task in quiet environment for 15 minutes with fewer than 5 cues to redirect    Status  Not Met      SLP SHORT TERM GOAL #2   Title  pt will ID errors 85% success in written cognitive linguistic tasks with min verbal cues over three sessions    Status  Not Met      SLP SHORT TERM GOAL #3   Title  pt will demo knowledge of 4 ways to compensate for memory     Status  Not Met      SLP SHORT TERM GOAL #4   Title  Pt will demo functional organizational skills in min complex cognitive linguistic tasks with rare verbal cues over three sessions     Status  Not Met      SLP SHORT TERM GOAL #5   Title  pt will demo word finding strategies in simple to mod complex conversation 85% success    Status  Not Met      SLP Long Term Goals - 02/18/18 1553      SLP LONG TERM GOAL #1   Title  pt will demo selective attention for 15 minutes of therapy tasks in a min-mod moisy environment over three sessions    Status  Not Met      SLP LONG TERM GOAL #2   Title  pt will independently utilize 2 components of a memory system at home, between three therapy sessions     Status  Not Met      SLP LONG TERM GOAL #3   Title  pt will perform simple executive function tasks with time and/or money 90% with rare min A     Status  Not Met      SLP LONG TERM GOAL #4   Title  pt will demo  compensations for word finding, functionally, in 10 minutes simple-mod complex conversation     Status  Not Met       Deneise Lever, MS, [place] [address] [place], [place], [address] Phone: [phone]   Fax:  [phone]  Patient Details  Name: Donald [name] MRN: [id number] Date of Birth: January 24, 1956 Referring Provider:  No ref. provider found  Encounter Date: 02/18/2018   Donald [name] 02/18/2018, 3:53 PM  [place] [address] [place], [place], [address] Phone: [phone]   Fax:  [phone]

## 2018-02-18 NOTE — Patient Outreach (Signed)
[facility] [facility]) Care Management  02/18/2018  Donald [name] [birth year] [id number]  Successful outreach call to [name] PCP's office, Dr. Sela [name]. Spoke with RN regarding updated medication list.  It has been confirmed that patient should be on lasix daily.  Per sure scripts records-->Lasix 20mg  daily, 90-supply was filled and delivered on 01/26/18.  Medication list has been updated in [facility] to reflect recent discharge.  Reconciliation will likely happen after office visits next week if changes to be made regarding cardiology medications  HIPAA compliant voicemail was left with [name]) encouraging call back.    PLAN: -I will f/u with patient & daughter after cardiology and PCP appointments next week to ensure medication list is updated and will plan home visit if necessary. -I will f/u cards/PCP notes to determine if additional medication reconciliation needed   Donald [name], PharmD, [place]  [phone]

## 2018-02-18 NOTE — Patient Outreach (Signed)
[place] [facility]) Care Management  02/18/2018  VINNIE GOMBERT 01-10-1956 [id number]   BSW attempted to reach Mr. Splitt to follow up on status of [name] application.  BSW left voicemail message. BSW was able to make contact with his sister, Wonda Cerise.  BSW informed Ms. Dewain Penning about application being submitted to [name] and that a face to face interview has to be conducted to determine eligibility.  BSW provided her with contact information for [facility] with [name] eligibility so that she can be contacted if Mr. Broughton has not already scheduled the interview.  BSW requested that Ms. Dewain Penning call back with an update.  BSW will follow up next week if she does not call back.  Ronn Melena, BSW Social Worker [phone]

## 2018-02-22 ENCOUNTER — Ambulatory Visit: Payer: Medicare HMO | Admitting: Cardiovascular Disease

## 2018-02-22 ENCOUNTER — Encounter: Payer: Self-pay | Admitting: Cardiovascular Disease

## 2018-02-22 ENCOUNTER — Ambulatory Visit: Payer: Self-pay

## 2018-02-22 VITALS — BP 112/52 | HR 68 | Ht 63.0 in | Wt 144.0 lb

## 2018-02-22 DIAGNOSIS — I2510 Atherosclerotic heart disease of native coronary artery without angina pectoris: Secondary | ICD-10-CM | POA: Diagnosis not present

## 2018-02-22 DIAGNOSIS — Z8673 Personal history of transient ischemic attack (TIA), and cerebral infarction without residual deficits: Secondary | ICD-10-CM | POA: Diagnosis not present

## 2018-02-22 DIAGNOSIS — Z4509 Encounter for adjustment and management of other cardiac device: Secondary | ICD-10-CM

## 2018-02-22 DIAGNOSIS — I5042 Chronic combined systolic (congestive) and diastolic (congestive) heart failure: Secondary | ICD-10-CM | POA: Diagnosis not present

## 2018-02-22 MED ORDER — FUROSEMIDE 20 MG PO TABS: 20.0000 mg | ORAL_TABLET | Freq: Every day | ORAL | 0 refills | Status: DC | PRN

## 2018-02-22 NOTE — Patient Instructions (Signed)
Dr Sallyanne Kuster has recommended making the following medication changes: 1. CHANGE the way you take Furosemide - take it as needed  Your physician recommends that you schedule a follow-up appointment in 3 months with a NP/PA.  Dr Sallyanne Kuster recommends that you schedule a follow-up appointment in 6 months. You will receive a reminder letter in the mail two months in advance. If you don't receive a letter, please call our office to schedule the follow-up appointment.  If you need a refill on your cardiac medications before your next appointment, please call your pharmacy.
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
## 2018-02-23 ENCOUNTER — Other Ambulatory Visit: Payer: Self-pay | Admitting: Pharmacist

## 2018-02-23 ENCOUNTER — Ambulatory Visit: Payer: Self-pay

## 2018-02-23 ENCOUNTER — Ambulatory Visit: Payer: Self-pay | Admitting: Pharmacist

## 2018-02-23 ENCOUNTER — Other Ambulatory Visit: Payer: Self-pay

## 2018-02-23 NOTE — Patient Outreach (Signed)
[place] [facility]) Care Management  02/23/2018  ELCHANAN BOB [birth year] [id number]   Follow up call attempt to patient's sister, Wonda Cerise, regarding [name] application and housing resources.  BSW left voicemail message.  Will follow up again within four business days.  Ronn Melena, BSW Social Worker [phone]

## 2018-02-23 NOTE — Patient Outreach (Signed)
[place] [facility]) Care Management  02/23/2018  Donald [name] 11/08/1955 [id number]   Care coordination update: Updated medication list given to daughter after cardiology visit to compare with [name] list.  Patient does have Lasix medication in the home and directions have now changed to "Take 20mg  daily as needed"  PLAN: -Will await PCP f/u visit on 8/29 to perform final medication reconciliation -Will outreach to daughter at that time  Regina Eck, PharmD, [place]  [phone]

## 2018-02-25 ENCOUNTER — Ambulatory Visit (INDEPENDENT_AMBULATORY_CARE_PROVIDER_SITE_OTHER): Payer: Medicare HMO | Admitting: Family Medicine

## 2018-02-25 ENCOUNTER — Other Ambulatory Visit: Payer: Self-pay

## 2018-02-25 ENCOUNTER — Ambulatory Visit: Payer: Self-pay

## 2018-02-25 ENCOUNTER — Encounter: Payer: Self-pay | Admitting: Family Medicine

## 2018-02-25 ENCOUNTER — Ambulatory Visit: Payer: Self-pay | Admitting: Pharmacist

## 2018-02-25 ENCOUNTER — Ambulatory Visit (INDEPENDENT_AMBULATORY_CARE_PROVIDER_SITE_OTHER): Payer: Medicare HMO | Admitting: Licensed Clinical Social Worker

## 2018-02-25 VITALS — BP 128/62 | HR 65 | Temp 98.4°F | Ht 63.0 in | Wt 144.2 lb

## 2018-02-25 DIAGNOSIS — Z23 Encounter for immunization: Secondary | ICD-10-CM | POA: Diagnosis not present

## 2018-02-25 DIAGNOSIS — F0151 Vascular dementia with behavioral disturbance: Secondary | ICD-10-CM | POA: Diagnosis not present

## 2018-02-25 DIAGNOSIS — F322 Major depressive disorder, single episode, severe without psychotic features: Secondary | ICD-10-CM | POA: Diagnosis not present

## 2018-02-25 DIAGNOSIS — I2510 Atherosclerotic heart disease of native coronary artery without angina pectoris: Secondary | ICD-10-CM | POA: Diagnosis not present

## 2018-02-25 DIAGNOSIS — N2889 Other specified disorders of kidney and ureter: Secondary | ICD-10-CM | POA: Diagnosis not present

## 2018-02-25 DIAGNOSIS — F01518 Vascular dementia, unspecified severity, with other behavioral disturbance: Secondary | ICD-10-CM

## 2018-02-25 DIAGNOSIS — Z789 Other specified health status: Secondary | ICD-10-CM

## 2018-02-25 MED ORDER — SERTRALINE HCL 25 MG PO TABS: 25.0000 mg | ORAL_TABLET | Freq: Every day | ORAL | 3 refills | Status: DC

## 2018-02-25 MED ORDER — TRAZODONE HCL 50 MG PO TABS: 50.0000 mg | ORAL_TABLET | Freq: Every evening | ORAL | 1 refills | Status: DC | PRN

## 2018-02-25 NOTE — Progress Notes (Signed)
CC: memory follow up   HPI  Socially, much has changed since our last visit. Donald [name] left patient, he experienced a suicide attempt, was hospitalizedx 2, and is now living with his sister. States he feels well, and there is "nothing you can fix." he is talking to his ex wife on the phone frequently as well.   CAD - changed to PRN lasix by Dr. Loletha Grayer this week. He didn't recall this, but sister did. We reviewed it again.   Not using a pill box, sister says he keeps his meds in a bag.   Mood - neither he nor his sister recall a follow up plan from [facility] discharge. When I reviewed d/c summ recommending Family services f/u, they don't recall this.   Renal mass - no dysuria or hematuria. No recollection of renal trouble. Previously noted as a hyperdense cyst at 59mm on 04/2017 CT. Imaging 01/16/2018 reports 1.3cm subcapsular lesion with possible contrast enhancement, suspicious for solid neoplasm.   Will ride [name] bus to appts going forward, thinks he was approved. Sister said she didn't get a message from Education officer, museum.   Wants sertraline and trazodone sent to mail order pharmacy.   ROS: Denies CP, SOB, abdominal pain, dysuria, changes in BMs.   CC, SH/smoking status, and VS noted  Objective: BP 128/62   Pulse 65   Temp 98.4 F (36.9 C) (Oral)   Ht 5\' 3"  (1.6 m)   Wt 144 lb 3.2 oz (65.4 kg)   SpO2 99%   BMI 25.54 kg/m  Gen: NAD, alert, cooperative, and pleasant. HEENT: NCAT, EOMI, PERRL CV: RRR, no murmur Resp: CTAB, no wheezes, non-labored Abd: SNTND, BS present, no guarding or organomegaly Ext: No edema, warm Neuro: Alert and oriented, Speech clear, No gross deficits  Assessment and plan:  MDD (major depressive disorder), single episode, severe , no psychosis (Donald [name]) Refilled sertraline today, gave info for family services again and gave warm handoff to our CSW to help navigate [name] transportation. Recommended that sister take him over to family services today to get established.     Coronary artery disease Again reviewed Dr. Lurline [name] recommendations to change lasix to PRN.   Vascular dementia with behavior disturbance This continues to be a hugely limiting factor in his care. He wants to ride [name] bus alone to visits, but he can't recall what he was being seen for today. Glad family is involved, and we will continue to monitor. He is overall in good spirits today.   Renal mass MRI w/ wo ordered today. They will have to schedule after reviewing his loop recorder.    Orders Placed This Encounter  Procedures  . MR ABDOMEN WWO CONTRAST    Standing Status:   Future    Standing Expiration Date:   04/28/2019    Order Specific Question:   If indicated for the ordered procedure, I authorize the administration of contrast media per Radiology protocol    Answer:   Yes    Order Specific Question:   What is the patient's sedation requirement?    Answer:   No Sedation    Order Specific Question:   Does the patient have a pacemaker or implanted devices?    Answer:   Yes    Comments:   loop recorder    Order Specific Question:   Manufacturer of pacemake or implanted device?    Answer:   [employer]    Order Specific Question:   Year of pacemaker or implanted device?  Answer:   07/29/2017    Order Specific Question:   Radiology Contrast Protocol - do NOT remove file path    Answer:   [email or web address]    Order Specific Question:   Preferred imaging location?    Answer:   [facility] (table limit-500 lbs)  . Flu Vaccine QUAD 36+ mos IM    Meds ordered this encounter  Medications  . traZODone (DESYREL) 50 MG tablet    Sig: Take 1 tablet (50 mg total) by mouth at bedtime as needed for sleep.    Dispense:  30 tablet    Refill:  1  . sertraline (ZOLOFT) 25 MG tablet    Sig: Take 1 tablet (25 mg total) by mouth daily.    Dispense:  30 tablet    Refill:  3     Donald Ok, MD, PGY3 02/25/2018 9:12 PM

## 2018-02-25 NOTE — Assessment & Plan Note (Signed)
This continues to be a hugely limiting factor in his care. He wants to ride [name] bus alone to visits, but he can't recall what he was being seen for today. Glad family is involved, and we will continue to monitor. He is overall in good spirits today.

## 2018-02-25 NOTE — Progress Notes (Signed)
Type of Service: Clinical Social Work  Social work consult from Dr. Lindell Noe reference assisting patient with resources .   LCSW spoke with patient and sister to assess the need.  The following was discussed;[name] application, Housing resources, Medicaid, Medication assistance and food benefits .   Patient and sister working with [facility] social worker to complete [name] application.  LCSW called Courtney with [name] while they were in the office.  Sister left message with contact information to schedule interview.  LCSW also reviewed other community resources with patient and sister.  Patient appreciative of talking with LCSW. Will provide update to [place] worker via [employer].  Plan:  1.Patient's sister will F/U with [name] to schedule a face to face interview.   2. Sister will  take patient to DSS to apply for food benefits,  3. Sister will also assist patient with F/U with Family services of the [place], [place] Licensed Clinical Social Worker [place]   [phone] 3:53 PM

## 2018-02-25 NOTE — Assessment & Plan Note (Signed)
Again reviewed Dr. Lurline Del recommendations to change lasix to PRN.

## 2018-02-25 NOTE — Assessment & Plan Note (Signed)
MRI w/ wo ordered today. They will have to schedule after reviewing his loop recorder.

## 2018-02-25 NOTE — Assessment & Plan Note (Signed)
Refilled sertraline today, gave info for family services again and gave warm handoff to our CSW to help navigate [name] transportation. Recommended that sister take him over to family services today to get established.

## 2018-02-25 NOTE — Patient Instructions (Addendum)
It was a pleasure to see you today! Thank you for choosing [facility] for your primary care. Donald [name] was seen for memory, depression.   Our plans for today were: > Please go to the MRI to check on the renal mass, I will call you with results.   > Please go to [place] today:  Specialty:  Medical illustrator Why:  Please attend a walk in appt between 8:30am-2:30pm, Monday-Friday.  Please request therapy and medication management services.   Contact information: [facility] [address] [place] [address] [phone]   > Call Amber below about [name].  Ronn Melena, BSW Social Worker [phone]  > Please come back and see Dr. Lindell Noe in 1 months with ALL OF YOUR MEDICINES.   Best,  Dr. Lindell Noe

## 2018-02-26 ENCOUNTER — Ambulatory Visit: Payer: Self-pay | Admitting: Pharmacist

## 2018-03-04 ENCOUNTER — Other Ambulatory Visit: Payer: Self-pay

## 2018-03-04 ENCOUNTER — Ambulatory Visit: Payer: Self-pay | Admitting: Pharmacist

## 2018-03-04 NOTE — Patient Outreach (Addendum)
[place] [facility]) Care Management  03/04/2018  Donald [name] 08-22-1955 [id number]  Follow up call to patient's sister, Wonda Cerise, regarding social work referral for transportation and housing resources.  [name] reported that [name] is scheduled for his face to face interview with [name] on 03/11/18.  BSW talked with [name] about patient's previous request for housing resources and informed her that they were mailed to him on 01/21/18 and he confirmed receipt.  [name] requested that these resources be mailed again and confirmed that she could assist with navigating him through these resources .  Mailed again today. BSW will follow up next week regarding determination of [name] eligibility and to ensure receipt of resources resent.  Ronn Melena, BSW Social Worker [phone]

## 2018-03-04 NOTE — Patient Outreach (Signed)
[facility]) Care Management   03/04/2018  Donald [name] 03/17/1956 [id number]  TAIVEN GREENLEY is an 62 y.o. male  Subjective: "I just want to get better because I want my own place".  Objective:  BP 118/68   Pulse 78   Resp 20   Ht 1.6 m (5' 3" ) Comment: patient reported  Wt 141 lb (64 kg) Comment: patient reported.  SpO2 97%   BMI 24.98 kg/m   Review of Systems  Respiratory: Negative for shortness of breath.        Lung sounds clear, decreased breath sounds.  Cardiovascular: Negative for leg swelling.       S1S2 noted, regular    Physical Exam skin warm dry, color within normal limits  Encounter Medications:   Outpatient Encounter Medications as of 03/04/2018  Medication Sig Note  . acetaminophen (TYLENOL) 325 MG tablet Take by mouth. 03/04/2018: Reports takes prn  . aspirin 81 MG tablet Take 1 tablet (81 mg total) by mouth daily.   Marland Kitchen atorvastatin (LIPITOR) 40 MG tablet TAKE 1 TABLET EVERY DAY   . calcium carbonate (TUMS) 500 MG chewable tablet as needed.   . carvedilol (COREG) 3.125 MG tablet Take 3.125 mg by mouth 2 (two) times daily with a meal.   . furosemide (LASIX) 20 MG tablet Take 1 tablet (20 mg total) by mouth daily as needed for fluid or edema.   . gabapentin (NEURONTIN) 100 MG capsule TAKE 1 CAPSULE TWICE DAILY   . niacin 250 MG tablet Take 250 mg by mouth daily.   . nitroGLYCERIN (NITROSTAT) 0.4 MG SL tablet Place 0.4 mg under the tongue every 5 (five) minutes as needed for chest pain. May take up to 3 doses. 01/27/2018: Patient needs a new Rx for this if he is to stay on it (he has no more)  . ranitidine (ZANTAC) 150 MG tablet Take 1 tablet (150 mg total) by mouth daily.   . sertraline (ZOLOFT) 25 MG tablet Take 1 tablet (25 mg total) by mouth daily. 03/04/2018: Has 50 mg tablets. He reports he takes 50 mg daily.  . tamsulosin (FLOMAX) 0.4 MG CAPS capsule TAKE 1 CAPSULE EVERY DAY   . traZODone (DESYREL) 50 MG tablet Take 1 tablet (50 mg  total) by mouth at bedtime as needed for sleep.   . vitamin B-12 (CYANOCOBALAMIN) 1000 MCG tablet Take 1,000 mcg by mouth daily.    No facility-administered encounter medications on file as of 03/04/2018.     Functional Status:   In your present state of health, do you have any difficulty performing the following activities: 02/04/2018 01/17/2018  Hearing? N N  Comment - -  Vision? N N  Comment - -  Difficulty concentrating or making decisions? N N  Walking or climbing stairs? N N  Dressing or bathing? N N  Doing errands, shopping? N N  Preparing Food and eating ? N -  Using the Toilet? N -  In the past six months, have you accidently leaked urine? N -  Do you have problems with loss of bowel control? N -  Managing your Medications? Y -  Managing your Finances? N -  Housekeeping or managing your Housekeeping? N -  Some encounter information is confidential and restricted. Go to Review Flowsheets activity to see all data.  Some recent data might be hidden    Fall/Depression Screening:    Fall Risk  03/04/2018 02/08/2018 02/04/2018  Falls in the past year? No No No  Number falls in past yr: - - -   PHQ 2/9 Scores 02/25/2018 02/04/2018 12/28/2017 12/07/2017 10/06/2017 06/10/2017 06/10/2017  PHQ - 2 Score 0 0 0 0 1 3 0  PHQ- 9 Score - - - - - 18 -    Assessment: Primary care provider Referral. Per telephonic care coordination patient is a fall risk with history of vertigo, stroke 2018, peripheral sight loss in right eye, refuses to use cane or walker.  RNCM completed follow up visit. Client reports he has been doing better. He states he has an appointment today with a provider, but he is unable to states who and what type of provider. He states, "they are checking out some spot to see if it is cancer". Client reports his sister is taking him today.Has attended other scheduled office visits. Client reports his sister is supportive and provides transportation. Client currently lives with his sister.    Client reports he has an appointment with [name] next week and that his sister is going to take him.  Medications reviewed-no questions or concerns noted.  Knowledge deficit regarding management of Heart failure education. RNCM discussed transition to health coach for continued education after next month follow up.  Plan: continue heart failure education. Telephonic follow up next month. THN CM Care Plan Problem One     Most Recent Value  Care Plan Problem One  at risk for readmisstion  Role Documenting the Problem One  Care Management [place] for Problem One  Active  [facility] Long Term Goal   client will not be readmitted within the next 31 days.  THN Long Term Goal Start Date  01/25/18  THN Long Term Goal Met Date  03/04/18  [facility] CM Short Term Goal #1   client will take medications as prescribed within the next 30 days.  THN CM Short Term Goal #1 Start Date  01/25/18  [facility] CM Short Term Goal #1 Met Date  03/04/18  THN CM Short Term Goal #2   client will follow up with provider visits as scheduled within the next 30 days.  THN CM Short Term Goal #2 Start Date  01/25/18  [facility] CM Short Term Goal #2 Met Date  03/04/18    [facility] CM Care Plan Problem Two     Most Recent Value  Care Plan Problem Two  knowledge deficit related to heart failure management as evidence by clients request for more education.  Role Documenting the Problem Two  Care Management Coordinator  Care Plan for Problem Two  Active  Interventions for Problem Two Long Term Goal   assigned EMMI videos, viewed with client EMMI video regarding weights and the importance of recording weights, reviewed the Heart failure zone tool  THN Long Term Goal  client will express heart failure management strategies within the next 31-60 days.  THN Long Term Goal Start Date  02/04/18  [facility] CM Short Term Goal #1   client will report reviewing the heart failure zone tool within the next 30 days.  THN CM Short Term Goal #1 Start Date   02/04/18  THN CM Short Term Goal #1 Met Date   03/04/18  THN CM Short Term Goal #2   client will begin recording daily weights within the next 30 days.  THN CM Short Term Goal #2 Start Date  03/04/18  Interventions for Short Term Goal #2  assigned EMMI video, viewed EMMI weights video.     Thea Silversmith, RN, MSN, [place]  Coordinator Cell: [phone]

## 2018-03-05 ENCOUNTER — Ambulatory Visit: Payer: Self-pay | Admitting: Pharmacist

## 2018-03-05 ENCOUNTER — Other Ambulatory Visit: Payer: Self-pay | Admitting: Pharmacist

## 2018-03-05 NOTE — Patient Outreach (Addendum)
[facility] [facility]) Care Management  03/05/2018  MALEK SKOG 06-24-1956 [id number]  Final review of medications with patient's daughter who is in charge of Mr. Jesus medications.  HIPAA identifiers verified.  Per [place] home visit on 03/04/18, medications were reviewed and family had no questions or concerns.  [facility] Pharmacist reviewed medication list with patient's daughter multiple times and resolved list after two recent doctors visits.  No other issues or concerns.  Up to date medication list was given to daughter and current in [facility].  Plan:  -[place] will close case.  I am happy to assist in the future as needed -Discipline case closure letter routed & [place] Team Members notified  Regina Eck, PharmD, [place]  [phone]

## 2018-03-09 ENCOUNTER — Ambulatory Visit (HOSPITAL_COMMUNITY): Payer: Medicare HMO

## 2018-03-09 ENCOUNTER — Other Ambulatory Visit (HOSPITAL_COMMUNITY): Payer: Self-pay

## 2018-03-12 ENCOUNTER — Ambulatory Visit: Payer: Self-pay

## 2018-03-15 ENCOUNTER — Other Ambulatory Visit: Payer: Self-pay

## 2018-03-15 ENCOUNTER — Ambulatory Visit (INDEPENDENT_AMBULATORY_CARE_PROVIDER_SITE_OTHER): Payer: Medicare HMO | Admitting: *Deleted

## 2018-03-15 DIAGNOSIS — I6300 Cerebral infarction due to thrombosis of unspecified precerebral artery: Secondary | ICD-10-CM

## 2018-03-15 NOTE — Progress Notes (Signed)
Carelink Summary Report / Loop Recorder 

## 2018-03-15 NOTE — Patient Outreach (Signed)
[place] [facility]) Care Management  03/15/2018  Donald [name] [birth year] [id number]   BSW attempted to contact Donald [name] but had to leave a message.  BSW was able to connect with his sister, [name].  She reported that the [name] interview had to be rescheduled for 03/23/18.  BSW inquired about receipt of housing resources that were mailed again on 03/04/18.  She was unsure if they were received and said that she would check through [name] mail today.  BSW encouraged her to call if resources need to be mailed again.  BSW will follow up next week after [name] interview to determine eligibility decision.  Ronn Melena, BSW Social Worker [phone]

## 2018-03-18 ENCOUNTER — Ambulatory Visit (HOSPITAL_COMMUNITY): Admission: RE | Admit: 2018-03-18 | Payer: Medicare HMO | Source: Ambulatory Visit

## 2018-03-18 LAB — CUP PACEART REMOTE DEVICE CHECK
Date Time Interrogation Session: 20190813204105
Implantable Pulse Generator Implant Date: 20190130

## 2018-03-24 ENCOUNTER — Ambulatory Visit: Payer: Self-pay

## 2018-03-25 ENCOUNTER — Other Ambulatory Visit: Payer: Self-pay

## 2018-03-25 NOTE — Patient Outreach (Signed)
Donald [name]) Care Management  03/25/2018  Donald [name] 1955-07-21 [id number]   BSW contacted patient's sister, Wonda Cerise, regarding status of [name] services and to ensure receipt of housing resources sent for a second time on 03/04/18.  Mr. Clock was approved for [name] services and can begin utilizing it now.  Ms. Dewain Penning confirmed receipt of housing resources.  BSW is closing case at this time due to no other social work needs being identified.    Ronn Melena, BSW Social Worker [phone]

## 2018-03-28 LAB — CUP PACEART REMOTE DEVICE CHECK
Date Time Interrogation Session: 20190915213616
MDC IDC PG IMPLANT DT: 20190130

## 2018-03-30 ENCOUNTER — Encounter: Payer: Self-pay | Admitting: Family Medicine

## 2018-03-31 ENCOUNTER — Other Ambulatory Visit: Payer: Self-pay | Admitting: Cardiovascular Disease

## 2018-04-01 ENCOUNTER — Other Ambulatory Visit: Payer: Self-pay

## 2018-04-01 NOTE — Telephone Encounter (Signed)
[id number] D/C AT HOSP D/C 12-14-2017. IS PT STILL TAKING?

## 2018-04-01 NOTE — Patient Outreach (Signed)
[facility]) Care Management  04/01/2018  JANET DECESARE [birth year] [id number]  Subjective: "I feel pretty good other than numbness in my leg every once in a while."   Assessment: 62 year old with history of CAD, cerebral aneurysm, stroke, major depressive disorder.  RNCM called client for follow up. Client reports he is doing well. He reports  He adds that he was having pain in his legs, was started on Neurontin and no longer has pain, only some intermittent feelings of numbness in his legs. Client states he would like to increase activity and plans to call primary care regarding increasing activity/cardiac rehab.  Client report he is taking his medications every day as scheduled. He denies edema; denies shortness of breath. RNCM discussed transition to  Plan: transition to health coach for continued education/disease management of heart failure.  THN CM Care Plan Problem One     Most Recent Value  Care Plan Problem One  at risk for readmisstion  Role Documenting the Problem One  Care Management [place] for Problem One  Not Active  [facility] Long Term Goal   client will not be readmitted within the next 31 days.  THN Long Term Goal Start Date  01/25/18  THN Long Term Goal Met Date  03/04/18  [facility] CM Short Term Goal #1   client will take medications as prescribed within the next 30 days.  THN CM Short Term Goal #1 Start Date  01/25/18  [facility] CM Short Term Goal #1 Met Date  03/04/18  THN CM Short Term Goal #2   client will follow up with provider visits as scheduled within the next 30 days.  THN CM Short Term Goal #2 Start Date  01/25/18  [facility] CM Short Term Goal #2 Met Date  03/04/18    [facility] CM Care Plan Problem Two     Most Recent Value  Care Plan Problem Two  knowledge deficit related to heart failure management as evidence by clients request for more education.  Role Documenting the Problem Two  Care Management Coordinator  Care Plan for Problem Two   Active  Interventions for Problem Two Long Term Goal   RNCM reinforced the importance of monitoring and recording weights particularly since the lasix is ordered as needed.  [place] Term Goal  client will express heart failure management strategies within the next 31-60 days.  THN Long Term Goal Start Date  04/01/18 [continued]  THN CM Short Term Goal #1   client will report reviewing the heart failure zone tool within the next 30 days.  THN CM Short Term Goal #1 Start Date  02/04/18  THN CM Short Term Goal #1 Met Date   03/04/18  THN CM Short Term Goal #2   client will begin recording daily weights within the next 30 days.  THN CM Short Term Goal #2 Start Date  04/01/18 [continue]  Interventions for Short Term Goal #2  reinforced the importance weighing self, referred client to the [facility] calender/organizer as a place where he can record weights.     Thea Silversmith, RN, MSN, [place] Coordinator Cell: [phone]

## 2018-04-02 ENCOUNTER — Other Ambulatory Visit: Payer: Self-pay

## 2018-04-02 NOTE — Patient Outreach (Signed)
[facility]) Care Management  04/02/2018  Donald [name] 1956-02-07 [id number]  RNCM received request to return call to client. Mr. Bisping asked about housing resources. RNCM informed client that [facility] social worker, Chiropractor mailed housing resources to his sister. Mr.Brouillet states he would follow up with his sister. Referral to Health Coach completed yesterday for continued disease management.  Thea Silversmith, RN, MSN, [place] Coordinator Cell: [phone]

## 2018-04-06 NOTE — Telephone Encounter (Signed)
I have a hard time knowing what meds Donald [name] is taking at home, and he has some vascular dementia 2/2 his CVAs. He wasn't sure last time he was here, and he does tend toward hypotension. It looks like [place] reviewed his meds and lisinopril was not present on that list. I would not refill without seeing him or doing a RN med check.

## 2018-04-13 ENCOUNTER — Other Ambulatory Visit: Payer: Self-pay | Admitting: Licensed Clinical Social Worker

## 2018-04-13 ENCOUNTER — Other Ambulatory Visit: Payer: Self-pay

## 2018-04-13 NOTE — Patient Outreach (Signed)
[facility]) Care Management  04/13/2018  Donald [name] 14-Nov-1955 [id number]    [place] received message that the patient would like a call to discuss help for housing.The patient states that his sister is gone during the day from 6 am to 8 pm at night.   It is late for her to help him with his search.  I explained to the patient to the process that would happen in finding him housing.  I explained that I will send a referral to social work to see what options they might be able to help him with. He stated that he was ok with that.  I also explained to him that if he has anymore questions about housing to write them down and ask when they contact him.  He stated that he understood.   Plan:  [place] will outreach the patient at the next scheduled interval.  [place], BSN, [place]:  [phone]  Fax: [phone]

## 2018-04-13 NOTE — Patient Outreach (Signed)
[place] [facility]) Care Management  04/13/2018  Donald [name] 07-26-1955 [id number]  Assessment-CSW completed outreach attempt today. CSW unable to reach patient successfully. CSW left a HIPPA compliant voice message encouraging patient to return call once available.  Plan-CSW will await return call or complete an additional outreach if needed.  Eula Fried, BSW, MSW, [email or web address] Phone: [phone] Fax: [phone]

## 2018-04-16 ENCOUNTER — Ambulatory Visit (INDEPENDENT_AMBULATORY_CARE_PROVIDER_SITE_OTHER): Payer: 59 | Admitting: *Deleted

## 2018-04-16 DIAGNOSIS — I6300 Cerebral infarction due to thrombosis of unspecified precerebral artery: Secondary | ICD-10-CM | POA: Diagnosis not present

## 2018-04-19 ENCOUNTER — Other Ambulatory Visit: Payer: Self-pay | Admitting: Licensed Clinical Social Worker

## 2018-04-19 NOTE — Progress Notes (Signed)
Carelink Summary Report / Loop Recorder 

## 2018-04-19 NOTE — Patient Outreach (Signed)
[facility]) Care Management  04/19/2018  KWALI WRINKLE Sep 18, 1955 [id number]  Assessment-CSW completed second outreach attempt today to patient's sister on 04/19/18. CSW unable to reach patient's sister successfully. CSW left a HIPPA compliant voice message encouraging patient to return call once available. THN CSW will send an unsuccessful outreach letter to patient at this time and will complete third and final outreach attempt within two weeks.   Plan-CSW will await return call or complete an additional outreach if needed.  Eula Fried, BSW, MSW, [email or web address] Phone: [phone] Fax: [phone]

## 2018-04-19 NOTE — Patient Outreach (Signed)
  [facility] [facility]) Care Management  04/19/2018  Donald [name] 02/01/1956 [id number]  [facility] CSW received incoming return call from patient's sister [name]. HIPPA verifications successfully received. THN CSW introduced self, reason for call and of THN social work services. Sister reports that she is patient's primary caregiver but that she works full-time. Sister shares that she is aware that patient is wanting to relocate to a residence of his own so that he can be on his own. Sister reports that she can assist patient with finding a residence after resources are provided. Sister reports when CSW asked if she felt like patient could live alone, "Yes, I think he can. He has made a lot of progress lately and can do more for himself now. He is really good at taking his own medications. He uses [name] on his own and even used the service last week to run errands by himself." Sister is agreeable to scheduling a joint home visit with patient, herself, THN CSW and [place] to assist with community resource needs. THN CSW completed call to [place] and then contacted patient's sister back but was unable to schedule home visit successfully as [place] CSW was unable to reach her. THN CSW will await for return call and then will update THN BSW.  Eula Fried, BSW, MSW, [email or web address] Phone: [phone] Fax: [phone]

## 2018-04-20 ENCOUNTER — Other Ambulatory Visit: Payer: Self-pay | Admitting: Licensed Clinical Social Worker

## 2018-04-20 NOTE — Patient Outreach (Signed)
[facility]) Care Management  04/20/2018  KEVONTE VANECEK [birth year] [id number]  [facility] CSW did not receive a return call/text from patient's caregiver/sister about scheduling initial home visit. [facility] CSW completed outreach call on 04/20/18 and was able to reach patient's sister successfully. Joint home visit was successfully scheduled for 04/26/18 at 2:00 pm. [facility] CSW updated [place].   Eula Fried, BSW, MSW, [email or web address] Phone: [phone] Fax: [phone]

## 2018-04-21 ENCOUNTER — Ambulatory Visit (INDEPENDENT_AMBULATORY_CARE_PROVIDER_SITE_OTHER): Payer: 59 | Admitting: Family Medicine

## 2018-04-21 ENCOUNTER — Encounter: Payer: Self-pay | Admitting: Family Medicine

## 2018-04-21 ENCOUNTER — Other Ambulatory Visit: Payer: Self-pay

## 2018-04-21 VITALS — BP 122/60 | HR 95 | Temp 98.5°F | Ht 63.0 in | Wt 150.8 lb

## 2018-04-21 DIAGNOSIS — R159 Full incontinence of feces: Secondary | ICD-10-CM

## 2018-04-21 LAB — HEMOCCULT GUIAC POC 1CARD (OFFICE): FECAL OCCULT BLD: NEGATIVE

## 2018-04-21 NOTE — Progress Notes (Signed)
   CC: bowel incontinence  HPI  Bowel incontinence - a couple of weeks. Just started randomly. Can no longer go to walk for exercise due to this. About a couple of times per week. This used to happen when he had C diff in the past. Doesn't smell particularly bad. No blood in BMs. No abd pain or pain around anus. No warning at all.   Wt Readings from Last 3 Encounters:  04/21/18 150 lb 12.8 oz (68.4 kg)  03/04/18 141 lb (64 kg)  02/25/18 144 lb 3.2 oz (65.4 kg)   ROS: Denies CP, SOB, abdominal pain, dysuria, changes in BMs.   CC, SH/smoking status, and VS noted  Objective: BP 122/60   Pulse 95   Temp 98.5 F (36.9 C) (Oral)   Ht 5\' 3"  (1.6 m)   Wt 150 lb 12.8 oz (68.4 kg)   SpO2 96%   BMI 26.71 kg/m  Gen: NAD, alert, cooperative, and pleasant. HEENT: NCAT, EOMI, PERRL CV: RRR, no murmur Resp: CTAB, no wheezes, non-labored Abd: SNTND, BS present, no guarding or organomegaly GU: normal skin surrounding anus, anal wink present. Normal tone on DRE.  Ext: No edema, warm Neuro: Alert and oriented, Speech clear, No gross deficits  Assessment and plan:  1. Full incontinence of feces Unclear etiology. Retest for C diff given hx. Exam without abnormality. Given he is in need of colonoscopy for screening as well, will get GIPP and re refer to GI. FOBT neg.  - Ambulatory referral to Gastroenterology - POCT occult blood stool - GI Profile, Stool, PCR   Ralene Ok, MD, PGY3 04/23/2018 11:30 AM

## 2018-04-21 NOTE — Patient Instructions (Signed)
It was a pleasure to see you today! Thank you for choosing [facility] for your primary care. Donald [name] was seen for fecal incontinence.   Our plans for today were:  Please bring the stool sample back.   We sent a referral to the GI doctor for both colonoscopy and workup.   Best,  Dr. Lindell Noe

## 2018-04-22 DIAGNOSIS — R159 Full incontinence of feces: Secondary | ICD-10-CM | POA: Diagnosis not present

## 2018-04-23 ENCOUNTER — Ambulatory Visit: Payer: Self-pay

## 2018-04-23 ENCOUNTER — Other Ambulatory Visit: Payer: Self-pay | Admitting: Family Medicine

## 2018-04-24 LAB — GI PROFILE, STOOL, PCR
ADENOVIRUS F 40/41: NOT DETECTED
ASTROVIRUS: NOT DETECTED
C DIFFICILE TOXIN A/B: NOT DETECTED
CAMPYLOBACTER: NOT DETECTED
Cryptosporidium: NOT DETECTED
Cyclospora cayetanensis: NOT DETECTED
ENTAMOEBA HISTOLYTICA: NOT DETECTED
ENTEROPATHOGENIC E COLI: NOT DETECTED
Enteroaggregative E coli: NOT DETECTED
Enterotoxigenic E coli: NOT DETECTED
GIARDIA LAMBLIA: NOT DETECTED
Norovirus GI/GII: NOT DETECTED
Plesiomonas shigelloides: NOT DETECTED
Rotavirus A: NOT DETECTED
SAPOVIRUS: NOT DETECTED
Salmonella: NOT DETECTED
Shiga-toxin-producing E coli: NOT DETECTED
Shigella/Enteroinvasive E coli: NOT DETECTED
VIBRIO CHOLERAE: NOT DETECTED
VIBRIO: NOT DETECTED
YERSINIA ENTEROCOLITICA: NOT DETECTED

## 2018-04-26 ENCOUNTER — Telehealth: Payer: Self-pay | Admitting: *Deleted

## 2018-04-26 ENCOUNTER — Other Ambulatory Visit: Payer: Self-pay | Admitting: Licensed Clinical Social Worker

## 2018-04-26 NOTE — Telephone Encounter (Signed)
Pt informed of below. Zimmerman Rumple, [name], CMA  

## 2018-04-26 NOTE — Telephone Encounter (Signed)
-----   Message from [name], MD sent at 04/26/2018 11:11 AM EDT ----- Dema Severin team, please let Donald [name] know that his stool studies did not show any infection to cause the diarrhea and he should continue with GI referral (they shouldcall him). Call us if it is worsening or changing.

## 2018-04-26 NOTE — Patient Outreach (Addendum)
[facility]) Care Management  [facility] Social Work  04/26/2018  FABYAN LOUGHMILLER 08-Feb-1956 [id number]  Encounter Medications:  Outpatient Encounter Medications as of 04/26/2018  Medication Sig Note  . acetaminophen (TYLENOL) 325 MG tablet Take by mouth. 03/04/2018: Reports takes prn  . aspirin 81 MG tablet Take 1 tablet (81 mg total) by mouth daily.   Marland Kitchen atorvastatin (LIPITOR) 40 MG tablet TAKE 1 TABLET EVERY DAY   . calcium carbonate (TUMS) 500 MG chewable tablet as needed.   . carvedilol (COREG) 3.125 MG tablet Take 3.125 mg by mouth 2 (two) times daily with a meal.   . furosemide (LASIX) 20 MG tablet Take 1 tablet (20 mg total) by mouth daily as needed for fluid or edema.   . gabapentin (NEURONTIN) 100 MG capsule TAKE 1 CAPSULE TWICE DAILY   . niacin 250 MG tablet Take 250 mg by mouth daily.   . nitroGLYCERIN (NITROSTAT) 0.4 MG SL tablet Place 0.4 mg under the tongue every 5 (five) minutes as needed for chest pain. May take up to 3 doses. 01/27/2018: Patient needs a new Rx for this if he is to stay on it (he has no more)  . ranitidine (ZANTAC) 150 MG tablet Take 1 tablet (150 mg total) by mouth daily.   . sertraline (ZOLOFT) 25 MG tablet Take 1 tablet (25 mg total) by mouth daily. 03/04/2018: Has 50 mg tablets. He reports he takes 50 mg daily.  . tamsulosin (FLOMAX) 0.4 MG CAPS capsule TAKE 1 CAPSULE EVERY DAY   . traZODone (DESYREL) 50 MG tablet TAKE 1 TABLET (50 MG TOTAL) BY MOUTH AT BEDTIME AS NEEDED FOR SLEEP.   Marland Kitchen vitamin B-12 (CYANOCOBALAMIN) 1000 MCG tablet Take 1,000 mcg by mouth daily.    No facility-administered encounter medications on file as of 04/26/2018.     Functional Status:  In your present state of health, do you have any difficulty performing the following activities: 02/04/2018 01/17/2018  Hearing? N N  Comment - -  Vision? N N  Comment - -  Difficulty concentrating or making decisions? N N  Walking or climbing stairs? N N  Dressing or bathing? N N   Doing errands, shopping? N N  Preparing Food and eating ? N -  Using the Toilet? N -  In the past six months, have you accidently leaked urine? N -  Do you have problems with loss of bowel control? N -  Managing your Medications? Y -  Managing your Finances? N -  Housekeeping or managing your Housekeeping? N -  Some encounter information is confidential and restricted. Go to Review Flowsheets activity to see all data.  Some recent data might be hidden    Fall/Depression Screening:  PHQ 2/9 Scores 04/21/2018 02/25/2018 02/04/2018 12/28/2017 12/07/2017 10/06/2017 06/10/2017  PHQ - 2 Score 0 0 0 0 0 1 3  PHQ- 9 Score - - - - - - 18    Assessment: [facility] CSW successfully completed joint home visit on 04/26/18 with [place], patient and patient's caregiver/sister [name]. Patient reports that he is able to get around the community more independently now that he has [name] services in place. [facility] CSW provided education on available senior socialization opportunities as patient would like to decrease isolation and gain new friendships. Patient was educated on [employer] and their various services and he has agreed to follow up with program. Contact information provided to family. Patient reports that he is wanting to move out of  his sister's apartment and relocate somewhere so that he can live alone and independently. Patient was provided education on available housing resources within the area. Patient is interested in getting placed on the Section 8 and [place] wait list. [facility] CSW and BSW successfully placed patient on the wait list for both programs. Patient understands that he will need to keep an eye out for future mail and emails from [employer]. Patient understands that these wait list are up to a year long wait (if not more.) Patient expresses interest in finding a house before then and was provided education on available low income and senior housing  properties. Patient is agreeable to contact [employer] as well as to go to [email or web address] to research available properties so that he can put in his specific filter requests. Patient was provided resource education on available dental and denture resources as well. Contact information provided to patient. Patient denies any further social work needs at this time and is agreeable to social work discharge at this time. [facility] CSW and BSW will both close case at this time.  Plan: [facility] CSW will close case and sign off now that patient has been successfully placed on the wait list for Section 8 and Public Housing.  Eula Fried, BSW, MSW, [email or web address] Phone: [phone] Fax: [phone]

## 2018-04-27 ENCOUNTER — Ambulatory Visit (HOSPITAL_COMMUNITY): Payer: 59

## 2018-05-04 LAB — CUP PACEART REMOTE DEVICE CHECK
Implantable Pulse Generator Implant Date: 20190130
MDC IDC SESS DTM: 20191018213832

## 2018-05-10 ENCOUNTER — Ambulatory Visit (HOSPITAL_COMMUNITY): Payer: Medicare HMO

## 2018-05-17 ENCOUNTER — Other Ambulatory Visit: Payer: Self-pay | Admitting: Cardiovascular Disease

## 2018-05-18 ENCOUNTER — Other Ambulatory Visit: Payer: Self-pay

## 2018-05-18 NOTE — Patient Outreach (Signed)
[facility]) Care Management  05/18/2018  Donald [name] 08/23/1955 [id number]   1st outreach attempt to the patient for initial assessment. No answer.  HIPAA compliant voice mail left with contact information.  Plan: [place] will make outreach attempt the patient within in one month.  Lazaro Arms RN, BSN, [place] Direct Dial:  [phone]  Fax: [phone]

## 2018-05-19 ENCOUNTER — Ambulatory Visit (INDEPENDENT_AMBULATORY_CARE_PROVIDER_SITE_OTHER): Payer: Medicare HMO

## 2018-05-19 DIAGNOSIS — I6300 Cerebral infarction due to thrombosis of unspecified precerebral artery: Secondary | ICD-10-CM | POA: Diagnosis not present

## 2018-05-20 NOTE — Progress Notes (Signed)
Carelink Summary Report / Loop Recorder 

## 2018-05-25 ENCOUNTER — Encounter: Payer: Self-pay | Admitting: Gastroenterology

## 2018-06-01 ENCOUNTER — Other Ambulatory Visit: Payer: Self-pay

## 2018-06-01 NOTE — Patient Outreach (Signed)
[name]) Care Management  06/01/2018  Donald [name] January 10, 1956 [id number]    2nd outreach to the patient for initial assessment. No answer.  HIPAA compliant voicemail left with contact information.  Plan: RN Health Coach will send letter. Donald [name] will make outreach attempt the patient within in one month.  Donald Arms RN, BSN, [place] Direct Dial:  [phone]  Fax: [phone]

## 2018-06-02 ENCOUNTER — Other Ambulatory Visit: Payer: Self-pay | Admitting: Family Medicine

## 2018-06-03 ENCOUNTER — Telehealth: Payer: Self-pay | Admitting: Gastroenterology

## 2018-06-03 ENCOUNTER — Encounter: Payer: Self-pay | Admitting: Gastroenterology

## 2018-06-03 ENCOUNTER — Ambulatory Visit: Payer: Medicare HMO | Admitting: Gastroenterology

## 2018-06-03 VITALS — BP 104/50 | HR 76 | Ht 63.0 in | Wt 148.2 lb

## 2018-06-03 DIAGNOSIS — K219 Gastro-esophageal reflux disease without esophagitis: Secondary | ICD-10-CM | POA: Diagnosis not present

## 2018-06-03 DIAGNOSIS — R159 Full incontinence of feces: Secondary | ICD-10-CM | POA: Diagnosis not present

## 2018-06-03 DIAGNOSIS — R1310 Dysphagia, unspecified: Secondary | ICD-10-CM | POA: Diagnosis not present

## 2018-06-03 DIAGNOSIS — R93429 Abnormal radiologic findings on diagnostic imaging of unspecified kidney: Secondary | ICD-10-CM | POA: Diagnosis not present

## 2018-06-03 MED ORDER — OMEPRAZOLE 40 MG PO CPDR: 40.0000 mg | DELAYED_RELEASE_CAPSULE | Freq: Every day | ORAL | 1 refills | Status: DC

## 2018-06-03 MED ORDER — SUPREP BOWEL PREP KIT 17.5-3.13-1.6 GM/177ML PO SOLN: ORAL | 0 refills | Status: DC

## 2018-06-03 MED ORDER — PSYLLIUM 58.6 % PO POWD: ORAL | 12 refills | Status: DC

## 2018-06-03 NOTE — Telephone Encounter (Signed)
Mailed another copy of AVS and colon instructions to pt.

## 2018-06-03 NOTE — Patient Instructions (Addendum)
If you are age 63 or older, your body mass index should be between 23-30. Your Body mass index is 26.26 kg/m. If this is out of the aforementioned range listed, please consider follow up with your Primary Care Provider.  If you are age 8 or younger, your body mass index should be between 19-25. Your Body mass index is 26.26 kg/m. If this is out of the aformentioned range listed, please consider follow up with your Primary Care Provider.   You have been scheduled for an endoscopy and colonoscopy. Please follow the written instructions given to you at your visit today. Please pick up your prep supplies at the pharmacy within the next 1-3 days. If you use inhalers (even only as needed), please bring them with you on the day of your procedure. Your physician has requested that you go to www.startemmi.com and enter the access code given to you at your visit today. This web site gives a general overview about your procedure. However, you should still follow specific instructions given to you by our office regarding your preparation for the procedure.  Discontinue Zantac.  We have sent the following medications to your pharmacy for you to pick up at your convenience: Omeprazole 40mg : Take once a day.  Take a daily fiber supplement such as Metamucil.  You have been scheduled for an MRI at [facility], located at [place]. Lawrence Santiago in the [facility]. Your appointment is scheduled on Friday, June 11, 2018 at 8:00am. Please arrive 30 minutes prior to your appointment time for registration purposes. Please make certain not to have anything to eat or drink 6 hours prior to your test. In addition, if you have any metal in your body, have a pacemaker or defibrillator, please be sure to let your ordering physician know. This test typically takes 45 minutes to 1 hour to complete. Should you need to reschedule, please call [phone].  Thank you for entrusting me with your care and for choosing  [facility], Dr. [place] Cellar
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
## 2018-06-10 ENCOUNTER — Encounter: Payer: Self-pay | Admitting: Family Medicine

## 2018-06-11 ENCOUNTER — Ambulatory Visit (HOSPITAL_COMMUNITY): Admission: RE | Admit: 2018-06-11 | Payer: Medicare HMO | Source: Ambulatory Visit

## 2018-06-14 ENCOUNTER — Telehealth: Payer: Self-pay | Admitting: Family Medicine

## 2018-06-14 NOTE — Telephone Encounter (Signed)
RN team, could you please call Donald [name]'s sister (he has some vascular dementia 2/2 strokes and there seems to be some confusion about getting him to his MRI). Could you ask if there is something we need to do on the order end of things (he has a pacemaker, but has gotten MRIs before and that was in the original order) or if it has been canceled because he missed it again because he forgot? Maybe she could consider calling him or picking him up on the day of the MRI. See her mychart message below.   Hello this is Donald [name] sister. I need someone to call me back at [phone]. His MRI keeps getting canceled he had a appointment 06/11/18 and they cancelled it again this is the 3rd time. How can I get this done for him. He needs this done. He keeps saying he is hurting. So someone needs to call me back.    Thanks  Donald [name] ( sister)

## 2018-06-14 NOTE — Telephone Encounter (Signed)
Will forward to white team. [name], RN [facility] RN)

## 2018-06-15 NOTE — Telephone Encounter (Signed)
Spoke to [place] at [place]. Kennyth Lose is trying to get the MRI approved. Insurance has denied the MRI twice. Called and spoke to [place], pt's sister. She is aware we are working to get the MRI approved. Ottis Stain, CMA

## 2018-06-15 NOTE — Telephone Encounter (Signed)
This encounter was created in error - please disregard.

## 2018-06-18 ENCOUNTER — Ambulatory Visit (HOSPITAL_COMMUNITY)
Admission: RE | Admit: 2018-06-18 | Discharge: 2018-06-18 | Disposition: A | Discharge status: Home / Self Care | Payer: Medicare HMO | Source: Ambulatory Visit | Attending: Family Medicine | Admitting: Family Medicine

## 2018-06-18 DIAGNOSIS — N281 Cyst of kidney, acquired: Secondary | ICD-10-CM | POA: Insufficient documentation

## 2018-06-18 DIAGNOSIS — Z9049 Acquired absence of other specified parts of digestive tract: Secondary | ICD-10-CM | POA: Insufficient documentation

## 2018-06-18 DIAGNOSIS — N2889 Other specified disorders of kidney and ureter: Secondary | ICD-10-CM | POA: Diagnosis not present

## 2018-06-18 LAB — POCT I-STAT CREATININE: CREATININE: 1.6 mg/dL — AB (ref 0.61–1.24)

## 2018-06-18 MED ORDER — GADOBUTROL 1 MMOL/ML IV SOLN
7.0000 mL | Freq: Once | INTRAVENOUS | Status: AC | PRN
  Administered 2018-06-18: 7 mL via INTRAVENOUS

## 2018-06-21 ENCOUNTER — Encounter: Payer: Self-pay | Admitting: Gastroenterology

## 2018-06-21 ENCOUNTER — Ambulatory Visit (INDEPENDENT_AMBULATORY_CARE_PROVIDER_SITE_OTHER): Payer: Medicare HMO

## 2018-06-21 DIAGNOSIS — I6300 Cerebral infarction due to thrombosis of unspecified precerebral artery: Secondary | ICD-10-CM | POA: Diagnosis not present

## 2018-06-22 LAB — CUP PACEART REMOTE DEVICE CHECK
Date Time Interrogation Session: 20191223224001
Implantable Pulse Generator Implant Date: 20190130

## 2018-06-22 NOTE — Progress Notes (Signed)
Carelink Summary Report / Loop Recorder 

## 2018-06-29 ENCOUNTER — Other Ambulatory Visit: Payer: Self-pay | Admitting: Cardiovascular Disease

## 2018-06-29 ENCOUNTER — Encounter: Payer: Self-pay | Admitting: Gastroenterology

## 2018-06-29 ENCOUNTER — Ambulatory Visit (AMBULATORY_SURGERY_CENTER): Payer: Medicare HMO | Admitting: Gastroenterology

## 2018-06-29 VITALS — BP 120/51 | HR 72 | Temp 97.8°F | Resp 12 | Ht 63.0 in | Wt 148.0 lb

## 2018-06-29 DIAGNOSIS — K5730 Diverticulosis of large intestine without perforation or abscess without bleeding: Secondary | ICD-10-CM | POA: Diagnosis not present

## 2018-06-29 DIAGNOSIS — K2980 Duodenitis without bleeding: Secondary | ICD-10-CM | POA: Diagnosis not present

## 2018-06-29 DIAGNOSIS — K529 Noninfective gastroenteritis and colitis, unspecified: Secondary | ICD-10-CM | POA: Diagnosis not present

## 2018-06-29 DIAGNOSIS — R1310 Dysphagia, unspecified: Secondary | ICD-10-CM | POA: Diagnosis not present

## 2018-06-29 DIAGNOSIS — D122 Benign neoplasm of ascending colon: Secondary | ICD-10-CM | POA: Diagnosis not present

## 2018-06-29 DIAGNOSIS — R159 Full incontinence of feces: Secondary | ICD-10-CM | POA: Diagnosis not present

## 2018-06-29 DIAGNOSIS — R69 Illness, unspecified: Secondary | ICD-10-CM | POA: Diagnosis not present

## 2018-06-29 DIAGNOSIS — D120 Benign neoplasm of cecum: Secondary | ICD-10-CM

## 2018-06-29 MED ORDER — SODIUM CHLORIDE 0.9 % IV SOLN: 500.0000 mL | Freq: Once | INTRAVENOUS | Status: DC

## 2018-06-29 NOTE — Patient Instructions (Signed)
Handouts:  Post dilation diet, diverticulosis, polyps  YOU HAD AN ENDOSCOPIC PROCEDURE TODAY AT [facility]:   Refer to the procedure report that was given to you for any specific questions about what was found during the examination.  If the procedure report does not answer your questions, please call your gastroenterologist to clarify.  If you requested that your care partner not be given the details of your procedure findings, then the procedure report has been included in a sealed envelope for you to review at your convenience later.  YOU SHOULD EXPECT: Some feelings of bloating in the abdomen. Passage of more gas than usual.  Walking can help get rid of the air that was put into your GI tract during the procedure and reduce the bloating. If you had a lower endoscopy (such as a colonoscopy or flexible sigmoidoscopy) you may notice spotting of blood in your stool or on the toilet paper. If you underwent a bowel prep for your procedure, you may not have a normal bowel movement for a few days.  Please Note:  You might notice some irritation and congestion in your nose or some drainage.  This is from the oxygen used during your procedure.  There is no need for concern and it should clear up in a day or so.  SYMPTOMS TO REPORT IMMEDIATELY:   Following lower endoscopy (colonoscopy or flexible sigmoidoscopy):  Excessive amounts of blood in the stool  Significant tenderness or worsening of abdominal pains  Swelling of the abdomen that is new, acute  Fever of 100F or higher   Following upper endoscopy (EGD)  Vomiting of blood or coffee ground material  New chest pain or pain under the shoulder blades  Painful or persistently difficult swallowing  New shortness of breath  Fever of 100F or higher  Black, tarry-looking stools  For urgent or emergent issues, a gastroenterologist can be reached at any hour by calling [phone].   DIET:  We do recommend a small meal at  first, but then you may proceed to your regular diet.  Drink plenty of fluids but you should avoid alcoholic beverages for 24 hours.  ACTIVITY:  You should plan to take it easy for the rest of today and you should NOT DRIVE or use heavy machinery until tomorrow (because of the sedation medicines used during the test).    FOLLOW UP: Our staff will call the number listed on your records the next business day following your procedure to check on you and address any questions or concerns that you may have regarding the information given to you following your procedure. If we do not reach you, we will leave a message.  However, if you are feeling well and you are not experiencing any problems, there is no need to return our call.  We will assume that you have returned to your regular daily activities without incident.  If any biopsies were taken you will be contacted by phone or by letter within the next 1-3 weeks.  Please call us at [phone] if you have not heard about the biopsies in 3 weeks.    SIGNATURES/CONFIDENTIALITY: You and/or your care partner have signed paperwork which will be entered into your electronic medical record.  These signatures attest to the fact that that the information above on your After Visit Summary has been reviewed and is understood.  Full responsibility of the confidentiality of this discharge information lies with you and/or your care-partner.

## 2018-06-29 NOTE — Op Note (Signed)
Donald [name] Procedure Date: 06/29/2018 12:10 PM MRN: [id number] Endoscopist: Remo Lipps P. Havery Moros , MD Age: 62 Referring MD:  Date of Birth: [birth year] Gender: Male Account #: [id number] Procedure:                Colonoscopy Indications:              Fecal incontinence, loose stools Medicines:                Monitored Anesthesia Care Procedure:                Pre-Anesthesia Assessment:                           - Prior to the procedure, a History and Physical                            was performed, and patient medications and                            allergies were reviewed. The patient's tolerance of                            previous anesthesia was also reviewed. The risks                            and benefits of the procedure and the sedation                            options and risks were discussed with the patient.                            All questions were answered, and informed consent                            was obtained. Prior Anticoagulants: The patient has                            taken no previous anticoagulant or antiplatelet                            agents. ASA Grade Assessment: III - A patient with                            severe systemic disease. After reviewing the risks                            and benefits, the patient was deemed in                            satisfactory condition to undergo the procedure.                           After obtaining informed consent, the colonoscope  was passed under direct vision. Throughout the                            procedure, the patient's blood pressure, pulse, and                            oxygen saturations were monitored continuously. The                            Colonoscope was introduced through the anus and                            advanced to the the terminal ileum, with                            identification of the  appendiceal orifice and IC                            valve. The colonoscopy was performed without                            difficulty. The patient tolerated the procedure                            well. The quality of the bowel preparation was                            good. The terminal ileum, ileocecal valve,                            appendiceal orifice, and rectum were photographed. Scope In: 12:31:26 PM Scope Out: 12:53:06 PM Scope Withdrawal Time: 0 hours 17 minutes 13 seconds  Total Procedure Duration: 0 hours 21 minutes 40 seconds  Findings:                 The perianal and digital rectal examinations were                            normal.                           The terminal ileum appeared normal.                           A 5 mm polyp was found in the cecum. The polyp was                            sessile. The polyp was removed with a cold snare.                            Resection and retrieval were complete.                           Two sessile polyps were found in the ascending  colon. The polyps were 5 to 7 mm in size. These                            polyps were removed with a cold snare. Resection                            and retrieval were complete.                           One benign appearing nodule / ? polypoid lesion,                            roughly 4-8mm in size was found in the distal                            rectum approximating the dentate line. Biopsies                            were taken with a cold forceps for histology.                           Multiple small-mouthed diverticula were found in                            the sigmoid colon.                           The exam was otherwise without abnormality. No                            overt inflammatory changes.                           Biopsies for histology were taken with a cold                            forceps from the right colon, left colon and                             transverse colon for evaluation of microscopic                            colitis. Complications:            No immediate complications. Estimated blood loss:                            None. Estimated Blood Loss:     Estimated blood loss was minimal. Impression:               - The examined portion of the ileum was normal.                           - One 5 mm polyp in the cecum, removed with a cold  snare. Resected and retrieved.                           - Two 5 to 7 mm polyps in the ascending colon,                            removed with a cold snare. Resected and retrieved.                           - Nodule / ? polypoid lesion in the distal rectum                            approximating the dentate line. Biopsied.                           - Diverticulosis in the sigmoid colon.                           - The examination was otherwise normal.                           - Biopsies were taken with a cold forceps from the                            right colon, left colon and transverse colon for                            evaluation of microscopic colitis. Recommendation:           - Patient has a contact number available for                            emergencies. The signs and symptoms of potential                            delayed complications were discussed with the                            patient. Return to normal activities tomorrow.                            Written discharge instructions were provided to the                            patient.                           - Resume previous diet.                           - Continue present medications.                           - Await pathology results. Remo Lipps P. Mirel Hundal, MD 06/29/2018 1:00:27 PM This report has been signed electronically.

## 2018-06-29 NOTE — Op Note (Addendum)
[place] Patient Name: Donald [name] Procedure Date: 06/29/2018 12:10 PM MRN: [id number] Endoscopist: Remo Lipps P. Havery Moros , MD Age: 62 Referring MD:  Date of Birth: [birth year] Gender: Male Account #: [id number] Procedure:                Upper GI endoscopy Indications:              Dysphagia, history of GERD - reflux improved with                            omeprazole, dysphagia persists, also with chronic                            loose stools / incontinence Medicines:                Monitored Anesthesia Care Procedure:                Pre-Anesthesia Assessment:                           - Prior to the procedure, a History and Physical                            was performed, and patient medications and                            allergies were reviewed. The patient's tolerance of                            previous anesthesia was also reviewed. The risks                            and benefits of the procedure and the sedation                            options and risks were discussed with the patient.                            All questions were answered, and informed consent                            was obtained. Prior Anticoagulants: The patient has                            taken no previous anticoagulant or antiplatelet                            agents. ASA Grade Assessment: III - A patient with                            severe systemic disease. After reviewing the risks                            and benefits, the patient was deemed in  satisfactory condition to undergo the procedure.                           After obtaining informed consent, the endoscope was                            passed under direct vision. Throughout the                            procedure, the patient's blood pressure, pulse, and                            oxygen saturations were monitored continuously. The                            Endoscope was  introduced through the mouth, and                            advanced to the second part of duodenum. The upper                            GI endoscopy was accomplished without difficulty.                            The patient tolerated the procedure well. Scope In: Scope Out: Findings:                 Esophagogastric landmarks were identified: the                            Z-line was found at 39 cm, the gastroesophageal                            junction was found at 39 cm and the upper extent of                            the gastric folds was found at 39 cm from the                            incisors.                           The exam of the esophagus was otherwise normal.                           A guidewire was placed and the scope was withdrawn.                            Dilation was performed in the entire esophagus with                            a Savary dilator. Initially this was attempted with  62m Savory however resistance was met at the                            oropharynx and dilator was withdrawn and not                            passed. Dilation was then performed successfully                            with mild resistance at 15 mm and then 16 mm.                            Relook endoscopy showed no mucosal wrents.                           The entire examined stomach was normal. Of note,                            the pylorus was normal (I thought a photo was taken                            but it was not in error)                           The duodenal bulb and second portion of the                            duodenum were normal. Biopsies for histology were                            taken with a cold forceps for evaluation of celiac                            disease. Complications:            No immediate complications. Estimated blood loss:                            Minimal. Estimated Blood Loss:     Estimated blood loss was  minimal. Impression:               - Esophagogastric landmarks identified.                           - Normal esophagus - empiric dilation performed to                            166mas above.                           - Normal stomach.                           - Normal duodenal bulb and second portion of the  duodenum. Biopsied to rule out celiac disease. Recommendation:           - Patient has a contact number available for                            emergencies. The signs and symptoms of potential                            delayed complications were discussed with the                            patient. Return to normal activities tomorrow.                            Written discharge instructions were provided to the                            patient.                           - Resume previous diet. Await course post-dilation                           - Continue present medications.                           - Await pathology results. Remo Lipps P. Eulogio Requena, MD 06/29/2018 1:05:48 PM This report has been signed electronically.

## 2018-06-29 NOTE — Progress Notes (Signed)
Called to room to assist during endoscopic procedure.  Patient ID and intended procedure confirmed with present staff. Received instructions for my participation in the procedure from the performing physician.  

## 2018-06-29 NOTE — Progress Notes (Signed)
Spontaneous respirations throughout. VSS. Resting comfortably. To PACU on room air. Report to  RN. 

## 2018-07-01 ENCOUNTER — Telehealth: Payer: Self-pay

## 2018-07-01 NOTE — Telephone Encounter (Signed)
  Follow up Call-  Call back number 06/29/2018  Post procedure Call Back phone  # [phone]  Permission to leave phone message Yes  Some recent data might be hidden     Left message

## 2018-07-01 NOTE — Telephone Encounter (Signed)
  Follow up Call-  Call back number 06/29/2018  Post procedure Call Back phone  # [phone]  Permission to leave phone message Yes  Some recent data might be hidden     Left Message

## 2018-07-01 NOTE — Telephone Encounter (Signed)
Pt returned your call and stated to be feeling fine.

## 2018-07-02 ENCOUNTER — Other Ambulatory Visit: Payer: Self-pay

## 2018-07-02 NOTE — Patient Outreach (Signed)
[facility]) Care Management  07/02/2018  Donald [name] April 27, 1956 [id number]    3rd outreach attempt to the patient for initial assessment.  No answer. HIPAA compliant voicemail left with contact information.  Plan: 3rd outreach attempt has been made. If no response to calls and letter in ten business days [place] will proceed with case closure.   Lazaro Arms RN, BSN, [place] Direct Dial:  [phone]  Fax: [phone]

## 2018-07-04 LAB — CUP PACEART REMOTE DEVICE CHECK
Date Time Interrogation Session: 20191120213649
Implantable Pulse Generator Implant Date: 20190130

## 2018-07-07 ENCOUNTER — Telehealth: Payer: Self-pay

## 2018-07-07 NOTE — Telephone Encounter (Signed)
-----   Message from Yetta Flock, MD sent at 07/07/2018 11:57 AM EST ----- Jan can you help relay the following:  - EGD performed along with esophageal dilation, I hope that helped his dysphagia - esophagus otherwise normal, he should continue omeprazole for reflux - biopsies of the small bowel showed mild nonspecific inflammation but I do not think concerning, negative for celiac - 3 pre-cancerous polyps removed, recommend recall colonoscopy in 3 years - benign small polypoid lesion in the rectum, biopsies not concerning - biopsies negative for microscopic colitis  I had recommended he take a daily fiber supplementation, I hope that helps. If stools still loose, he can try some immodium. If incontinence persists he should let me know, may refer to pelvic floor PT. Thanks

## 2018-07-07 NOTE — Telephone Encounter (Signed)
Called and spoke to patient. He asked me to relay results to his wife.  All findings and recommendations were relayed to pt's wife. She indicated that his dysphagia has improved "a little" after dilation.  He knows to continue Omeprazole. He has not started the daily fiber supplement yet but will do so and will let us know if his loose stool does not improve and he may try imodium.  Recall colon placed for 06-2021.

## 2018-07-23 ENCOUNTER — Other Ambulatory Visit: Payer: Self-pay | Admitting: Family Medicine

## 2018-07-26 ENCOUNTER — Ambulatory Visit (INDEPENDENT_AMBULATORY_CARE_PROVIDER_SITE_OTHER): Payer: Medicare HMO

## 2018-07-26 DIAGNOSIS — I6300 Cerebral infarction due to thrombosis of unspecified precerebral artery: Secondary | ICD-10-CM

## 2018-07-27 LAB — CUP PACEART REMOTE DEVICE CHECK
Date Time Interrogation Session: 20200125230813
MDC IDC PG IMPLANT DT: 20190130

## 2018-07-27 NOTE — Progress Notes (Signed)
Carelink Summary Report / Loop Recorder 

## 2018-08-11 ENCOUNTER — Ambulatory Visit: Payer: Medicare HMO | Admitting: Adult Health

## 2018-08-26 ENCOUNTER — Ambulatory Visit (INDEPENDENT_AMBULATORY_CARE_PROVIDER_SITE_OTHER): Payer: Medicare HMO | Admitting: *Deleted

## 2018-08-26 DIAGNOSIS — I6300 Cerebral infarction due to thrombosis of unspecified precerebral artery: Secondary | ICD-10-CM | POA: Diagnosis not present

## 2018-08-27 LAB — CUP PACEART REMOTE DEVICE CHECK
Date Time Interrogation Session: 20200227231126
Implantable Pulse Generator Implant Date: 20190130

## 2018-09-01 NOTE — Progress Notes (Signed)
Carelink Summary Report / Loop Recorder 

## 2018-09-08 ENCOUNTER — Other Ambulatory Visit: Payer: Self-pay

## 2018-09-08 NOTE — Patient Outreach (Signed)
[facility]) Care Management  09/08/2018  Donald [name] December 14, 1955 [id number]    Late entry   Multiple attempts to establish contact with patient without success. No response from calls or letter mailed to patient. Case is being closed at this time.  Lazaro Arms RN, BSN, [place] Direct Dial:  [phone]  Fax: [phone]

## 2018-09-22 ENCOUNTER — Ambulatory Visit: Payer: Medicare HMO | Admitting: Adult Health

## 2018-09-24 ENCOUNTER — Other Ambulatory Visit: Payer: Self-pay | Admitting: Cardiovascular Disease

## 2018-09-24 ENCOUNTER — Other Ambulatory Visit: Payer: Self-pay | Admitting: Family Medicine

## 2018-09-27 NOTE — Telephone Encounter (Signed)
Refill lisinopril 2.5 mg #180 w 1 refill.

## 2018-09-28 ENCOUNTER — Ambulatory Visit (INDEPENDENT_AMBULATORY_CARE_PROVIDER_SITE_OTHER): Payer: Medicare HMO | Admitting: *Deleted

## 2018-09-28 ENCOUNTER — Other Ambulatory Visit: Payer: Self-pay

## 2018-09-28 DIAGNOSIS — I6300 Cerebral infarction due to thrombosis of unspecified precerebral artery: Secondary | ICD-10-CM | POA: Diagnosis not present

## 2018-09-29 LAB — CUP PACEART REMOTE DEVICE CHECK
Implantable Pulse Generator Implant Date: 20190130
MDC IDC SESS DTM: 20200331233911

## 2018-10-05 NOTE — Progress Notes (Signed)
Carelink Summary Report / Loop Recorder 

## 2018-11-01 ENCOUNTER — Other Ambulatory Visit: Payer: Self-pay

## 2018-11-01 ENCOUNTER — Ambulatory Visit (INDEPENDENT_AMBULATORY_CARE_PROVIDER_SITE_OTHER): Payer: Medicare HMO | Admitting: *Deleted

## 2018-11-01 DIAGNOSIS — I6300 Cerebral infarction due to thrombosis of unspecified precerebral artery: Secondary | ICD-10-CM | POA: Diagnosis not present

## 2018-11-01 LAB — CUP PACEART REMOTE DEVICE CHECK
Date Time Interrogation Session: 20200503234045
Implantable Pulse Generator Implant Date: 20190130

## 2018-11-08 NOTE — Progress Notes (Signed)
Carelink Summary Report / Loop Recorder 

## 2018-12-03 ENCOUNTER — Ambulatory Visit (INDEPENDENT_AMBULATORY_CARE_PROVIDER_SITE_OTHER): Payer: Medicare HMO | Admitting: *Deleted

## 2018-12-03 DIAGNOSIS — I6300 Cerebral infarction due to thrombosis of unspecified precerebral artery: Secondary | ICD-10-CM | POA: Diagnosis not present

## 2018-12-05 LAB — CUP PACEART REMOTE DEVICE CHECK
Date Time Interrogation Session: 20200606000823
Implantable Pulse Generator Implant Date: 20190130

## 2018-12-10 NOTE — Progress Notes (Signed)
Carelink Summary Report / Loop Recorder 

## 2019-01-05 ENCOUNTER — Ambulatory Visit (INDEPENDENT_AMBULATORY_CARE_PROVIDER_SITE_OTHER): Payer: Medicare HMO | Admitting: *Deleted

## 2019-01-05 DIAGNOSIS — I6300 Cerebral infarction due to thrombosis of unspecified precerebral artery: Secondary | ICD-10-CM | POA: Diagnosis not present

## 2019-01-06 LAB — CUP PACEART REMOTE DEVICE CHECK
Date Time Interrogation Session: 20200709001133
Implantable Pulse Generator Implant Date: 20190130

## 2019-01-17 NOTE — Progress Notes (Signed)
Carelink Summary Report / Loop Recorder 

## 2019-02-07 ENCOUNTER — Ambulatory Visit (INDEPENDENT_AMBULATORY_CARE_PROVIDER_SITE_OTHER): Payer: Medicare HMO | Admitting: *Deleted

## 2019-02-07 DIAGNOSIS — I2510 Atherosclerotic heart disease of native coronary artery without angina pectoris: Secondary | ICD-10-CM | POA: Diagnosis not present

## 2019-02-07 LAB — CUP PACEART REMOTE DEVICE CHECK
Date Time Interrogation Session: 20200810220211
Implantable Pulse Generator Implant Date: 20190130

## 2019-02-14 ENCOUNTER — Telehealth: Payer: Self-pay

## 2019-02-14 NOTE — Telephone Encounter (Signed)
Unable to LMOVM. Calling to request manual transmission and to assess if pt is symptomatic. LINQ tachy and AF alerts. Available ECGs suggest AF w/ RVR. No hx of AF and no OAC noted. Will forward to Dr. Sallyanne Kuster for review.

## 2019-02-15 NOTE — Progress Notes (Signed)
Carelink Summary Report / Loop Recorder 

## 2019-02-16 NOTE — Telephone Encounter (Signed)
Left a message for the patient to call back.  

## 2019-02-16 NOTE — Telephone Encounter (Signed)
The patient's sister called back stating that the patient does not live there anymore. She stated that she would call the patient and have him call the office since his number was not working. She is not sure if she will be able to get in touch with him.

## 2019-02-16 NOTE — Telephone Encounter (Signed)
Convincing evidence of a couple of episodes of true atrial fibrillation in the last week, one lasting 2 hours, the other 1 hour. History of stroke. Needs to have an appointment to initiate anticoagulation (Eliquis preferred). He is due his yearly appt., also. APP OK.

## 2019-02-25 ENCOUNTER — Telehealth: Payer: Self-pay | Admitting: Cardiovascular Disease

## 2019-02-25 NOTE — Telephone Encounter (Signed)
Called patient, but, had a busy signal.

## 2019-02-25 NOTE — Telephone Encounter (Signed)
Attempted to reach patient at additional phone numbers listed in [place] notes and on old [place]-- [phone] is out of service (beeps), [phone] rang continuously, no answer.  Spoke with [facility]--they have phone number listed as [phone]. Called this number, went straight to VM. VM is for "[facility]". Did not leave message.

## 2019-02-28 ENCOUNTER — Telehealth: Payer: Self-pay | Admitting: Cardiovascular Disease

## 2019-02-28 NOTE — Telephone Encounter (Signed)
Called patient but phone was busy.

## 2019-03-14 ENCOUNTER — Ambulatory Visit (INDEPENDENT_AMBULATORY_CARE_PROVIDER_SITE_OTHER): Payer: Medicare HMO | Admitting: *Deleted

## 2019-03-14 DIAGNOSIS — I639 Cerebral infarction, unspecified: Secondary | ICD-10-CM | POA: Diagnosis not present

## 2019-03-14 LAB — CUP PACEART REMOTE DEVICE CHECK
Date Time Interrogation Session: 20200913000750
Implantable Pulse Generator Implant Date: 20190130

## 2019-03-23 ENCOUNTER — Telehealth: Payer: Self-pay | Admitting: Emergency Medicine

## 2019-03-23 NOTE — Telephone Encounter (Signed)
Patient # has busy signal. Sister contacted per [facility] and she stated patient has moved and she thinks has a new cardiologist. Family will contact patient and have him call the office. DC number provided. Stressed importance of continuation of care for patient and transfer of remote transmissions if he has new Dr.

## 2019-03-25 NOTE — Progress Notes (Signed)
Carelink Summary Report / Loop Recorder 

## 2019-04-14 ENCOUNTER — Ambulatory Visit (INDEPENDENT_AMBULATORY_CARE_PROVIDER_SITE_OTHER): Payer: Medicare HMO | Admitting: *Deleted

## 2019-04-14 DIAGNOSIS — I671 Cerebral aneurysm, nonruptured: Secondary | ICD-10-CM | POA: Diagnosis not present

## 2019-04-16 LAB — CUP PACEART REMOTE DEVICE CHECK
Date Time Interrogation Session: 20201016000906
Implantable Pulse Generator Implant Date: 20190130

## 2019-04-18 ENCOUNTER — Encounter: Payer: Self-pay | Admitting: *Deleted

## 2019-04-18 NOTE — Progress Notes (Signed)
Letter re-written due to error.

## 2019-04-25 ENCOUNTER — Encounter: Payer: Self-pay | Admitting: *Deleted

## 2019-04-28 NOTE — Progress Notes (Signed)
Carelink Summary Report / Loop Recorder 

## 2019-05-17 ENCOUNTER — Ambulatory Visit (INDEPENDENT_AMBULATORY_CARE_PROVIDER_SITE_OTHER): Payer: Medicare HMO | Admitting: *Deleted

## 2019-05-17 DIAGNOSIS — I639 Cerebral infarction, unspecified: Secondary | ICD-10-CM | POA: Diagnosis not present

## 2019-05-17 DIAGNOSIS — I5042 Chronic combined systolic (congestive) and diastolic (congestive) heart failure: Secondary | ICD-10-CM

## 2019-05-18 LAB — CUP PACEART REMOTE DEVICE CHECK
Date Time Interrogation Session: 20201118000847
Implantable Pulse Generator Implant Date: 20190130

## 2019-06-07 ENCOUNTER — Telehealth: Payer: Self-pay | Admitting: Emergency Medicine

## 2019-06-07 NOTE — Telephone Encounter (Signed)
Left message with sister per D[facility] for patint to call device clinic and # provided.

## 2019-06-13 NOTE — Progress Notes (Signed)
Carelink Summary Report / Loop Recorder 

## 2019-06-20 ENCOUNTER — Ambulatory Visit (INDEPENDENT_AMBULATORY_CARE_PROVIDER_SITE_OTHER): Payer: Medicare HMO | Admitting: *Deleted

## 2019-06-20 DIAGNOSIS — Z8673 Personal history of transient ischemic attack (TIA), and cerebral infarction without residual deficits: Secondary | ICD-10-CM

## 2019-06-20 LAB — CUP PACEART REMOTE DEVICE CHECK
Date Time Interrogation Session: 20201220191447
Implantable Pulse Generator Implant Date: 20190130

## 2019-07-25 ENCOUNTER — Ambulatory Visit (INDEPENDENT_AMBULATORY_CARE_PROVIDER_SITE_OTHER): Payer: Medicare HMO | Admitting: *Deleted

## 2019-07-25 DIAGNOSIS — Z8673 Personal history of transient ischemic attack (TIA), and cerebral infarction without residual deficits: Secondary | ICD-10-CM

## 2019-07-25 LAB — CUP PACEART REMOTE DEVICE CHECK
Date Time Interrogation Session: 20210124232732
Implantable Pulse Generator Implant Date: 20190130

## 2019-08-29 ENCOUNTER — Ambulatory Visit (INDEPENDENT_AMBULATORY_CARE_PROVIDER_SITE_OTHER): Payer: Medicare HMO | Admitting: *Deleted

## 2019-08-29 DIAGNOSIS — Z8673 Personal history of transient ischemic attack (TIA), and cerebral infarction without residual deficits: Secondary | ICD-10-CM | POA: Diagnosis not present

## 2019-09-05 LAB — CUP PACEART REMOTE DEVICE CHECK
Date Time Interrogation Session: 20210228231401
Implantable Pulse Generator Implant Date: 20190130

## 2019-10-03 ENCOUNTER — Ambulatory Visit (INDEPENDENT_AMBULATORY_CARE_PROVIDER_SITE_OTHER): Payer: Medicare HMO | Admitting: *Deleted

## 2019-10-03 DIAGNOSIS — Z8673 Personal history of transient ischemic attack (TIA), and cerebral infarction without residual deficits: Secondary | ICD-10-CM

## 2019-10-04 LAB — CUP PACEART REMOTE DEVICE CHECK
Date Time Interrogation Session: 20210401001848
Implantable Pulse Generator Implant Date: 20190130

## 2019-11-02 ENCOUNTER — Telehealth: Payer: Self-pay

## 2019-11-02 NOTE — Telephone Encounter (Signed)
Unable to speak with patient to inform of disconnected monitor. °

## 2019-11-03 LAB — CUP PACEART REMOTE DEVICE CHECK
Date Time Interrogation Session: 20210502002752
Implantable Pulse Generator Implant Date: 20190130

## 2019-11-07 ENCOUNTER — Ambulatory Visit (INDEPENDENT_AMBULATORY_CARE_PROVIDER_SITE_OTHER): Payer: Medicare HMO | Admitting: *Deleted

## 2019-11-07 DIAGNOSIS — I639 Cerebral infarction, unspecified: Secondary | ICD-10-CM

## 2019-11-07 NOTE — Progress Notes (Signed)
Carelink Summary Report / Loop Recorder 

## 2019-12-13 ENCOUNTER — Telehealth: Payer: Self-pay | Admitting: *Deleted

## 2019-12-13 NOTE — Telephone Encounter (Signed)
Confirmed with operator at [facility] that pt does follow with EP in that office. LMOVM for Jerrye Beavers in the [facility], advised pt has been released in [facility] to their clinic. Direct number and office hours provided.

## 2019-12-13 NOTE — Telephone Encounter (Signed)
[employer] is sending the pt a new handheld.

## 2019-12-13 NOTE — Telephone Encounter (Signed)
The pt is now being followed by [facility]. They are a [facility] affiliated clinic. Their phone number is [phone].

## 2019-12-13 NOTE — Telephone Encounter (Signed)
LINQ alert received for 3 pause episodes and 1 AF episode. Attempted all numbers on chart, pt's numbers are out of service. LMOVM for [name]) requesting call back from pt. Direct number and office hours provided.  Reviewed notes from [facility] in [place], able to find another phone number. Able to reach pt. Updated number on chart. Attempted to assist with manual LINQ transmission, but monitor needs troubleshooting. Call transferred to [facility], [place], for further assistance with troubleshooting. Monitor requires a new handheld reader, which should arrive in 7-10 business days. Per pt, he has transferred care to Dr. Mathis Bud and Dr. Casper Harrison, but he is unsure who will be following his LINQ. Pt and family member aware that we will release remote monitoring per his request. Will contact both offices to determine who will follow remote monitoring long term. Pt and family member appreciative of assistance.    Spoke with Aniceto Boss, Dr. Martie Round nurse (pt's primary cardiologis). She does not think their office follows any LINQs. Advised I will call Dr. Margurite Auerbach office. LMOVM for [facility] requesting call back. Direct number and office hours provided.

## 2019-12-13 NOTE — Telephone Encounter (Signed)
Thank you for all the work you had to put into that

## 2019-12-14 NOTE — Telephone Encounter (Signed)
[facility] transfer accepted by [facility]. LMOVM for pt to make him aware. DC phone number provided for questions or concerns.  Marked inactive in [facility]. Recalls/appointments deleted.

## 2020-04-03 IMAGING — MR MR HEAD W/O CM
10 of 11 series · 43 of 48 positions shown · non-contrast
Comparison: MRI brain 06/11/2017.

CLINICAL DATA: Progressive confusion. Altered level of
consciousness. Previous infarct.

EXAM:
MRI HEAD WITHOUT CONTRAST
TECHNIQUE: Multiplanar, multiecho pulse sequences of the brain and surrounding
structures were obtained without intravenous contrast.

[Series 5: ax dwi_tracew · axial · 3.0mm · 1.50mm/px · z∈[-22,+123]mm · 8 of 80 slices shown]
[im 1/80]
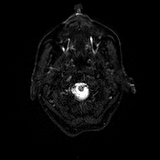
[im 12/80]
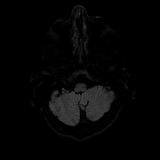
[im 23/80]
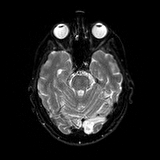
[im 34/80]
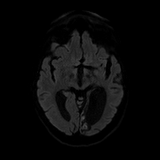
[im 46/80]
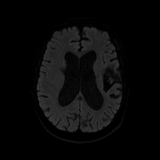
[im 57/80]
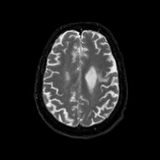
[im 68/80]
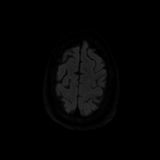
[im 80/80]
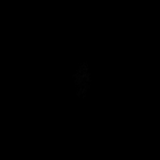

[Series 6: ax dwi_adc · axial · 3.0mm · 1.50mm/px · z∈[-22,+123]mm · 4 of 40 slices shown]
[im 1/40]
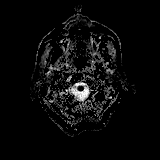
[im 14/40]
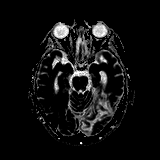
[im 27/40]
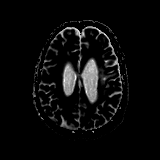
[im 40/40]
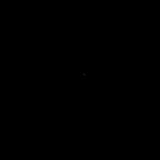

[Series 7: cor dwi_tracew · coronal · 5.0mm · 1.44mm/px · 7 of 64 slices shown]
[im 1/64]
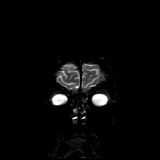
[im 11/64]
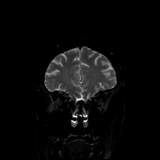
[im 22/64]
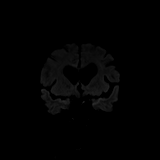
[im 32/64]
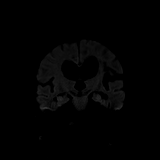
[im 43/64]
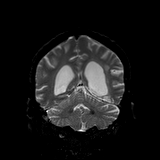
[im 53/64]
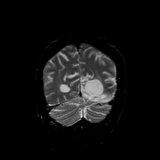
[im 64/64]
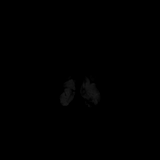

[Series 8: cor dwi_adc · coronal · 5.0mm · 1.44mm/px · 3 of 32 slices shown]
[im 1/32]
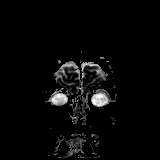
[im 16/32]
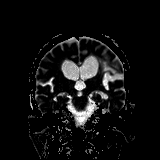
[im 32/32]
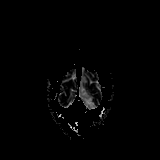

[Series 9: T1 · sagittal · 5.0mm · 0.75mm/px · 2 of 23 slices shown]
[im 1/23]
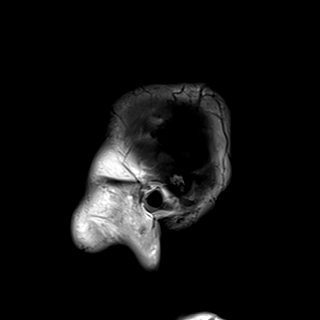
[im 23/23]
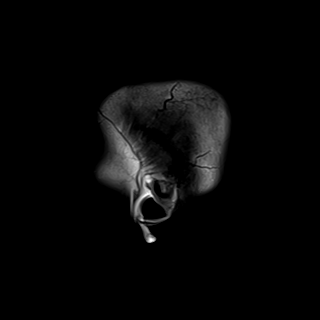

[Series 10: T2 · axial · 5.0mm · 0.69mm/px · z∈[-20,+123]mm · 3 of 25 slices shown (1 of 2)]
[im 1/25]
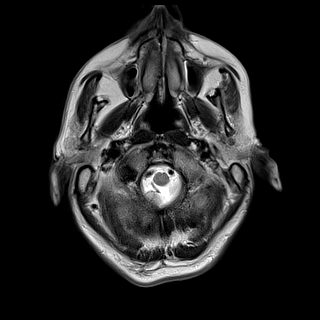
[im 13/25]
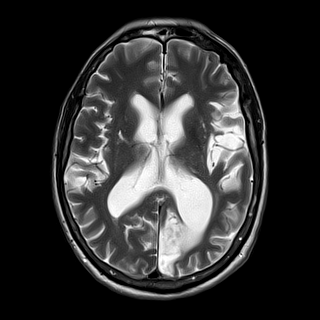
[im 25/25]
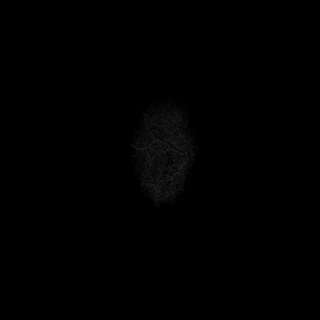

[Series 11: FLAIR · axial · 5.0mm · 0.43mm/px · z∈[-20,+124]mm · 3 of 25 slices shown]
[im 1/25]
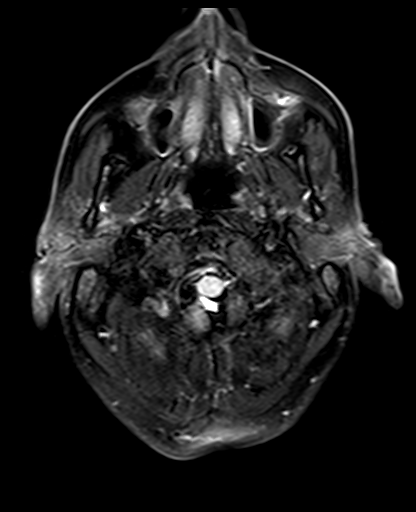
[im 13/25]
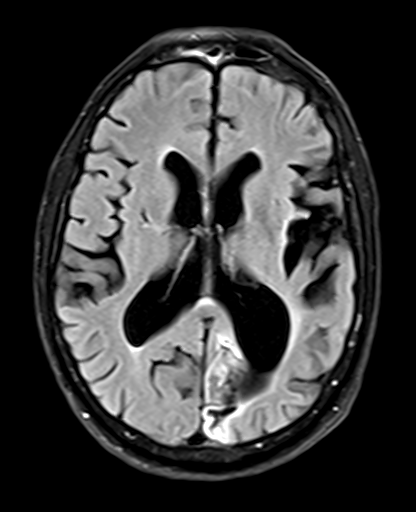
[im 25/25]
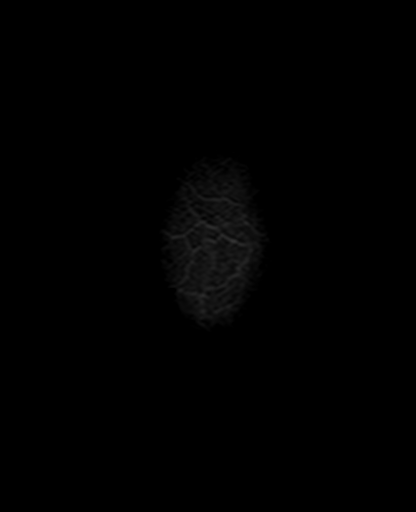

[Series 12: swi_images · axial · 3.0mm · 0.86mm/px · z∈[-24,+128]mm · 5 of 52 slices shown]
[im 1/52]
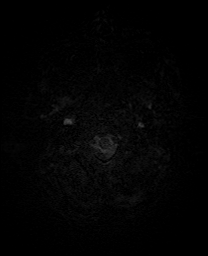
[im 13/52]
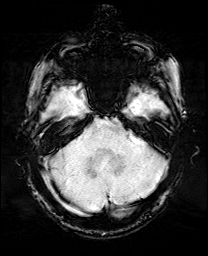
[im 26/52]
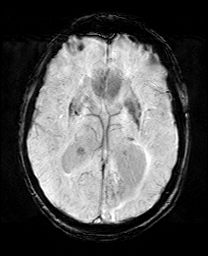
[im 39/52]
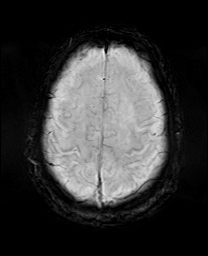
[im 52/52]
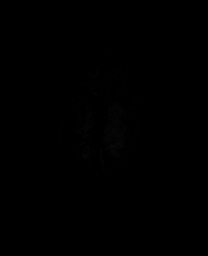

[Series 13: mip_images(sw) · axial · 24.0mm · 0.86mm/px · z∈[-14,+118]mm · 5 of 45 slices shown]
[im 1/45]
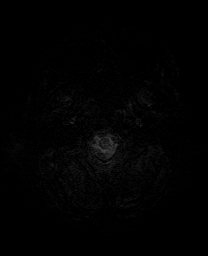
[im 12/45]
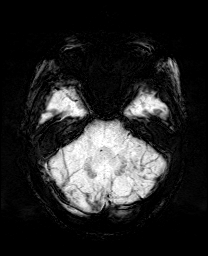
[im 23/45]
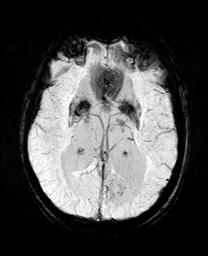
[im 34/45]
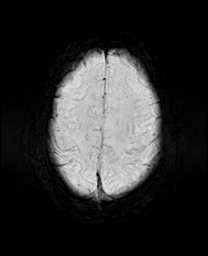
[im 45/45]
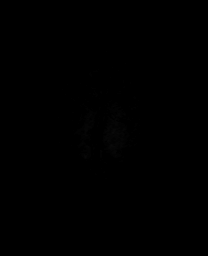

[Series 15: T2 · coronal · 5.0mm · 0.34mm/px · 3 of 29 slices shown (2 of 2)]
[im 1/29]
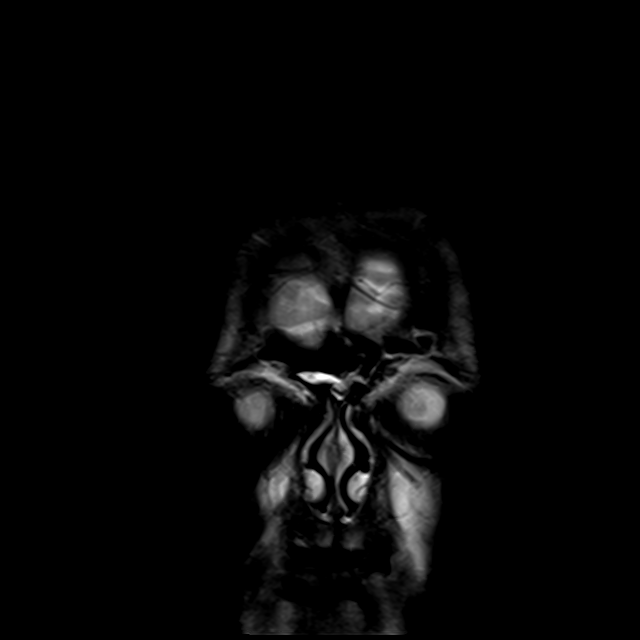
[im 15/29]
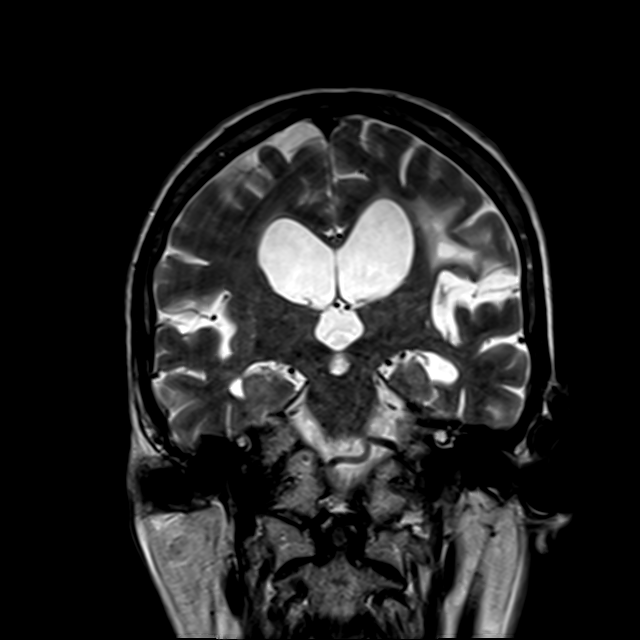
[im 29/29]
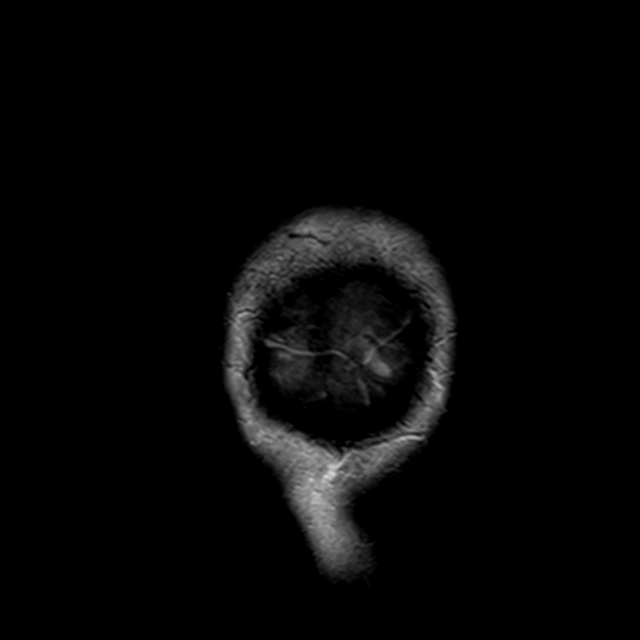

[43 of 48 positions shown; findings below may reference images not displayed]

FINDINGS: Brain: Encephalomalacia involving the medial and posterior left
occipital lobe demonstrates expected evolution from the prior exam.
Cortical laminar necrosis is present. There is ex vacuo dilation of
posterior horn of the left lateral ventricle.

Remote lacunar infarcts are again noted within the centrum semi
ovale bilaterally. Previous cortical infarcts are present in the
left frontal operculum.

No acute infarct, hemorrhage, or mass lesion is present.

There is some wallerian degeneration in the left cerebral peduncle.
Generalized atrophy is present. White matter disease is localized to
the areas of previous infarct. Brainstem is within normal limits.
Cerebellum is unremarkable.

Vascular: Flow is present in the major intracranial arteries.

Skull and upper cervical spine: The skull base is within normal
limits. The craniocervical junction is normal. Upper cervical spine
is unremarkable.

Sinuses/Orbits: Mild mucosal thickening is noted in the inferior
frontal sinuses bilaterally. There is mild scattered ethmoid
disease. Bilateral mastoid effusions are present. No obstructing
nasopharyngeal lesion is present.

Globes and orbits are within normal limits bilaterally.
IMPRESSION: 1. No acute intracranial abnormality.
2. Expected evolution of encephalomalacia involving the medial left
occipital lobe.
3. Stable chronic encephalomalacia involving the left frontal
operculum.
4. Stable chronic remote lacunar infarcts involving the centrum semi
ovale bilaterally.

## 2021-04-12 ENCOUNTER — Other Ambulatory Visit: Payer: Self-pay

## 2021-04-12 ENCOUNTER — Encounter (HOSPITAL_COMMUNITY): Payer: Self-pay | Admitting: *Deleted

## 2021-04-12 ENCOUNTER — Emergency Department (HOSPITAL_COMMUNITY): Payer: Medicare HMO

## 2021-04-12 ENCOUNTER — Observation Stay (HOSPITAL_COMMUNITY)
Admission: EM | Admit: 2021-04-12 | Discharge: 2021-04-15 | Disposition: A | Discharge status: Home / Self Care | Payer: Medicare HMO | Attending: Emergency Medicine | Admitting: Emergency Medicine

## 2021-04-12 DIAGNOSIS — N1832 Chronic kidney disease, stage 3b: Secondary | ICD-10-CM | POA: Diagnosis not present

## 2021-04-12 DIAGNOSIS — I509 Heart failure, unspecified: Secondary | ICD-10-CM | POA: Insufficient documentation

## 2021-04-12 DIAGNOSIS — Z20822 Contact with and (suspected) exposure to covid-19: Secondary | ICD-10-CM | POA: Insufficient documentation

## 2021-04-12 DIAGNOSIS — R0789 Other chest pain: Secondary | ICD-10-CM | POA: Diagnosis not present

## 2021-04-12 DIAGNOSIS — Z79899 Other long term (current) drug therapy: Secondary | ICD-10-CM | POA: Insufficient documentation

## 2021-04-12 DIAGNOSIS — R079 Chest pain, unspecified: Secondary | ICD-10-CM | POA: Diagnosis present

## 2021-04-12 DIAGNOSIS — E039 Hypothyroidism, unspecified: Secondary | ICD-10-CM | POA: Insufficient documentation

## 2021-04-12 DIAGNOSIS — Z87891 Personal history of nicotine dependence: Secondary | ICD-10-CM | POA: Insufficient documentation

## 2021-04-12 DIAGNOSIS — Z8673 Personal history of transient ischemic attack (TIA), and cerebral infarction without residual deficits: Secondary | ICD-10-CM | POA: Insufficient documentation

## 2021-04-12 DIAGNOSIS — Z7982 Long term (current) use of aspirin: Secondary | ICD-10-CM | POA: Diagnosis not present

## 2021-04-12 DIAGNOSIS — I2510 Atherosclerotic heart disease of native coronary artery without angina pectoris: Secondary | ICD-10-CM | POA: Diagnosis not present

## 2021-04-12 DIAGNOSIS — E785 Hyperlipidemia, unspecified: Secondary | ICD-10-CM

## 2021-04-12 LAB — TROPONIN I (HIGH SENSITIVITY): Troponin I (High Sensitivity): 20 ng/L — ABNORMAL HIGH (ref <18)

## 2021-04-12 LAB — BASIC METABOLIC PANEL
Anion gap: 11 (ref 5–15)
BUN: 28 mg/dL — ABNORMAL HIGH (ref 8–23)
CO2: 25 mmol/L (ref 22–32)
Calcium: 9.3 mg/dL (ref 8.9–10.3)
Chloride: 106 mmol/L (ref 98–111)
Creatinine, Ser: 1.45 mg/dL — ABNORMAL HIGH (ref 0.61–1.24)
GFR, Estimated: 53 mL/min — ABNORMAL LOW (ref >60)
Glucose, Bld: 79 mg/dL (ref 70–99)
Potassium: 4.6 mmol/L (ref 3.5–5.1)
Sodium: 142 mmol/L (ref 135–145)

## 2021-04-12 LAB — CBC
HCT: 37.9 % — ABNORMAL LOW (ref 39.0–52.0)
Hemoglobin: 12.1 g/dL — ABNORMAL LOW (ref 13.0–17.0)
MCH: 28.7 pg (ref 26.0–34.0)
MCHC: 31.9 g/dL (ref 30.0–36.0)
MCV: 89.8 fL (ref 80.0–100.0)
Platelets: 246 K/uL (ref 150–400)
RBC: 4.22 MIL/uL (ref 4.22–5.81)
RDW: 13.6 % (ref 11.5–15.5)
WBC: 7.2 K/uL (ref 4.0–10.5)
nRBC: 0 % (ref 0.0–0.2)

## 2021-04-12 NOTE — ED Provider Notes (Signed)
Emergency Medicine Provider Triage Evaluation Note  Donald [name] , a 65 y.o. male  was evaluated in triage.  Pt complains of cp.  Review of Systems  Positive: Cp, lighthead Negative: Fever, cough, sob, diaphoresis, n/v  Physical Exam  BP (!) 147/66 (BP Location: Left Arm)   Pulse 78   Temp 97.8 F (36.6 C) (Oral)   Resp 18   Ht 5\' 3"  (1.6 m)   Wt 67.1 kg   SpO2 100%   BMI 26.20 kg/m  Gen:   Awake, no distress   Resp:  Normal effort  MSK:   Moves extremities without difficulty  Other:    Medical Decision Making  Medically screening exam initiated at 9:53 PM.  Appropriate orders placed.  Donald [name] was informed that the remainder of the evaluation will be completed by another provider, this initial triage assessment does not replace that evaluation, and the importance of remaining in the ED until their evaluation is complete.  PT had open heart surgery 2 yrs ago.  Has had recurrent cp since, happened sporadically and doesn't feel like his heart attack.  However his nephew wants him to get checked out. No active cp currently.   Domenic Moras, PA-C 04/12/21 2155    Hayden Rasmussen, MD 04/12/21 2308

## 2021-04-12 NOTE — ED Triage Notes (Signed)
The pt arrived by [facility] from home  pt c/o rt sided chest pain  today  he reports  he had pain for 30 minutes none now

## 2021-04-13 DIAGNOSIS — I25110 Atherosclerotic heart disease of native coronary artery with unstable angina pectoris: Secondary | ICD-10-CM

## 2021-04-13 DIAGNOSIS — I480 Paroxysmal atrial fibrillation: Secondary | ICD-10-CM

## 2021-04-13 DIAGNOSIS — I5020 Unspecified systolic (congestive) heart failure: Secondary | ICD-10-CM

## 2021-04-13 DIAGNOSIS — R079 Chest pain, unspecified: Secondary | ICD-10-CM | POA: Diagnosis present

## 2021-04-13 LAB — RESP PANEL BY RT-PCR (FLU A&B, COVID) ARPGX2
Influenza A by PCR: NEGATIVE
Influenza B by PCR: NEGATIVE
SARS Coronavirus 2 by RT PCR: NEGATIVE

## 2021-04-13 LAB — TROPONIN I (HIGH SENSITIVITY)
Troponin I (High Sensitivity): 25 ng/L — ABNORMAL HIGH (ref <18)
Troponin I (High Sensitivity): 38 ng/L — ABNORMAL HIGH (ref <18)

## 2021-04-13 LAB — CBC
HCT: 37.1 % — ABNORMAL LOW (ref 39.0–52.0)
Hemoglobin: 12.4 g/dL — ABNORMAL LOW (ref 13.0–17.0)
MCH: 28.8 pg (ref 26.0–34.0)
MCHC: 33.4 g/dL (ref 30.0–36.0)
MCV: 86.3 fL (ref 80.0–100.0)
Platelets: 268 10*3/uL (ref 150–400)
RBC: 4.3 MIL/uL (ref 4.22–5.81)
RDW: 13.3 % (ref 11.5–15.5)
WBC: 7 10*3/uL (ref 4.0–10.5)
nRBC: 0 % (ref 0.0–0.2)

## 2021-04-13 LAB — HIV ANTIBODY (ROUTINE TESTING W REFLEX): HIV Screen 4th Generation wRfx: NONREACTIVE

## 2021-04-13 MED ORDER — NITROGLYCERIN 0.4 MG SL SUBL
0.4000 mg | SUBLINGUAL_TABLET | Freq: Once | SUBLINGUAL | Status: AC
  Administered 2021-04-13: 0.4 mg via SUBLINGUAL
  Filled 2021-04-13: qty 1

## 2021-04-13 MED ORDER — ATORVASTATIN CALCIUM 40 MG PO TABS
40.0000 mg | ORAL_TABLET | Freq: Every day | ORAL | Status: DC
  Administered 2021-04-13 – 2021-04-15 (×3): 40 mg via ORAL
  Filled 2021-04-13 (×3): qty 1

## 2021-04-13 MED ORDER — SERTRALINE HCL 50 MG PO TABS
25.0000 mg | ORAL_TABLET | Freq: Every day | ORAL | Status: DC
  Administered 2021-04-13 – 2021-04-15 (×3): 25 mg via ORAL
  Filled 2021-04-13 (×3): qty 1

## 2021-04-13 MED ORDER — ISOSORBIDE MONONITRATE ER 30 MG PO TB24
15.0000 mg | ORAL_TABLET | Freq: Every day | ORAL | Status: DC
  Administered 2021-04-13 – 2021-04-14 (×2): 15 mg via ORAL
  Filled 2021-04-13 (×2): qty 1

## 2021-04-13 MED ORDER — PANTOPRAZOLE SODIUM 40 MG PO TBEC
40.0000 mg | DELAYED_RELEASE_TABLET | Freq: Every day | ORAL | Status: DC
  Administered 2021-04-13 – 2021-04-15 (×3): 40 mg via ORAL
  Filled 2021-04-13 (×3): qty 1

## 2021-04-13 MED ORDER — TAMSULOSIN HCL 0.4 MG PO CAPS
0.4000 mg | ORAL_CAPSULE | Freq: Every day | ORAL | Status: DC
  Administered 2021-04-13 – 2021-04-15 (×3): 0.4 mg via ORAL
  Filled 2021-04-13 (×3): qty 1

## 2021-04-13 MED ORDER — LISINOPRIL 5 MG PO TABS
5.0000 mg | ORAL_TABLET | Freq: Every day | ORAL | Status: DC
  Administered 2021-04-13 – 2021-04-15 (×3): 5 mg via ORAL
  Filled 2021-04-13 (×3): qty 1

## 2021-04-13 MED ORDER — ASPIRIN EC 81 MG PO TBEC
81.0000 mg | DELAYED_RELEASE_TABLET | Freq: Every day | ORAL | Status: DC
  Administered 2021-04-13 – 2021-04-15 (×3): 81 mg via ORAL
  Filled 2021-04-13 (×3): qty 1

## 2021-04-13 MED ORDER — CARVEDILOL 6.25 MG PO TABS
6.2500 mg | ORAL_TABLET | Freq: Two times a day (BID) | ORAL | Status: DC
  Administered 2021-04-13 – 2021-04-15 (×6): 6.25 mg via ORAL
  Filled 2021-04-13 (×4): qty 1
  Filled 2021-04-13: qty 2
  Filled 2021-04-13: qty 1

## 2021-04-13 MED ORDER — TRAZODONE HCL 50 MG PO TABS: 50.0000 mg | ORAL_TABLET | Freq: Every evening | ORAL | Status: DC | PRN

## 2021-04-13 MED ORDER — GABAPENTIN 100 MG PO CAPS
100.0000 mg | ORAL_CAPSULE | Freq: Two times a day (BID) | ORAL | Status: DC
  Administered 2021-04-13 – 2021-04-15 (×5): 100 mg via ORAL
  Filled 2021-04-13 (×5): qty 1

## 2021-04-13 MED ORDER — NITROGLYCERIN 0.4 MG SL SUBL: 0.4000 mg | SUBLINGUAL_TABLET | SUBLINGUAL | Status: DC | PRN

## 2021-04-13 MED ORDER — ENOXAPARIN SODIUM 40 MG/0.4ML IJ SOSY
40.0000 mg | PREFILLED_SYRINGE | INTRAMUSCULAR | Status: DC
Start: 2021-04-13 — End: 2021-04-16
  Administered 2021-04-13 – 2021-04-14 (×2): 40 mg via SUBCUTANEOUS
  Filled 2021-04-13 (×2): qty 0.4

## 2021-04-13 MED ORDER — ACETAMINOPHEN 325 MG PO TABS: 650.0000 mg | ORAL_TABLET | ORAL | Status: DC | PRN

## 2021-04-13 MED ORDER — ONDANSETRON HCL 4 MG/2ML IJ SOLN: 4.0000 mg | Freq: Four times a day (QID) | INTRAMUSCULAR | Status: DC | PRN
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
## 2021-04-14 DIAGNOSIS — I200 Unstable angina: Secondary | ICD-10-CM | POA: Diagnosis not present

## 2021-04-14 LAB — LIPID PANEL
Cholesterol: 126 mg/dL (ref 0–200)
HDL: 36 mg/dL — ABNORMAL LOW (ref >40)
LDL Cholesterol: 70 mg/dL (ref 0–99)
Total CHOL/HDL Ratio: 3.5 RATIO
Triglycerides: 98 mg/dL (ref <150)
VLDL: 20 mg/dL (ref 0–40)

## 2021-04-14 LAB — BASIC METABOLIC PANEL
Anion gap: 11 (ref 5–15)
BUN: 27 mg/dL — ABNORMAL HIGH (ref 8–23)
CO2: 21 mmol/L — ABNORMAL LOW (ref 22–32)
Calcium: 9 mg/dL (ref 8.9–10.3)
Chloride: 105 mmol/L (ref 98–111)
Creatinine, Ser: 1.54 mg/dL — ABNORMAL HIGH (ref 0.61–1.24)
GFR, Estimated: 49 mL/min — ABNORMAL LOW (ref >60)
Glucose, Bld: 95 mg/dL (ref 70–99)
Potassium: 4.3 mmol/L (ref 3.5–5.1)
Sodium: 137 mmol/L (ref 135–145)

## 2021-04-14 LAB — CBC
HCT: 37.1 % — ABNORMAL LOW (ref 39.0–52.0)
Hemoglobin: 11.9 g/dL — ABNORMAL LOW (ref 13.0–17.0)
MCH: 28.5 pg (ref 26.0–34.0)
MCHC: 32.1 g/dL (ref 30.0–36.0)
MCV: 89 fL (ref 80.0–100.0)
Platelets: 236 10*3/uL (ref 150–400)
RBC: 4.17 MIL/uL — ABNORMAL LOW (ref 4.22–5.81)
RDW: 13.6 % (ref 11.5–15.5)
WBC: 7 10*3/uL (ref 4.0–10.5)
nRBC: 0 % (ref 0.0–0.2)

## 2021-04-14 MED ORDER — ISOSORBIDE MONONITRATE ER 30 MG PO TB24
15.0000 mg | ORAL_TABLET | Freq: Once | ORAL | Status: AC
  Administered 2021-04-14: 15 mg via ORAL
  Filled 2021-04-14: qty 1

## 2021-04-14 MED ORDER — ISOSORBIDE MONONITRATE ER 30 MG PO TB24
30.0000 mg | ORAL_TABLET | Freq: Every day | ORAL | Status: DC
  Administered 2021-04-15: 30 mg via ORAL
  Filled 2021-04-14: qty 1

## 2021-04-14 NOTE — Progress Notes (Signed)
Progress Note  Patient Name: Donald [name] Date of Encounter: 04/14/2021  Primary Cardiologist: Jenkins Rouge, MD   Subjective   Overnight no change in his chest pain.  Still present but 2/10.  Echo has not been performed yet.  Inpatient Medications    Scheduled Meds:  aspirin EC  81 mg Oral Daily   atorvastatin  40 mg Oral q1800   carvedilol  6.25 mg Oral BID WC   enoxaparin (LOVENOX) injection  40 mg Subcutaneous Q24H   gabapentin  100 mg Oral BID   isosorbide mononitrate  15 mg Oral Daily   lisinopril  5 mg Oral Daily   pantoprazole  40 mg Oral Daily   sertraline  25 mg Oral Daily   tamsulosin  0.4 mg Oral Daily   Continuous Infusions:  PRN Meds: acetaminophen, nitroGLYCERIN, ondansetron (ZOFRAN) IV, traZODone   Vital Signs    Vitals:   04/13/21 1604 04/13/21 1704 04/13/21 2200 04/14/21 0500  BP: 105/65 120/64 122/66   Pulse:  73    Resp:  18 18   Temp:  97.9 F (36.6 C) 97.8 F (36.6 C)   TempSrc:  Oral Oral   SpO2:  99%    Weight:  60.1 kg  58 kg  Height:  5\' 3"  (1.6 m)      Intake/Output Summary (Last 24 hours) at 04/14/2021 1120 Last data filed at 04/14/2021 0900 Gross per 24 hour  Intake 240 ml  Output 800 ml  Net -560 ml   Filed Weights   04/12/21 2146 04/13/21 1704 04/14/21 0500  Weight: 67.1 kg 60.1 kg 58 kg    Telemetry    SR with PVCs - Personally Reviewed  ECG    No new - Personally Reviewed  Physical Exam   GEN: No acute distress.   Neck: No JVD Cardiac: RRR, no murmurs, rubs, or gallops.  Respiratory: Clear to auscultation bilaterally. GI: Soft, nontender, non-distended  MS: No edema; No deformity. Neuro:  Nonfocal  Psych: Normal affect   Labs    Chemistry Recent Labs  Lab 04/12/21 2213 04/14/21 0230  NA 142 137  K 4.6 4.3  CL 106 105  CO2 25 21*  GLUCOSE 79 95  BUN 28* 27*  CREATININE 1.45* 1.54*  CALCIUM 9.3 9.0  GFRNONAA 53* 49*  ANIONGAP 11 11     Hematology Recent Labs  Lab 04/12/21 2213  04/13/21 1724 04/14/21 0230  WBC 7.2 7.0 7.0  RBC 4.22 4.30 4.17*  HGB 12.1* 12.4* 11.9*  HCT 37.9* 37.1* 37.1*  MCV 89.8 86.3 89.0  MCH 28.7 28.8 28.5  MCHC 31.9 33.4 32.1  RDW 13.6 13.3 13.6  PLT 246 268 236    Cardiac EnzymesNo results for input(s): TROPONINI in the last 168 hours. No results for input(s): TROPIPOC in the last 168 hours.   BNPNo results for input(s): BNP, PROBNP in the last 168 hours.   DDimer No results for input(s): DDIMER in the last 168 hours.   Radiology    DG Chest Port 1 View  Result Date: 04/12/2021 CLINICAL DATA:  Chest pain EXAM: PORTABLE CHEST 1 VIEW COMPARISON:  None. FINDINGS: Wireless cardiac device overlies left chest. The heart and mediastinal contours are unchanged. Surgical changes overlie the mediastinum. No focal consolidation. No pulmonary edema. No pleural effusion. No pneumothorax. No acute osseous abnormality. IMPRESSION: No active disease. Electronically Signed   By: Iven Finn M.D.   On: 04/12/2021 22:20    Cardiac Studies   Transthoracic Echocardiogram:  Date:06/11/2017 Results: reportedly no change at [facility] imaging Left ventricle: LVEF Is approximately 40 to 45% with akinesis of    the apical portion of LV. with mild aneurysymal dilitation. The    cavity size was normal. Wall thickness was normal. Doppler    parameters are consistent with abnormal left ventricular    relaxation (grade 1 diastolic dysfunction).     Patient Profile     65 y.o. male with CAD presenting with new CP  Assessment & Plan      CAD, HF 40-45% with apical aneurysm and no LV thrombus ischemic cardiomyopathy, HLD, history of CVA, CKD Stage II, brain aneurysm NOS, PAF s/p ILR     CAD s/p CABG - (rSVG-distal RCA, rSVG-OM2, LIMA-LAD HFmrEF with apical anerysm and PAF CHADSVASC 5 - on eliquis no further PAF on ILR Prior Stroke Hx of CVA PVCs - continue home meds ASA, low dose ACEi, atorvastatin 40 mg, and coreg to 6.25 mg PO BID - increase  total  Imdur dose to 30 mg PO daily i - will get echo to check for new WMAs (pending ) - potential DC 04/15/21   Continug home trazodone, PPI, Tamsulosin   Lovenox PPX Full Code Cardiac Diet Labs ordered for 04/15/21  For questions or updates, please contact [facility] Please consult [email or web address] for contact info under Cardiology/STEMI.      Signed, Werner Lean, MD  04/14/2021, 11:20 AM

## 2021-04-14 NOTE — Progress Notes (Signed)
Central telemetry called to inform this RN pt having 4 beat run of vtach. Pt asymptomatic. Vss. MD notfied. No new orders given.

## 2021-04-15 ENCOUNTER — Observation Stay (HOSPITAL_BASED_OUTPATIENT_CLINIC_OR_DEPARTMENT_OTHER): Payer: Medicare HMO

## 2021-04-15 DIAGNOSIS — E7800 Pure hypercholesterolemia, unspecified: Secondary | ICD-10-CM | POA: Diagnosis not present

## 2021-04-15 DIAGNOSIS — R0789 Other chest pain: Secondary | ICD-10-CM | POA: Diagnosis not present

## 2021-04-15 DIAGNOSIS — R079 Chest pain, unspecified: Secondary | ICD-10-CM | POA: Diagnosis not present

## 2021-04-15 DIAGNOSIS — Z20822 Contact with and (suspected) exposure to covid-19: Secondary | ICD-10-CM | POA: Diagnosis not present

## 2021-04-15 DIAGNOSIS — E039 Hypothyroidism, unspecified: Secondary | ICD-10-CM | POA: Diagnosis not present

## 2021-04-15 DIAGNOSIS — I2510 Atherosclerotic heart disease of native coronary artery without angina pectoris: Secondary | ICD-10-CM

## 2021-04-15 DIAGNOSIS — E785 Hyperlipidemia, unspecified: Secondary | ICD-10-CM

## 2021-04-15 LAB — BASIC METABOLIC PANEL
Anion gap: 8 (ref 5–15)
BUN: 35 mg/dL — ABNORMAL HIGH (ref 8–23)
CO2: 22 mmol/L (ref 22–32)
Calcium: 8.5 mg/dL — ABNORMAL LOW (ref 8.9–10.3)
Chloride: 106 mmol/L (ref 98–111)
Creatinine, Ser: 1.87 mg/dL — ABNORMAL HIGH (ref 0.61–1.24)
GFR, Estimated: 39 mL/min — ABNORMAL LOW (ref >60)
Glucose, Bld: 141 mg/dL — ABNORMAL HIGH (ref 70–99)
Potassium: 4.3 mmol/L (ref 3.5–5.1)
Sodium: 136 mmol/L (ref 135–145)

## 2021-04-15 LAB — CBC
HCT: 35.1 % — ABNORMAL LOW (ref 39.0–52.0)
Hemoglobin: 11.6 g/dL — ABNORMAL LOW (ref 13.0–17.0)
MCH: 28.9 pg (ref 26.0–34.0)
MCHC: 33 g/dL (ref 30.0–36.0)
MCV: 87.3 fL (ref 80.0–100.0)
Platelets: 200 10*3/uL (ref 150–400)
RBC: 4.02 MIL/uL — ABNORMAL LOW (ref 4.22–5.81)
RDW: 13.4 % (ref 11.5–15.5)
WBC: 7.4 10*3/uL (ref 4.0–10.5)
nRBC: 0 % (ref 0.0–0.2)

## 2021-04-15 LAB — ECHOCARDIOGRAM COMPLETE
AR max vel: 3.78 cm2
AV Area VTI: 3.7 cm2
AV Area mean vel: 3.62 cm2
AV Mean grad: 3 mmHg
AV Peak grad: 4.8 mmHg
Ao pk vel: 1.1 m/s
Area-P 1/2: 2.22 cm2
Calc EF: 42.6 %
Height: 63 in
MV VTI: 2.5 cm2
S' Lateral: 4.2 cm
Single Plane A2C EF: 36.9 %
Single Plane A4C EF: 47.6 %
Weight: 2056.45 oz

## 2021-04-15 MED ORDER — PERFLUTREN LIPID MICROSPHERE
1.0000 mL | INTRAVENOUS | Status: AC | PRN
  Administered 2021-04-15: 5 mL via INTRAVENOUS
  Filled 2021-04-15: qty 10

## 2021-04-15 MED ORDER — ISOSORBIDE MONONITRATE ER 30 MG PO TB24: 30.0000 mg | ORAL_TABLET | Freq: Every day | ORAL | 1 refills | Status: DC

## 2021-04-15 MED ORDER — CARVEDILOL 6.25 MG PO TABS: 6.2500 mg | ORAL_TABLET | Freq: Two times a day (BID) | ORAL | 1 refills | Status: DC

## 2021-04-15 NOTE — Progress Notes (Signed)
Progress Note  Patient Name: Donald [name] Date of Encounter: 04/15/2021  [facility] Cardiologist: Jenkins Rouge, MD   Subjective   No acute events overnight. Has near constant mild chest tightness/discomfort, but he is able to walk 2 miles daily without limitations. Symptoms not worse with exertion. He can localize the pain, and it is worse with pressing on the sternum. He has ambulated to the bathroom today without issues. We reviewed his medications and recommended changes today. He would like to go home.  Inpatient Medications    Scheduled Meds:  aspirin EC  81 mg Oral Daily   atorvastatin  40 mg Oral q1800   carvedilol  6.25 mg Oral BID WC   enoxaparin (LOVENOX) injection  40 mg Subcutaneous Q24H   gabapentin  100 mg Oral BID   isosorbide mononitrate  30 mg Oral Daily   lisinopril  5 mg Oral Daily   pantoprazole  40 mg Oral Daily   sertraline  25 mg Oral Daily   tamsulosin  0.4 mg Oral Daily   Continuous Infusions:  PRN Meds: acetaminophen, nitroGLYCERIN, ondansetron (ZOFRAN) IV, perflutren lipid microspheres (DEFINITY) IV suspension, traZODone   Vital Signs    Vitals:   04/14/21 1417 04/14/21 2009 04/15/21 0424 04/15/21 0900  BP: (!) 93/54 115/66 109/65 (!) 125/59  Pulse: 62 71 66 66  Resp:  18 18 18   Temp: 97.7 F (36.5 C) 97.9 F (36.6 C) 97.8 F (36.6 C) 98.1 F (36.7 C)  TempSrc: Oral Oral Oral Oral  SpO2: 100% 99% 100% 95%  Weight:   58.3 kg   Height:        Intake/Output Summary (Last 24 hours) at 04/15/2021 1230 Last data filed at 04/15/2021 0900 Gross per 24 hour  Intake 240 ml  Output 950 ml  Net -710 ml   Last 3 Weights 04/15/2021 04/14/2021 04/13/2021  Weight (lbs) 128 lb 8.5 oz 127 lb 13.9 oz 132 lb 8 oz  Weight (kg) 58.3 kg 58 kg 60.102 kg      Telemetry    NSR with occasional PVCs - Personally Reviewed  ECG    04/15/21 NSR at 70 bpm - Personally Reviewed  Physical Exam   GEN: No acute distress.   Neck: No  JVD Cardiac: RRR, no murmurs, rubs, or gallops. Tender to palpation across sternum, with palpable chest wire felt (but not sharp) Respiratory: Clear to auscultation bilaterally. GI: Soft, nontender, non-distended  MS: No edema; No deformity. Neuro:  Nonfocal  Psych: Normal affect   Labs    High Sensitivity Troponin:   Recent Labs  Lab 04/12/21 2213 04/13/21 0039 04/13/21 0843  TROPONINIHS 20* 25* 38*     Chemistry Recent Labs  Lab 04/12/21 2213 04/14/21 0230 04/15/21 0149  NA 142 137 136  K 4.6 4.3 4.3  CL 106 105 106  CO2 25 21* 22  GLUCOSE 79 95 141*  BUN 28* 27* 35*  CREATININE 1.45* 1.54* 1.87*  CALCIUM 9.3 9.0 8.5*  GFRNONAA 53* 49* 39*  ANIONGAP 11 11 8     Lipids  Recent Labs  Lab 04/14/21 0230  CHOL 126  TRIG 98  HDL 36*  LDLCALC 70  CHOLHDL 3.5    Hematology Recent Labs  Lab 04/13/21 1724 04/14/21 0230 04/15/21 0149  WBC 7.0 7.0 7.4  RBC 4.30 4.17* 4.02*  HGB 12.4* 11.9* 11.6*  HCT 37.1* 37.1* 35.1*  MCV 86.3 89.0 87.3  MCH 28.8 28.5 28.9  MCHC 33.4 32.1 33.0  RDW 13.3  13.6 13.4  PLT 268 236 200   Thyroid No results for input(s): TSH, FREET4 in the last 168 hours.  BNPNo results for input(s): BNP, PROBNP in the last 168 hours.  DDimer No results for input(s): DDIMER in the last 168 hours.   Radiology    No results found.  Cardiac Studies   Echo formal read pending  Patient Profile     65 y.o. male with PMH CAD, presenting with chest pain  Assessment & Plan    CAD s/p prior CABG (SVG-dRCA, SVG-OM2, LIMA-LAD) Chest pain Hypercholesterolemia -continue aspirin, atorvastatin -hsTnI 20 > 25 > 38 -LDL 70, just at goal -continue carvedilol, dose increased this admission -started on low dose imdur, monitor blood pressure closely -current pain is atypical and tender on exam, but he has known disease and is at risk for angina/MI. Reviewed medications to optimize his management -we discussed red flag warning signs that need immediate  medical attention  Ischemic cardiomyopathy Apical aneurysm -hypotension limits medical management of cardiomyopathy -continue lisinopril 2.5 mg daily -continue carvedilol, dose increased this admission  History of CVA, history of brain aneurysm -no acute symptoms  CKD stage 3b -Cr up to 1.87 today, prior to admission most recent Cr was 1.8, with GFR 39, consistent with CKD stage 3b  Paroxysmal atrial fibrillation s/p ILR -none seen this admission  If echo without significant changes, anticipate potential discharge later today.  For questions or updates, please contact [facility] Please consult [email or web address] for contact info under        Signed, Buford Dresser, MD  04/15/2021, 12:30 PM

## 2021-04-15 NOTE — Discharge Summary (Signed)
Discharge Summary    Patient ID: Donald [name] MRN: [id number]; DOB: 03/08/1956  Admit date: 04/12/2021 Discharge date: 04/15/2021  PCP:  Zola Button, MD   [facility] Providers Cardiologist:  Jenkins Rouge, MD   Discharge Diagnoses    Principal Problem:   Chest pain Active Problems:   Coronary artery disease   Hyperlipidemia  Diagnostic Studies/Procedures    Echo: 04/15/21  IMPRESSIONS    1. Left ventricular ejection fraction, by estimation, is 40 to 45%. The  left ventricle has mildly decreased function. The left ventricle  demonstrates regional wall motion abnormalities (see scoring  diagram/findings for description). Left ventricular  diastolic parameters are consistent with Grade I diastolic dysfunction  (impaired relaxation).   2. Right ventricular systolic function is normal. The right ventricular  size is normal.   3. Left atrial size was moderately dilated.   4. The mitral valve is normal in structure. No evidence of mitral valve  regurgitation. No evidence of mitral stenosis.   5. The aortic valve is normal in structure. Aortic valve regurgitation is  not visualized. No aortic stenosis is present.   6. The inferior vena cava is normal in size with greater than 50%  respiratory variability, suggesting right atrial pressure of 3 mmHg.   FINDINGS   Left Ventricle: Left ventricular ejection fraction, by estimation, is 40  to 45%. The left ventricle has mildly decreased function. The left  ventricle demonstrates regional wall motion abnormalities. The left  ventricular internal cavity size was normal  in size. There is no left ventricular hypertrophy. Left ventricular  diastolic parameters are consistent with Grade I diastolic dysfunction  (impaired relaxation).      LV Wall Scoring:  The apical lateral segment, apical septal segment, and apex are akinetic.   Right Ventricle: The right ventricular size is normal. No increase in  right ventricular  wall thickness. Right ventricular systolic function is  normal.   Left Atrium: Left atrial size was moderately dilated.   Right Atrium: Right atrial size was normal in size.   Pericardium: There is no evidence of pericardial effusion.   Mitral Valve: The mitral valve is normal in structure. No evidence of  mitral valve regurgitation. No evidence of mitral valve stenosis. MV peak  gradient, 6.2 mmHg. The mean mitral valve gradient is 3.0 mmHg.   Tricuspid Valve: The tricuspid valve is normal in structure. Tricuspid  valve regurgitation is not demonstrated. No evidence of tricuspid  stenosis.   Aortic Valve: The aortic valve is normal in structure. Aortic valve  regurgitation is not visualized. No aortic stenosis is present. Aortic  valve mean gradient measures 3.0 mmHg. Aortic valve peak gradient measures  4.8 mmHg. Aortic valve area, by VTI  measures 3.70 cm.   Pulmonic Valve: The pulmonic valve was normal in structure. Pulmonic valve  regurgitation is not visualized. No evidence of pulmonic stenosis.   Aorta: The aortic root is normal in size and structure.   Venous: The inferior vena cava is normal in size with greater than 50%  respiratory variability, suggesting right atrial pressure of 3 mmHg.   IAS/Shunts: No atrial level shunt detected by color flow Doppler.  _____________   History of Present Illness     Donald [name] is a 65 y.o. male with CAD, HF 40-45% with apical aneurysm and no LV thrombus ischemic cardiomyopathy, HLD, history of CVA, CKD Stage II, brain aneurysm NOS, PAF s/p ILR who presented to the ED with chest pain.   He was  sitting up on the cough watching TV on 04/11/21 and had sudden onset chest pain:  sternal chest pain that radiated to his right shoulder.  This was simillar to his prior angina.  Noted it sporadically into 04/12/21 which lead to ED evaluation.  Since PM of ED evl, troponin has been < 100.  Patient was initially planned for discharge on  low dose Imdur when had new chest pain again AM of 04/13/21.  Cardiology called for evaluation and admission.   Discomfort occurred without provocation, worsens with no clear triggers, and improves with time and nitroglycerin.  Patient exertion notable for being able to do ADLs and do things with his girlfriend  and feels no symptoms.  No shortness of breath, DOE .  No PND or orthopnea.  No bendopnea, weight gain, leg swelling , or abdominal swelling.  No syncope or near syncope. Notes no palpitations or funny heart beats- notes that when he goes into atrial fibrillation he gets confused; this may also be because he has "fluid on the brain" per family.     Hospital Course     CAD s/p prior CABG (SVG-dRCA, SVG-OM2, LIMA-LAD) Chest pain Hypercholesterolemia -continue aspirin, atorvastatin -hsTnI 20 > 25 > 38 -LDL 70, just at goal -continue carvedilol, dose increased this admission -started on low dose imdur with bp tolerating -current pain was atypical and tender on exam, but he has known disease and was at risk for angina/MI. Reviewed medications to optimize his management -Dr. Harrell Gave discussed red flag warning signs that need immediate medical attention   Ischemic cardiomyopathy Apical aneurysm -echo with LVEF unchanged from prior 06/2017 with EF of 40-45% apical lateral segment, apical septal segment, and apex being akinetic -hypotension limits medical management of cardiomyopathy -continue lisinopril 2.5 mg daily -continue carvedilol, dose increased this admission to 6.25mg  BID   History of CVA, history of brain aneurysm -no acute symptoms   CKD stage 3b -Cr up to 1.87 the day of discharge, prior to admission most recent Cr was 1.8, with GFR 39, consistent with CKD stage 3b   Paroxysmal atrial fibrillation s/p ILR -none seen this admission  Patient was seen by Dr. Harrell Gave and deemed stable for discharge home. Follow up in the office has been arranged. Medications sent to  pharmacy of choice.   Did the patient have an acute coronary syndrome (MI, NSTEMI, STEMI, etc) this admission?:  No.   The elevated Troponin was due to the acute medical illness (demand ischemia).   _____________  Discharge Vitals Blood pressure (!) 125/59, pulse 66, temperature 98.1 F (36.7 C), temperature source Oral, resp. rate 18, height 5\' 3"  (1.6 m), weight 58.3 kg, SpO2 95 %.  Filed Weights   04/13/21 1704 04/14/21 0500 04/15/21 0424  Weight: 60.1 kg 58 kg 58.3 kg    Labs & Radiologic Studies    CBC Recent Labs    04/14/21 0230 04/15/21 0149  WBC 7.0 7.4  HGB 11.9* 11.6*  HCT 37.1* 35.1*  MCV 89.0 87.3  PLT 236 401   Basic Metabolic Panel Recent Labs    04/14/21 0230 04/15/21 0149  NA 137 136  K 4.3 4.3  CL 105 106  CO2 21* 22  GLUCOSE 95 141*  BUN 27* 35*  CREATININE 1.54* 1.87*  CALCIUM 9.0 8.5*   Liver Function Tests No results for input(s): AST, ALT, ALKPHOS, BILITOT, PROT, ALBUMIN in the last 72 hours. No results for input(s): LIPASE, AMYLASE in the last 72 hours. High Sensitivity Troponin:  Recent Labs  Lab 04/12/21 2213 04/13/21 0039 04/13/21 0843  TROPONINIHS 20* 25* 38*    BNP Invalid input(s): POCBNP D-Dimer No results for input(s): DDIMER in the last 72 hours. Hemoglobin A1C No results for input(s): HGBA1C in the last 72 hours. Fasting Lipid Panel Recent Labs    04/14/21 0230  CHOL 126  HDL 36*  LDLCALC 70  TRIG 98  CHOLHDL 3.5   Thyroid Function Tests No results for input(s): TSH, T4TOTAL, T3FREE, THYROIDAB in the last 72 hours.  Invalid input(s): FREET3 _____________  DG Chest Port 1 View  Result Date: 04/12/2021 CLINICAL DATA:  Chest pain EXAM: PORTABLE CHEST 1 VIEW COMPARISON:  None. FINDINGS: Wireless cardiac device overlies left chest. The heart and mediastinal contours are unchanged. Surgical changes overlie the mediastinum. No focal consolidation. No pulmonary edema. No pleural effusion. No pneumothorax. No acute  osseous abnormality. IMPRESSION: No active disease. Electronically Signed   By: Iven Finn M.D.   On: 04/12/2021 22:20   ECHOCARDIOGRAM COMPLETE  Result Date: 04/15/2021    ECHOCARDIOGRAM REPORT   Patient Name:   Donald [name] Date of Exam: 04/15/2021 Medical Rec #:  [id number]         Height:       63.0 in Accession #:    [id number]        Weight:       128.5 lb Date of Birth:  August 20, 1955         BSA:          1.602 m Patient Age:    66 years          BP:           109/65 mmHg Patient Gender: M                 HR:           70 bpm. Exam Location:  Inpatient Procedure: 2D Echo, Cardiac Doppler and Color Doppler Indications:     Chest Pain  History:         Patient has no prior history of Echocardiogram examinations and                  Patient has prior history of Echocardiogram examinations, most                  recent 06/11/2017. Risk Factors:Family History of Coronary                  Artery Disease, Hypertension and Former Smoker.  Sonographer:     Dub Mikes Referring Phys:  [id number] Erma Heritage Diagnosing Phys: Candee Furbish MD IMPRESSIONS  1. Left ventricular ejection fraction, by estimation, is 40 to 45%. The left ventricle has mildly decreased function. The left ventricle demonstrates regional wall motion abnormalities (see scoring diagram/findings for description). Left ventricular diastolic parameters are consistent with Grade I diastolic dysfunction (impaired relaxation).  2. Right ventricular systolic function is normal. The right ventricular size is normal.  3. Left atrial size was moderately dilated.  4. The mitral valve is normal in structure. No evidence of mitral valve regurgitation. No evidence of mitral stenosis.  5. The aortic valve is normal in structure. Aortic valve regurgitation is not visualized. No aortic stenosis is present.  6. The inferior vena cava is normal in size with greater than 50% respiratory variability, suggesting right atrial pressure of 3 mmHg. FINDINGS  Left  Ventricle: Left ventricular ejection fraction, by estimation, is 40 to 45%.  The left ventricle has mildly decreased function. The left ventricle demonstrates regional wall motion abnormalities. The left ventricular internal cavity size was normal in size. There is no left ventricular hypertrophy. Left ventricular diastolic parameters are consistent with Grade I diastolic dysfunction (impaired relaxation).  LV Wall Scoring: The apical lateral segment, apical septal segment, and apex are akinetic. Right Ventricle: The right ventricular size is normal. No increase in right ventricular wall thickness. Right ventricular systolic function is normal. Left Atrium: Left atrial size was moderately dilated. Right Atrium: Right atrial size was normal in size. Pericardium: There is no evidence of pericardial effusion. Mitral Valve: The mitral valve is normal in structure. No evidence of mitral valve regurgitation. No evidence of mitral valve stenosis. MV peak gradient, 6.2 mmHg. The mean mitral valve gradient is 3.0 mmHg. Tricuspid Valve: The tricuspid valve is normal in structure. Tricuspid valve regurgitation is not demonstrated. No evidence of tricuspid stenosis. Aortic Valve: The aortic valve is normal in structure. Aortic valve regurgitation is not visualized. No aortic stenosis is present. Aortic valve mean gradient measures 3.0 mmHg. Aortic valve peak gradient measures 4.8 mmHg. Aortic valve area, by VTI measures 3.70 cm. Pulmonic Valve: The pulmonic valve was normal in structure. Pulmonic valve regurgitation is not visualized. No evidence of pulmonic stenosis. Aorta: The aortic root is normal in size and structure. Venous: The inferior vena cava is normal in size with greater than 50% respiratory variability, suggesting right atrial pressure of 3 mmHg. IAS/Shunts: No atrial level shunt detected by color flow Doppler.  LEFT VENTRICLE PLAX 2D LVIDd:         5.50 cm      Diastology LVIDs:         4.20 cm      LV e' medial:     6.64 cm/s LV PW:         0.70 cm      LV E/e' medial:  19.1 LV IVS:        0.80 cm      LV e' lateral:   7.40 cm/s LVOT diam:     2.30 cm      LV E/e' lateral: 17.2 LV SV:         87 LV SV Index:   54 LVOT Area:     4.15 cm  LV Volumes (MOD) LV vol d, MOD A2C: 176.0 ml LV vol d, MOD A4C: 156.0 ml LV vol s, MOD A2C: 111.0 ml LV vol s, MOD A4C: 81.7 ml LV SV MOD A2C:     65.0 ml LV SV MOD A4C:     156.0 ml LV SV MOD BP:      71.4 ml RIGHT VENTRICLE             IVC RV S prime:     10.40 cm/s  IVC diam: 1.20 cm TAPSE (M-mode): 2.1 cm LEFT ATRIUM             Index        RIGHT ATRIUM           Index LA diam:        4.40 cm 2.75 cm/m   RA Area:     12.10 cm LA Vol (A2C):   64.6 ml 40.32 ml/m  RA Volume:   24.70 ml  15.41 ml/m LA Vol (A4C):   74.5 ml 46.49 ml/m LA Biplane Vol: 72.3 ml 45.12 ml/m  AORTIC VALVE AV Area (Vmax):    3.78 cm AV Area (Vmean):  3.62 cm AV Area (VTI):     3.70 cm AV Vmax:           110.00 cm/s AV Vmean:          76.000 cm/s AV VTI:            0.235 m AV Peak Grad:      4.8 mmHg AV Mean Grad:      3.0 mmHg LVOT Vmax:         100.00 cm/s LVOT Vmean:        66.300 cm/s LVOT VTI:          0.209 m LVOT/AV VTI ratio: 0.89  AORTA Ao Root diam: 3.30 cm Ao Asc diam:  3.50 cm MITRAL VALVE                TRICUSPID VALVE MV Area (PHT): 2.22 cm     TR Peak grad:   9.4 mmHg MV Area VTI:   2.50 cm     TR Vmax:        153.00 cm/s MV Peak grad:  6.2 mmHg MV Mean grad:  3.0 mmHg     SHUNTS MV Vmax:       1.25 m/s     Systemic VTI:  0.21 m MV Vmean:      73.1 cm/s    Systemic Diam: 2.30 cm MV Decel Time: 342 msec MV E velocity: 127.00 cm/s MV A velocity: 136.00 cm/s MV E/A ratio:  0.93 Candee Furbish MD Electronically signed by Candee Furbish MD Signature Date/Time: 04/15/2021/1:36:06 PM    Final    Disposition   Pt is being discharged home today in good condition.  Follow-up Plans & Appointments     Follow-up Information     Liliane Shi, PA-C Follow up on 05/06/2021.   Specialties:  Cardiology, Physician Assistant Why: at 2:15pm for your follow up appt with Dr. Kyla Balzarine PA Contact information: [address] [place] [address] [phone]                Discharge Instructions     Diet - low sodium heart healthy   Complete by: As directed    Increase activity slowly   Complete by: As directed        Discharge Medications   Allergies as of 04/15/2021       Reactions   Penicillins Shortness Of Breath, Rash   Has patient had a PCN reaction causing immediate rash, facial/tongue/throat swelling, SOB or lightheadedness with hypotension: Yes Has patient had a PCN reaction causing severe rash involving mucus membranes or skin necrosis: No Has patient had a PCN reaction that required hospitalization: No Has patient had a PCN reaction occurring within the last 10 years: Yes If all of the above answers are "NO", then may proceed with Cephalosporin use.   Sulfa Antibiotics Hives, Shortness Of Breath, Rash   Adhesive [tape] Other (See Comments)   When removed, bad bruises result   Tramadol Nausea And Vomiting        Medication List     TAKE these medications    aspirin 81 MG tablet Take 1 tablet (81 mg total) by mouth daily.   atorvastatin 40 MG tablet Commonly known as: LIPITOR TAKE 1 TABLET EVERY DAY   carvedilol 6.25 MG tablet Commonly known as: COREG Take 1 tablet (6.25 mg total) by mouth 2 (two) times daily with a meal. What changed:  medication strength how much to take   furosemide  20 MG tablet Commonly known as: LASIX Take 1 tablet (20 mg total) by mouth daily as needed for fluid or edema.   gabapentin 100 MG capsule Commonly known as: NEURONTIN TAKE 1 CAPSULE TWICE DAILY What changed: when to take this   isosorbide mononitrate 30 MG 24 hr tablet Commonly known as: IMDUR Take 1 tablet (30 mg total) by mouth daily. Start taking on: April 16, 2021   lisinopril 2.5 MG tablet Commonly known as:  ZESTRIL TAKE 2 TABLETS (5 MG TOTAL) DAILY. What changed: See the new instructions.   multivitamin tablet Take 1 tablet by mouth daily.   nitroGLYCERIN 0.4 MG SL tablet Commonly known as: NITROSTAT Place 0.4 mg under the tongue every 5 (five) minutes as needed for chest pain. May take up to 3 doses.   sertraline 25 MG tablet Commonly known as: ZOLOFT TAKE 1 TABLET (25 MG TOTAL) BY MOUTH DAILY.   tamsulosin 0.4 MG Caps capsule Commonly known as: FLOMAX TAKE 1 CAPSULE EVERY DAY   traZODone 50 MG tablet Commonly known as: DESYREL TAKE 1 TABLET AT BEDTIME AS NEEDED FOR SLEEP. What changed:  reasons to take this additional instructions   vitamin B-12 1000 MCG tablet Commonly known as: CYANOCOBALAMIN Take 1,000 mcg by mouth daily.        Outstanding Labs/Studies   BMET at follow up appt  Duration of Discharge Encounter   Greater than 30 minutes including physician time.  Signed, Reino Bellis, NP 04/15/2021, 1:48 PM

## 2021-05-06 ENCOUNTER — Ambulatory Visit: Payer: Medicare HMO | Admitting: Physician Assistant

## 2021-05-06 NOTE — Progress Notes (Deleted)
Cardiology Office Note:    Date:  05/06/2021   ID:  Donald [name], DOB 10-02-1955, MRN [id number]  PCP:  Donald Button, MD   [facility] Providers Cardiologist:  Donald Rouge, MD { Click to update primary MD,subspecialty MD or APP then REFRESH:1}  *** Referring MD: Donald Button, MD   Chief Complaint:  No chief complaint on file. {Click here for Visit Info    :1}   Patient Profile:   Donald [name] is a 65 y.o. male with:  Coronary artery disease Known CTO of the LAD S/p CABG in 01/2019 at [place] (SVG-RCA, SVG-OM2, LIMA-LAD) HFmrEF (heart failure with mildly reduced ejection fraction)  Ischemic cardiomyopathy LV apical scar/aneurysm Echocardiogram 10/22: EF 40-45 Cardiac MRI 5/19: EF 44 History of CVA (probably embolic) S/p ILR Paroxysmal atrial fibrillation  History of cerebral aneurysm GERD Hypothyroidism Hypertension Chronic kidney disease stage III  History of Present Illness: Donald [name] was last seen by Donald [name] in 2019.   Since that time, he has been followed at [facility].  He did undergo CABG in August 2020 at [facility].  He was last seen at [facility] cardiology in 01/2021.  He was admitted to [facility] 10/14-10/17 with chest pain.  His troponins were essentially negative.  His chest pain was felt to be noncardiac and reproducible with palpation.  An echocardiogram demonstrated stable EF of 40-45.  No further testing was recommended.  He returns for follow-up***  ASSESSMENT & PLAN:   No problem-specific Assessment & Plan notes found for this encounter.        {Are you ordering a CV Procedure (e.g. stress test, cath, DCCV, TEE, etc)?   Press F2        :[id number]}   Dispo:  No follow-ups on file.    Prior CV studies: Echocardiogram 04/15/2021 EF 40-45, GR 1 DD, normal RVSF, moderate LAE, apical lateral/apical septal/apical AK  Carotid US 02/01/2019 ([facility]) No R ICA stenosis; L ICA 50-69  Cardiac MRI 11/11/2017 EF 44, apical inferior AK plus aneurysmal dilation  of apical septal/anterior/lateral walls and true apex LGE in apical septal, apical anterior, apical lateral walls and true apex Normal size of aortic root, ascending aorta and pulmonary artery Mild MR, trivial TR Mild posterior pericardial effusion {Select studies to display:26339}    Past Medical History:  Diagnosis Date   Cataract    Cerebral aneurysm without rupture    Followed by Donald [name], on Plavix   CHF (congestive heart failure) ([place])    Clostridium difficile colitis December 2015   Presented with sepsis, required stool transplant   Clostridium difficile colitis    Coronary artery disease    Depression    Diverticulosis 11/23/2014   GERD (gastroesophageal reflux disease)    Hypothyroidism    Myocardial infarction [facility])    2009, 2013,2017   Stroke [facility])    NOV 2015   Suicide attempt [facility])    Current Medications: No outpatient medications have been marked as taking for the 05/06/21 encounter (Appointment) with Donald [name] T, PA-C.    Allergies:   Penicillins, Sulfa antibiotics, Adhesive [tape], and Tramadol   Social History   Tobacco Use   Smoking status: Former    Types: Cigarettes    Quit date: 06/03/2008    Years since quitting: 12.9   Smokeless tobacco: Never  Vaping Use   Vaping Use: Never used  Substance Use Topics   Alcohol use: Not Currently   Drug use: No    Family Hx: The patient's  family history includes Cancer in his brother and mother; Heart disease in his maternal grandfather, maternal grandmother, paternal grandfather, paternal grandmother, and sister; Heart murmur in his father; Stroke in his brother, father, mother, and sister. There is no history of Colon cancer, Esophageal cancer, Stomach cancer, or Rectal cancer.  ROS   EKGs/Labs/Other Test Reviewed:    EKG:  EKG is *** ordered today.  The ekg ordered today demonstrates ***  Recent Labs: 04/15/2021: BUN 35; Creatinine, Ser 1.87; Hemoglobin 11.6; Platelets 200; Potassium 4.3; Sodium 136    Recent Lipid Panel Lab Results  Component Value Date/Time   CHOL 126 04/14/2021 02:30 AM   CHOL 101 05/25/2014 04:09 AM   TRIG 98 04/14/2021 02:30 AM   TRIG 103 05/25/2014 04:09 AM   HDL 36 (L) 04/14/2021 02:30 AM   HDL 27 (L) 05/25/2014 04:09 AM   LDLCALC 70 04/14/2021 02:30 AM   LDLCALC 53 05/25/2014 04:09 AM     Risk Assessment/Calculations:   {Does this patient have ATRIAL FIBRILLATION?[phone]}      Physical Exam:    VS:  There were no vitals taken for this visit.    Wt Readings from Last 3 Encounters:  04/15/21 128 lb 8.5 oz (58.3 kg)  06/29/18 148 lb (67.1 kg)  06/03/18 148 lb 4 oz (67.2 kg)    Physical Exam ***    Medication Adjustments/Labs and Tests Ordered: Current medicines are reviewed at length with the patient today.  Concerns regarding medicines are outlined above.  Tests Ordered: No orders of the defined types were placed in this encounter.  Medication Changes: No orders of the defined types were placed in this encounter.  Signed, Donald Dopp, PA-C  05/06/2021 1:01 PM    [facility] [address], [place], [place]  [address] Phone: [phone]; Fax: [phone]

## 2021-06-25 ENCOUNTER — Encounter: Payer: Self-pay | Admitting: Gastroenterology

## 2021-09-03 ENCOUNTER — Encounter: Payer: Self-pay | Admitting: Gastroenterology

## 2021-10-28 ENCOUNTER — Encounter (HOSPITAL_COMMUNITY): Payer: Self-pay

## 2021-10-28 ENCOUNTER — Inpatient Hospital Stay (HOSPITAL_COMMUNITY)
Admission: EM | Admit: 2021-10-28 | Discharge: 2021-11-05 | DRG: 871 | Disposition: A | Discharge status: Skilled Nursing Facility | Payer: Medicare HMO | Attending: Internal Medicine | Admitting: Internal Medicine

## 2021-10-28 ENCOUNTER — Other Ambulatory Visit: Payer: Self-pay

## 2021-10-28 ENCOUNTER — Emergency Department (HOSPITAL_COMMUNITY): Payer: Medicare HMO

## 2021-10-28 DIAGNOSIS — Z20822 Contact with and (suspected) exposure to covid-19: Secondary | ICD-10-CM | POA: Diagnosis present

## 2021-10-28 DIAGNOSIS — I130 Hypertensive heart and chronic kidney disease with heart failure and stage 1 through stage 4 chronic kidney disease, or unspecified chronic kidney disease: Secondary | ICD-10-CM | POA: Diagnosis present

## 2021-10-28 DIAGNOSIS — Z91148 Patient's other noncompliance with medication regimen for other reason: Secondary | ICD-10-CM

## 2021-10-28 DIAGNOSIS — J449 Chronic obstructive pulmonary disease, unspecified: Secondary | ICD-10-CM | POA: Diagnosis present

## 2021-10-28 DIAGNOSIS — Z681 Body mass index (BMI) 19 or less, adult: Secondary | ICD-10-CM

## 2021-10-28 DIAGNOSIS — E440 Moderate protein-calorie malnutrition: Secondary | ICD-10-CM | POA: Insufficient documentation

## 2021-10-28 DIAGNOSIS — I2510 Atherosclerotic heart disease of native coronary artery without angina pectoris: Secondary | ICD-10-CM | POA: Diagnosis present

## 2021-10-28 DIAGNOSIS — J9601 Acute respiratory failure with hypoxia: Secondary | ICD-10-CM | POA: Diagnosis present

## 2021-10-28 DIAGNOSIS — Z882 Allergy status to sulfonamides status: Secondary | ICD-10-CM

## 2021-10-28 DIAGNOSIS — J90 Pleural effusion, not elsewhere classified: Secondary | ICD-10-CM

## 2021-10-28 DIAGNOSIS — I671 Cerebral aneurysm, nonruptured: Secondary | ICD-10-CM | POA: Diagnosis present

## 2021-10-28 DIAGNOSIS — A419 Sepsis, unspecified organism: Secondary | ICD-10-CM | POA: Diagnosis present

## 2021-10-28 DIAGNOSIS — E878 Other disorders of electrolyte and fluid balance, not elsewhere classified: Secondary | ICD-10-CM | POA: Diagnosis not present

## 2021-10-28 DIAGNOSIS — R531 Weakness: Secondary | ICD-10-CM

## 2021-10-28 DIAGNOSIS — F0154 Vascular dementia, unspecified severity, with anxiety: Secondary | ICD-10-CM | POA: Diagnosis present

## 2021-10-28 DIAGNOSIS — R64 Cachexia: Secondary | ICD-10-CM | POA: Diagnosis present

## 2021-10-28 DIAGNOSIS — I959 Hypotension, unspecified: Secondary | ICD-10-CM | POA: Diagnosis not present

## 2021-10-28 DIAGNOSIS — N1832 Chronic kidney disease, stage 3b: Secondary | ICD-10-CM | POA: Diagnosis present

## 2021-10-28 DIAGNOSIS — Z880 Allergy status to penicillin: Secondary | ICD-10-CM

## 2021-10-28 DIAGNOSIS — Z66 Do not resuscitate: Secondary | ICD-10-CM | POA: Diagnosis present

## 2021-10-28 DIAGNOSIS — I5043 Acute on chronic combined systolic (congestive) and diastolic (congestive) heart failure: Secondary | ICD-10-CM

## 2021-10-28 DIAGNOSIS — E039 Hypothyroidism, unspecified: Secondary | ICD-10-CM | POA: Diagnosis present

## 2021-10-28 DIAGNOSIS — R1312 Dysphagia, oropharyngeal phase: Secondary | ICD-10-CM | POA: Diagnosis present

## 2021-10-28 DIAGNOSIS — Z7982 Long term (current) use of aspirin: Secondary | ICD-10-CM

## 2021-10-28 DIAGNOSIS — F0153 Vascular dementia, unspecified severity, with mood disturbance: Secondary | ICD-10-CM | POA: Diagnosis present

## 2021-10-28 DIAGNOSIS — R6520 Severe sepsis without septic shock: Secondary | ICD-10-CM | POA: Diagnosis present

## 2021-10-28 DIAGNOSIS — J690 Pneumonitis due to inhalation of food and vomit: Secondary | ICD-10-CM | POA: Diagnosis present

## 2021-10-28 DIAGNOSIS — E785 Hyperlipidemia, unspecified: Secondary | ICD-10-CM | POA: Diagnosis present

## 2021-10-28 DIAGNOSIS — I482 Chronic atrial fibrillation, unspecified: Secondary | ICD-10-CM | POA: Diagnosis present

## 2021-10-28 DIAGNOSIS — R54 Age-related physical debility: Secondary | ICD-10-CM | POA: Diagnosis present

## 2021-10-28 DIAGNOSIS — E1122 Type 2 diabetes mellitus with diabetic chronic kidney disease: Secondary | ICD-10-CM | POA: Diagnosis present

## 2021-10-28 DIAGNOSIS — Z79899 Other long term (current) drug therapy: Secondary | ICD-10-CM

## 2021-10-28 DIAGNOSIS — I472 Ventricular tachycardia, unspecified: Secondary | ICD-10-CM | POA: Diagnosis present

## 2021-10-28 DIAGNOSIS — N1831 Chronic kidney disease, stage 3a: Secondary | ICD-10-CM | POA: Diagnosis present

## 2021-10-28 DIAGNOSIS — F419 Anxiety disorder, unspecified: Secondary | ICD-10-CM | POA: Diagnosis present

## 2021-10-28 DIAGNOSIS — I480 Paroxysmal atrial fibrillation: Secondary | ICD-10-CM | POA: Diagnosis present

## 2021-10-28 DIAGNOSIS — I5042 Chronic combined systolic (congestive) and diastolic (congestive) heart failure: Secondary | ICD-10-CM | POA: Diagnosis not present

## 2021-10-28 DIAGNOSIS — J918 Pleural effusion in other conditions classified elsewhere: Secondary | ICD-10-CM | POA: Diagnosis present

## 2021-10-28 DIAGNOSIS — I4729 Other ventricular tachycardia: Secondary | ICD-10-CM | POA: Diagnosis not present

## 2021-10-28 DIAGNOSIS — J189 Pneumonia, unspecified organism: Secondary | ICD-10-CM | POA: Diagnosis not present

## 2021-10-28 DIAGNOSIS — Z7901 Long term (current) use of anticoagulants: Secondary | ICD-10-CM

## 2021-10-28 DIAGNOSIS — Z885 Allergy status to narcotic agent status: Secondary | ICD-10-CM

## 2021-10-28 DIAGNOSIS — F32A Depression, unspecified: Secondary | ICD-10-CM | POA: Diagnosis present

## 2021-10-28 DIAGNOSIS — I255 Ischemic cardiomyopathy: Secondary | ICD-10-CM | POA: Diagnosis present

## 2021-10-28 DIAGNOSIS — K5790 Diverticulosis of intestine, part unspecified, without perforation or abscess without bleeding: Secondary | ICD-10-CM | POA: Diagnosis present

## 2021-10-28 DIAGNOSIS — Z95818 Presence of other cardiac implants and grafts: Secondary | ICD-10-CM

## 2021-10-28 DIAGNOSIS — Z8249 Family history of ischemic heart disease and other diseases of the circulatory system: Secondary | ICD-10-CM

## 2021-10-28 DIAGNOSIS — Z87891 Personal history of nicotine dependence: Secondary | ICD-10-CM

## 2021-10-28 DIAGNOSIS — R627 Adult failure to thrive: Secondary | ICD-10-CM | POA: Diagnosis present

## 2021-10-28 DIAGNOSIS — I493 Ventricular premature depolarization: Secondary | ICD-10-CM | POA: Diagnosis present

## 2021-10-28 DIAGNOSIS — R0609 Other forms of dyspnea: Secondary | ICD-10-CM | POA: Diagnosis not present

## 2021-10-28 DIAGNOSIS — T464X6A Underdosing of angiotensin-converting-enzyme inhibitors, initial encounter: Secondary | ICD-10-CM | POA: Diagnosis present

## 2021-10-28 DIAGNOSIS — K219 Gastro-esophageal reflux disease without esophagitis: Secondary | ICD-10-CM | POA: Diagnosis present

## 2021-10-28 DIAGNOSIS — Z9151 Personal history of suicidal behavior: Secondary | ICD-10-CM

## 2021-10-28 DIAGNOSIS — E876 Hypokalemia: Secondary | ICD-10-CM | POA: Diagnosis not present

## 2021-10-28 DIAGNOSIS — F01518 Vascular dementia, unspecified severity, with other behavioral disturbance: Secondary | ICD-10-CM | POA: Diagnosis present

## 2021-10-28 DIAGNOSIS — Z8673 Personal history of transient ischemic attack (TIA), and cerebral infarction without residual deficits: Secondary | ICD-10-CM

## 2021-10-28 DIAGNOSIS — I252 Old myocardial infarction: Secondary | ICD-10-CM

## 2021-10-28 DIAGNOSIS — Z91048 Other nonmedicinal substance allergy status: Secondary | ICD-10-CM

## 2021-10-28 DIAGNOSIS — Z951 Presence of aortocoronary bypass graft: Secondary | ICD-10-CM

## 2021-10-28 DIAGNOSIS — Z823 Family history of stroke: Secondary | ICD-10-CM

## 2021-10-28 LAB — LIPASE, BLOOD: Lipase: 58 U/L — ABNORMAL HIGH (ref 11–51)

## 2021-10-28 LAB — RESP PANEL BY RT-PCR (FLU A&B, COVID) ARPGX2
Influenza A by PCR: NEGATIVE
Influenza B by PCR: NEGATIVE
SARS Coronavirus 2 by RT PCR: NEGATIVE

## 2021-10-28 LAB — URINALYSIS, ROUTINE W REFLEX MICROSCOPIC
Bacteria, UA: NONE SEEN
Bilirubin Urine: NEGATIVE
Glucose, UA: NEGATIVE mg/dL
Hgb urine dipstick: NEGATIVE
Ketones, ur: 20 mg/dL — AB
Leukocytes,Ua: NEGATIVE
Nitrite: NEGATIVE
Protein, ur: 30 mg/dL — AB
Specific Gravity, Urine: >1.046 — ABNORMAL HIGH (ref 1.005–1.030)
pH: 5 (ref 5.0–8.0)

## 2021-10-28 LAB — CBC WITH DIFFERENTIAL/PLATELET
Abs Immature Granulocytes: 0.03 10*3/uL (ref 0.00–0.07)
Basophils Absolute: 0.1 10*3/uL (ref 0.0–0.1)
Basophils Relative: 1 %
Eosinophils Absolute: 0.1 10*3/uL (ref 0.0–0.5)
Eosinophils Relative: 1 %
HCT: 48.8 % (ref 39.0–52.0)
Hemoglobin: 16 g/dL (ref 13.0–17.0)
Immature Granulocytes: 0 %
Lymphocytes Relative: 22 %
Lymphs Abs: 1.9 10*3/uL (ref 0.7–4.0)
MCH: 27.8 pg (ref 26.0–34.0)
MCHC: 32.8 g/dL (ref 30.0–36.0)
MCV: 84.9 fL (ref 80.0–100.0)
Monocytes Absolute: 0.7 10*3/uL (ref 0.1–1.0)
Monocytes Relative: 8 %
Neutro Abs: 5.8 10*3/uL (ref 1.7–7.7)
Neutrophils Relative %: 68 %
Platelets: 308 10*3/uL (ref 150–400)
RBC: 5.75 MIL/uL (ref 4.22–5.81)
RDW: 13.6 % (ref 11.5–15.5)
WBC: 8.5 10*3/uL (ref 4.0–10.5)
nRBC: 0 % (ref 0.0–0.2)

## 2021-10-28 LAB — PROCALCITONIN: Procalcitonin: <0.1 ng/mL

## 2021-10-28 LAB — COMPREHENSIVE METABOLIC PANEL
ALT: 25 U/L (ref 0–44)
AST: 24 U/L (ref 15–41)
Albumin: 3.4 g/dL — ABNORMAL LOW (ref 3.5–5.0)
Alkaline Phosphatase: 17 U/L — ABNORMAL LOW (ref 38–126)
Anion gap: 13 (ref 5–15)
BUN: 18 mg/dL (ref 8–23)
CO2: 21 mmol/L — ABNORMAL LOW (ref 22–32)
Calcium: 9.5 mg/dL (ref 8.9–10.3)
Chloride: 104 mmol/L (ref 98–111)
Creatinine, Ser: 1.61 mg/dL — ABNORMAL HIGH (ref 0.61–1.24)
GFR, Estimated: 47 mL/min — ABNORMAL LOW (ref >60)
Glucose, Bld: 146 mg/dL — ABNORMAL HIGH (ref 70–99)
Potassium: 3.6 mmol/L (ref 3.5–5.1)
Sodium: 138 mmol/L (ref 135–145)
Total Bilirubin: 1.1 mg/dL (ref 0.3–1.2)
Total Protein: 7.2 g/dL (ref 6.5–8.1)

## 2021-10-28 LAB — TROPONIN I (HIGH SENSITIVITY)
Troponin I (High Sensitivity): 49 ng/L — ABNORMAL HIGH (ref <18)
Troponin I (High Sensitivity): 41 ng/L — ABNORMAL HIGH (ref <18)

## 2021-10-28 LAB — STREP PNEUMONIAE URINARY ANTIGEN: Strep Pneumo Urinary Antigen: NEGATIVE

## 2021-10-28 LAB — LACTIC ACID, PLASMA
Lactic Acid, Venous: 2.9 mmol/L (ref 0.5–1.9)
Lactic Acid, Venous: 1.5 mmol/L (ref 0.5–1.9)

## 2021-10-28 LAB — TSH: TSH: 0.96 u[IU]/mL (ref 0.350–4.500)

## 2021-10-28 MED ORDER — ACETAMINOPHEN 650 MG RE SUPP
650.0000 mg | Freq: Four times a day (QID) | RECTAL | Status: DC | PRN
Start: 2021-10-28 — End: 2021-11-05
  Filled 2021-10-28: qty 1

## 2021-10-28 MED ORDER — BISACODYL 5 MG PO TBEC: 5.0000 mg | DELAYED_RELEASE_TABLET | Freq: Every day | ORAL | Status: DC | PRN

## 2021-10-28 MED ORDER — IPRATROPIUM BROMIDE HFA 17 MCG/ACT IN AERS
2.0000 | INHALATION_SPRAY | Freq: Once | RESPIRATORY_TRACT | Status: AC
  Administered 2021-10-28: 2 via RESPIRATORY_TRACT
  Filled 2021-10-28: qty 12.9

## 2021-10-28 MED ORDER — ENOXAPARIN SODIUM 40 MG/0.4ML IJ SOSY
40.0000 mg | PREFILLED_SYRINGE | INTRAMUSCULAR | Status: DC
  Administered 2021-10-29: 40 mg via SUBCUTANEOUS
  Filled 2021-10-28: qty 0.4

## 2021-10-28 MED ORDER — LACTATED RINGERS IV SOLN: INTRAVENOUS | Status: DC

## 2021-10-28 MED ORDER — ONDANSETRON HCL 4 MG PO TABS: 4.0000 mg | ORAL_TABLET | Freq: Four times a day (QID) | ORAL | Status: DC | PRN

## 2021-10-28 MED ORDER — IOHEXOL 350 MG/ML SOLN
60.0000 mL | Freq: Once | INTRAVENOUS | Status: AC | PRN
  Administered 2021-10-28: 60 mL via INTRAVENOUS

## 2021-10-28 MED ORDER — SODIUM CHLORIDE 0.9 % IV SOLN: INTRAVENOUS | Status: DC

## 2021-10-28 MED ORDER — HYDRALAZINE HCL 20 MG/ML IJ SOLN: 5.0000 mg | INTRAMUSCULAR | Status: DC | PRN

## 2021-10-28 MED ORDER — ACETAMINOPHEN 325 MG PO TABS: 650.0000 mg | ORAL_TABLET | Freq: Four times a day (QID) | ORAL | Status: DC | PRN

## 2021-10-28 MED ORDER — TRAZODONE HCL 50 MG PO TABS
50.0000 mg | ORAL_TABLET | Freq: Every evening | ORAL | Status: DC | PRN
  Administered 2021-10-28 – 2021-10-31 (×3): 50 mg via ORAL
  Filled 2021-10-28 (×3): qty 1

## 2021-10-28 MED ORDER — LEVOFLOXACIN IN D5W 750 MG/150ML IV SOLN
750.0000 mg | Freq: Once | INTRAVENOUS | Status: AC
  Administered 2021-10-28: 750 mg via INTRAVENOUS
  Filled 2021-10-28: qty 150

## 2021-10-28 MED ORDER — ATORVASTATIN CALCIUM 40 MG PO TABS
40.0000 mg | ORAL_TABLET | Freq: Every day | ORAL | Status: DC
  Administered 2021-10-29 – 2021-11-05 (×8): 40 mg via ORAL
  Filled 2021-10-28 (×8): qty 1

## 2021-10-28 MED ORDER — ALBUTEROL SULFATE (2.5 MG/3ML) 0.083% IN NEBU: 2.5000 mg | INHALATION_SOLUTION | RESPIRATORY_TRACT | Status: DC | PRN

## 2021-10-28 MED ORDER — DOCUSATE SODIUM 100 MG PO CAPS
100.0000 mg | ORAL_CAPSULE | Freq: Two times a day (BID) | ORAL | Status: DC
  Administered 2021-10-28 – 2021-11-05 (×10): 100 mg via ORAL
  Filled 2021-10-28 (×12): qty 1

## 2021-10-28 MED ORDER — OXYCODONE HCL 5 MG PO TABS
5.0000 mg | ORAL_TABLET | ORAL | Status: DC | PRN
  Administered 2021-10-29: 5 mg via ORAL
  Filled 2021-10-28: qty 1

## 2021-10-28 MED ORDER — GABAPENTIN 100 MG PO CAPS
100.0000 mg | ORAL_CAPSULE | Freq: Every day | ORAL | Status: DC
  Administered 2021-10-29 – 2021-11-05 (×8): 100 mg via ORAL
  Filled 2021-10-28 (×8): qty 1

## 2021-10-28 MED ORDER — ISOSORBIDE MONONITRATE ER 30 MG PO TB24
30.0000 mg | ORAL_TABLET | Freq: Every day | ORAL | Status: DC
  Administered 2021-10-29 – 2021-11-05 (×8): 30 mg via ORAL
  Filled 2021-10-28 (×8): qty 1

## 2021-10-28 MED ORDER — SODIUM CHLORIDE 0.9% FLUSH
3.0000 mL | Freq: Two times a day (BID) | INTRAVENOUS | Status: DC
  Administered 2021-10-28 – 2021-11-04 (×9): 3 mL via INTRAVENOUS

## 2021-10-28 MED ORDER — ONDANSETRON HCL 4 MG/2ML IJ SOLN
4.0000 mg | Freq: Four times a day (QID) | INTRAMUSCULAR | Status: DC | PRN
Start: 2021-10-28 — End: 2021-11-05

## 2021-10-28 MED ORDER — POLYETHYLENE GLYCOL 3350 17 G PO PACK: 17.0000 g | PACK | Freq: Every day | ORAL | Status: DC | PRN

## 2021-10-28 MED ORDER — LEVOFLOXACIN IN D5W 750 MG/150ML IV SOLN
750.0000 mg | INTRAVENOUS | Status: DC
Start: 2021-10-30 — End: 2021-10-29

## 2021-10-28 MED ORDER — METHYLPREDNISOLONE SODIUM SUCC 125 MG IJ SOLR
125.0000 mg | Freq: Once | INTRAMUSCULAR | Status: AC
  Administered 2021-10-28: 125 mg via INTRAVENOUS
  Filled 2021-10-28: qty 2

## 2021-10-28 MED ORDER — GUAIFENESIN ER 600 MG PO TB12
600.0000 mg | ORAL_TABLET | Freq: Two times a day (BID) | ORAL | Status: DC | PRN
  Administered 2021-10-31 (×2): 600 mg via ORAL
  Filled 2021-10-28 (×2): qty 1

## 2021-10-28 MED ORDER — CARVEDILOL 6.25 MG PO TABS
6.2500 mg | ORAL_TABLET | Freq: Two times a day (BID) | ORAL | Status: DC
  Administered 2021-10-29 – 2021-11-05 (×15): 6.25 mg via ORAL
  Filled 2021-10-28 (×11): qty 1
  Filled 2021-10-28: qty 2
  Filled 2021-10-28 (×3): qty 1

## 2021-10-28 MED ORDER — ASPIRIN 81 MG PO CHEW
81.0000 mg | CHEWABLE_TABLET | Freq: Every day | ORAL | Status: DC
  Administered 2021-10-29 – 2021-11-02 (×3): 81 mg via ORAL
  Filled 2021-10-28 (×5): qty 1

## 2021-10-28 NOTE — ED Notes (Signed)
This nurse assumes care of patient. Patient is alert and oriented and calm. Patient has cell phone, hospital phone, and call light within reach. Patient denies any concerns, complaints, or pain at this time. Patient is in no obvious distress and remains on bedside monitor.  ?

## 2021-10-28 NOTE — ED Provider Notes (Signed)
?[facility] ?Provider Note ? ? ?CSN: [id number] ?Arrival date & time: 10/28/21  0856 ? ?  ? ?History ? ?Chief Complaint  ?Patient presents with  ? Shortness of Breath  ? Chest Pain  ? ? ?Donald [name] is a 66 y.o. male. ? ?66 y.o male with a PMH of COPD, CAD, presents to the ED with a chief complaint of shortness of breath, vomiting for the past month.  Previously seen in the ED, however according to family member at the bedside, patient has not been himself lately, states that he feels very short of breath with any sort of ambulation.  Does not want to walk around his home like he usually would. He does have an underlying history of COPD, reports compliance with medication, and discontinue tobacco use approximately 30 years ago.  Does endorse some subjective fevers at home. Along with some lower abdominal pain along with decrease in oral intake.  Not endorse any sick contacts, no chest pain, no leg swelling.  Not anticoagulated. ? ?The history is provided by the patient and a relative.  ?Shortness of Breath ?Severity:  Moderate ?Onset quality:  Gradual ?Duration:  1 month ?Timing:  Constant ?Progression:  Worsening ?Chronicity:  New ?Context: activity   ?Context: not smoke exposure   ?Associated symptoms: abdominal pain, chest pain and fever   ?Associated symptoms: no sore throat and no vomiting   ?Chest Pain ?Associated symptoms: abdominal pain, fever and shortness of breath   ?Associated symptoms: no back pain, no nausea and no vomiting   ? ?  ? ?Home Medications ?Prior to Admission medications   ?Medication Sig Start Date End Date Taking? Authorizing Provider  ?apixaban (ELIQUIS) 5 MG TABS tablet Take 5 mg by mouth 2 (two) times daily.   Yes [provider]  ?atorvastatin (LIPITOR) 40 MG tablet TAKE 1 TABLET EVERY DAY ?Patient taking differently: Take 40 mg by mouth daily. 09/27/18  Yes Glenis Smoker, MD  ?donepezil (ARICEPT) 10 MG tablet Take 10 mg by  mouth at bedtime.   Yes [provider]  ?mirtazapine (REMERON) 15 MG tablet Take 15 mg by mouth at bedtime.   Yes [provider]  ?sertraline (ZOLOFT) 25 MG tablet TAKE 1 TABLET (25 MG TOTAL) BY MOUTH DAILY. ?Patient taking differently: Take 12.5 mg by mouth daily. 09/27/18  Yes Glenis Smoker, MD  ?spironolactone (ALDACTONE) 25 MG tablet Take 12.5 mg by mouth daily.   Yes [provider]  ?tamsulosin (FLOMAX) 0.4 MG CAPS capsule TAKE 1 CAPSULE EVERY DAY ?Patient taking differently: Take 0.4 mg by mouth daily. 12/12/17  Yes Glenis Smoker, MD  ?aspirin 81 MG tablet Take 1 tablet (81 mg total) by mouth daily. ?Patient not taking: Reported on 10/28/2021 02/08/18   Frann Rider, NP  ?carvedilol (COREG) 6.25 MG tablet Take 1 tablet (6.25 mg total) by mouth 2 (two) times daily with a meal. ?Patient not taking: Reported on 10/28/2021 04/15/21   Cheryln Manly, NP  ?furosemide (LASIX) 20 MG tablet Take 1 tablet (20 mg total) by mouth daily as needed for fluid or edema. ?Patient not taking: Reported on 10/28/2021 02/22/18   Croitoru, Mihai, MD  ?gabapentin (NEURONTIN) 100 MG capsule TAKE 1 CAPSULE TWICE DAILY ?Patient not taking: Reported on 10/28/2021 11/06/17   Glenis Smoker, MD  ?isosorbide mononitrate (IMDUR) 30 MG 24 hr tablet Take 1 tablet (30 mg total) by mouth daily. ?Patient not taking: Reported on 10/28/2021 04/16/21   Mancel Bale,  Rosalene Billings, NP  ?lisinopril (PRINIVIL,ZESTRIL) 2.5 MG tablet TAKE 2 TABLETS (5 MG TOTAL) DAILY. ?Patient not taking: Reported on 10/28/2021 09/27/18   Croitoru, Mihai, MD  ?traZODone (DESYREL) 50 MG tablet TAKE 1 TABLET AT BEDTIME AS NEEDED FOR SLEEP. ?Patient not taking: Reported on 10/28/2021 09/27/18   Glenis Smoker, MD  ?   ? ?Allergies    ?Penicillins, Sulfa antibiotics, Adhesive [tape], and Tramadol   ? ?Review of Systems   ?Review of Systems  ?Constitutional:  Positive for chills and fever.  ?HENT:  Negative for sore throat.   ?Respiratory:   Positive for shortness of breath.   ?Cardiovascular:  Positive for chest pain.  ?Gastrointestinal:  Positive for abdominal pain. Negative for blood in stool, nausea and vomiting.  ?Genitourinary:  Negative for difficulty urinating and flank pain.  ?Musculoskeletal:  Negative for back pain.  ?Skin:  Negative for pallor and wound.  ?All other systems reviewed and are negative. ? ?Physical Exam ?Updated Vital Signs ?BP 121/81   Pulse (!) 103   Temp (!) 97.3 ?F (36.3 ?C) (Rectal)   Resp (!) 23   Ht '5\' 3"'$  (1.6 m)   Wt 58.3 kg   SpO2 96%   BMI 22.77 kg/m?  ?Physical Exam ?Vitals and nursing note reviewed.  ?Constitutional:   ?   Appearance: He is well-developed. He is ill-appearing.  ?HENT:  ?   Head: Normocephalic and atraumatic.  ?Cardiovascular:  ?   Rate and Rhythm: Tachycardia present.  ?Pulmonary:  ?   Effort: Pulmonary effort is normal. Tachypnea present.  ?   Breath sounds: Decreased breath sounds and wheezing present.  ?Chest:  ?   Chest wall: No tenderness.  ?Abdominal:  ?   Palpations: Abdomen is soft.  ?   Tenderness: There is abdominal tenderness.  ?Musculoskeletal:  ?   Cervical back: Normal range of motion and neck supple.  ?   Right lower leg: No edema.  ?   Left lower leg: No edema.  ?Skin: ?   General: Skin is warm and dry.  ?Neurological:  ?   Mental Status: He is alert and oriented to person, place, and time.  ? ? ?ED Results / Procedures / Treatments   ?Labs ?(all labs ordered are listed, but only abnormal results are displayed) ?Labs Reviewed  ?COMPREHENSIVE METABOLIC PANEL - Abnormal; Notable for the following components:  ?    Result Value  ? CO2 21 (*)   ? Glucose, Bld 146 (*)   ? Creatinine, Ser 1.61 (*)   ? Albumin 3.4 (*)   ? Alkaline Phosphatase 17 (*)   ? GFR, Estimated 47 (*)   ? All other components within normal limits  ?LIPASE, BLOOD - Abnormal; Notable for the following components:  ? Lipase 58 (*)   ? All other components within normal limits  ?URINALYSIS, ROUTINE W REFLEX  MICROSCOPIC - Abnormal; Notable for the following components:  ? APPearance CLOUDY (*)   ? Specific Gravity, Urine >1.046 (*)   ? Ketones, ur 20 (*)   ? Protein, ur 30 (*)   ? All other components within normal limits  ?LACTIC ACID, PLASMA - Abnormal; Notable for the following components:  ? Lactic Acid, Venous 2.9 (*)   ? All other components within normal limits  ?TROPONIN I (HIGH SENSITIVITY) - Abnormal; Notable for the following components:  ? Troponin I (High Sensitivity) 49 (*)   ? All other components within normal limits  ?TROPONIN I (HIGH SENSITIVITY) - Abnormal; Notable  for the following components:  ? Troponin I (High Sensitivity) 41 (*)   ? All other components within normal limits  ?RESP PANEL BY RT-PCR (FLU A&B, COVID) ARPGX2  ?CULTURE, BLOOD (SINGLE)  ?EXPECTORATED SPUTUM ASSESSMENT W GRAM STAIN, RFLX TO RESP C  ?CBC WITH DIFFERENTIAL/PLATELET  ?LACTIC ACID, PLASMA  ?STREP PNEUMONIAE URINARY ANTIGEN  ?PROCALCITONIN  ?TSH  ?LEGIONELLA PNEUMOPHILA SEROGP 1 UR AG  ?PROCALCITONIN  ?QUANTIFERON-TB GOLD PLUS  ? ? ?EKG ?EKG Interpretation ? ?Date/Time:  Monday Oct 28 2021 08:50:58 EDT ?Ventricular Rate:  108 ?PR Interval:  142 ?QRS Duration: 96 ?QT Interval:  364 ?QTC Calculation: 487 ?R Axis:   97 ?Text Interpretation: Sinus tachycardia with frequent Premature ventricular complexes Rightward axis Minimal voltage criteria for LVH, may be normal variant ( Cornell product ) Anteroseptal infarct , age undetermined ST & T wave abnormality, consider inferolateral ischemia Abnormal ECG When compared with ECG of 15-Apr-2021 09:22, HEART RATE has increased Premature ventricular complexes are now present ST-t abnormality is now present Confirmed by Delora Fuel ([id number]) on 10/29/2021 3:03:08 AM ? ?Radiology ?DG Chest 2 View ? ?Result Date: 10/28/2021 ?CLINICAL DATA:  Chest pain. EXAM: CHEST - 2 VIEW COMPARISON:  Chest x-ray April 12, 2021. FINDINGS: Small to moderate bilateral layering pleural effusions. Overlying  bibasilar opacities. No visible pneumothorax. Cardiomediastinal silhouette is within normal limits. CABG and median sternotomy. Loop recorder projects over left chest. IMPRESSION: Small to moderate bilateral layering ple

## 2021-10-28 NOTE — ED Provider Triage Note (Signed)
Emergency Medicine Provider Triage Evaluation Note ? ?Donald [name] , a 65 y.o. male  was evaluated in triage.  Pt complains of vomiting x 1 month, seen in the ER and dc home. Lives with nephew who was concerned he wasn't getting better and sent back to the ER. Unsure what hospital he went to, no ER records in careeverywhere recently.  ? ?Review of Systems  ?Positive: Vomiting, fever (102, last week), chills, changes in bowels ("all over the place") ?Negative: No blood in emesis or stools, abdominal pain ? ?Physical Exam  ?Ht '5\' 3"'$  (1.6 m)   Wt 58.3 kg   BMI 22.77 kg/m?  ?Gen:   Awake, no distress   ?Resp:  Normal effort  ?MSK:   Moves extremities without difficulty  ?Other:   ? ?Medical Decision Making  ?Medically screening exam initiated at 9:13 AM.  Appropriate orders placed.  Donald [name] was informed that the remainder of the evaluation will be completed by another provider, this initial triage assessment does not replace that evaluation, and the importance of remaining in the ED until their evaluation is complete. ? ? ?  ?Tacy Learn, PA-C ?10/28/21 0913 ? ?

## 2021-10-28 NOTE — ED Notes (Signed)
Patient to CT via stretcher.

## 2021-10-28 NOTE — Progress Notes (Signed)
Pharmacy Antibiotic Note ? ?Donald [name] is a 66 y.o. male for which pharmacy has been consulted for levaquin dosing for pneumonia. Patient with pcn allergy (SOB, Rash) documented without documented history of tolerating cephalosporins. ? ?Patient with a history of COPD, CAD. Patient presenting with SOB. ? ?SCr 1.61 -  baseline ?WBC 8.5; T 96.2 F; HR 107; RR 19 ?COVID/Flu - neg ? ?Plan: ?Levaquin '750mg'$  q48h ?Trend WBC, Fever, Renal function, & Clinical course ?F/u cultures, clinical course, WBC, fever ?De-escalate when able ? ?Height: '5\' 3"'$  (160 cm) ?Weight: 58.3 kg (128 lb 8.5 oz) ?IBW/kg (Calculated) : 56.9 ? ?Temp (24hrs), Avg:96.2 ?F (35.7 ?C), Min:96.2 ?F (35.7 ?C), Max:96.2 ?F (35.7 ?C) ? ?Recent Labs  ?Lab 10/28/21 ?0926  ?WBC 8.5  ?CREATININE 1.61*  ?  ?Estimated Creatinine Clearance: 36.3 mL/min (A) (by C-G formula based on SCr of 1.61 mg/dL (H)).   ? ?Allergies  ?Allergen Reactions  ? Penicillins Shortness Of Breath and Rash  ?  Has patient had a PCN reaction causing immediate rash, facial/tongue/throat swelling, SOB or lightheadedness with hypotension: Yes ?Has patient had a PCN reaction causing severe rash involving mucus membranes or skin necrosis: No ?Has patient had a PCN reaction that required hospitalization: No ?Has patient had a PCN reaction occurring within the last 10 years: Yes ?If all of the above answers are "NO", then may proceed with Cephalosporin use. ?  ? Sulfa Antibiotics Hives, Shortness Of Breath and Rash  ? Adhesive [Tape] Other (See Comments)  ?  When removed, bad bruises result  ? Tramadol Nausea And Vomiting  ? ? ?Antimicrobials this admission: ?levaquin 5/1 >> ? ?Microbiology results: ?Pending ? ?Thank you for allowing pharmacy to be a part of this patient?s care. ? ?Lorelei Pont, PharmD, BCPS ?10/28/2021 1:42 PM ?ED Clinical Pharmacist -  [phone] ?  ?

## 2021-10-28 NOTE — ED Notes (Signed)
Spoke with Lab regarding patient gold quant test. Lab requests blood not be send until tomorrow morning d/t this being a lab that must be transported to [facility], and transportation takes place at 10AM each morning. Lab reports if specimen is sent at this time it will be hemolyzed.  ?

## 2021-10-28 NOTE — ED Notes (Signed)
Pt ambulated to the restroom was unable to urine or make a bowel movement. ?

## 2021-10-28 NOTE — ED Triage Notes (Signed)
Pt reports cold like symptoms for the past 2 weeks that has now caused pain in his chest with sob. Resp e.u at this time ?

## 2021-10-28 NOTE — Sepsis Progress Note (Signed)
Sepsis protocol is being followed by [facility]. 
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
## 2021-10-28 NOTE — ED Notes (Signed)
Took rectal temp was 96.2. Gave pt warm blankets. ?

## 2021-10-29 ENCOUNTER — Inpatient Hospital Stay (HOSPITAL_COMMUNITY): Payer: Medicare HMO

## 2021-10-29 DIAGNOSIS — R0609 Other forms of dyspnea: Secondary | ICD-10-CM | POA: Diagnosis not present

## 2021-10-29 DIAGNOSIS — R531 Weakness: Secondary | ICD-10-CM

## 2021-10-29 DIAGNOSIS — J189 Pneumonia, unspecified organism: Secondary | ICD-10-CM | POA: Diagnosis not present

## 2021-10-29 DIAGNOSIS — A419 Sepsis, unspecified organism: Secondary | ICD-10-CM | POA: Diagnosis not present

## 2021-10-29 LAB — BODY FLUID CELL COUNT WITH DIFFERENTIAL
Eos, Fluid: 0 %
Eos, Fluid: 0 %
Lymphs, Fluid: 61 %
Lymphs, Fluid: 63 %
Monocyte-Macrophage-Serous Fluid: 25 % — ABNORMAL LOW (ref 50–90)
Monocyte-Macrophage-Serous Fluid: 29 % — ABNORMAL LOW (ref 50–90)
Neutrophil Count, Fluid: 10 % (ref 0–25)
Neutrophil Count, Fluid: 12 % (ref 0–25)
Total Nucleated Cell Count, Fluid: 435 cu mm (ref 0–1000)
Total Nucleated Cell Count, Fluid: 595 cu mm (ref 0–1000)

## 2021-10-29 LAB — LACTATE DEHYDROGENASE, PLEURAL OR PERITONEAL FLUID
LD, Fluid: 66 U/L — ABNORMAL HIGH (ref 3–23)
LD, Fluid: 95 U/L — ABNORMAL HIGH (ref 3–23)

## 2021-10-29 LAB — PROTEIN, PLEURAL OR PERITONEAL FLUID
Total protein, fluid: <3 g/dL
Total protein, fluid: <3 g/dL

## 2021-10-29 LAB — ECHOCARDIOGRAM COMPLETE
AV Mean grad: 1.5 mmHg
AV Peak grad: 2.5 mmHg
Ao pk vel: 0.79 m/s
Area-P 1/2: 4.49 cm2
Calc EF: 19.1 %
Height: 63 in
S' Lateral: 5.9 cm
Single Plane A2C EF: 9.3 %
Single Plane A4C EF: 25.9 %
Weight: 2056.45 oz

## 2021-10-29 LAB — PROCALCITONIN: Procalcitonin: <0.1 ng/mL

## 2021-10-29 MED ORDER — DONEPEZIL HCL 10 MG PO TABS
10.0000 mg | ORAL_TABLET | Freq: Every day | ORAL | Status: DC
  Administered 2021-10-29 – 2021-11-04 (×7): 10 mg via ORAL
  Filled 2021-10-29 (×7): qty 1

## 2021-10-29 MED ORDER — SODIUM CHLORIDE 0.9 % IV SOLN
2.0000 g | INTRAVENOUS | Status: DC
  Administered 2021-10-30 – 2021-11-02 (×4): 2 g via INTRAVENOUS
  Filled 2021-10-29 (×5): qty 20

## 2021-10-29 MED ORDER — MIRTAZAPINE 15 MG PO TABS
15.0000 mg | ORAL_TABLET | Freq: Every day | ORAL | Status: DC
  Administered 2021-10-29 – 2021-11-04 (×7): 15 mg via ORAL
  Filled 2021-10-29 (×8): qty 1

## 2021-10-29 MED ORDER — PERFLUTREN LIPID MICROSPHERE
1.0000 mL | INTRAVENOUS | Status: AC | PRN
  Administered 2021-10-29: 2 mL via INTRAVENOUS
  Filled 2021-10-29: qty 10

## 2021-10-29 MED ORDER — SODIUM CHLORIDE 0.9 % IV SOLN
500.0000 mg | INTRAVENOUS | Status: DC
  Administered 2021-10-30: 500 mg via INTRAVENOUS
  Filled 2021-10-29 (×2): qty 5

## 2021-10-29 MED ORDER — ALBUMIN HUMAN 25 % IV SOLN
50.0000 g | Freq: Once | INTRAVENOUS | Status: AC
  Administered 2021-10-29: 50 g via INTRAVENOUS
  Filled 2021-10-29: qty 200

## 2021-10-29 MED ORDER — APIXABAN 5 MG PO TABS
5.0000 mg | ORAL_TABLET | Freq: Two times a day (BID) | ORAL | Status: DC
  Administered 2021-10-29 – 2021-10-30 (×4): 5 mg via ORAL
  Filled 2021-10-29 (×4): qty 1

## 2021-10-29 NOTE — ED Notes (Signed)
Dr.Smith came at bedside for thoracentesis procedure. Pt tolerated well. Specimens sent to lab. Pt currently reports feeling better. Will continue to monitor.  ?

## 2021-10-29 NOTE — Progress Notes (Signed)
Pt combative  insisting he wants to go home. Pt redirectable and responded well to go back to bed . Noted episodes of confusion. Will continue to monitor. ?

## 2021-10-29 NOTE — Progress Notes (Signed)
MEDICATION RELATED CONSULT NOTE ? ?Allergies  ?Allergen Reactions  ? Penicillins Shortness Of Breath and Rash  ?  Has patient had a PCN reaction causing immediate rash, facial/tongue/throat swelling, SOB or lightheadedness with hypotension: Yes ?Has patient had a PCN reaction causing severe rash involving mucus membranes or skin necrosis: No ?Has patient had a PCN reaction that required hospitalization: No ?Has patient had a PCN reaction occurring within the last 10 years: Yes ?If all of the above answers are "NO", then may proceed with Cephalosporin use. ?  ? Sulfa Antibiotics Hives, Shortness Of Breath and Rash  ? Adhesive [Tape] Other (See Comments)  ?  When removed, bad bruises result  ? Tramadol Nausea And Vomiting  ? ? ?Patient Measurements: ?Height: '5\' 3"'$  (160 cm) ?Weight: 58.3 kg (128 lb 8.5 oz) ?IBW/kg (Calculated) : 56.9 ? ?Vital Signs: ?Temp: 98.1 ?F (36.7 ?C) (05/02 1047) ?Temp Source: Oral (05/02 1047) ?BP: 105/74 (05/02 1030) ?Pulse Rate: 89 (05/02 1030) ?Intake/Output from previous day: ?No intake/output data recorded. ?Intake/Output from this shift: ?No intake/output data recorded. ? ?Labs: ?Recent Labs  ?  10/28/21 ?0926  ?WBC 8.5  ?HGB 16.0  ?HCT 48.8  ?PLT 308  ?CREATININE 1.61*  ?ALBUMIN 3.4*  ?PROT 7.2  ?AST 24  ?ALT 25  ?ALKPHOS 17*  ?BILITOT 1.1  ? ?Estimated Creatinine Clearance: 36.3 mL/min (A) (by C-G formula based on SCr of 1.61 mg/dL (H)). ? ? ?Microbiology: ?Recent Results (from the past 720 hour(s))  ?Resp Panel by RT-PCR (Flu A&B, Covid) Nasopharyngeal Swab     Status: None  ? Collection Time: 10/28/21 12:05 PM  ? Specimen: Nasopharyngeal Swab; Nasopharyngeal(NP) swabs in vial transport medium  ?Result Value Ref Range Status  ? SARS Coronavirus 2 by RT PCR NEGATIVE NEGATIVE Final  ?  Comment: (NOTE) ?SARS-CoV-2 target nucleic acids are NOT DETECTED. ? ?The SARS-CoV-2 RNA is generally detectable in upper respiratory ?specimens during the acute phase of infection. The  lowest ?concentration of SARS-CoV-2 viral copies this assay can detect is ?138 copies/mL. A negative result does not preclude SARS-Cov-2 ?infection and should not be used as the sole basis for treatment or ?other patient management decisions. A negative result may occur with  ?improper specimen collection/handling, submission of specimen other ?than nasopharyngeal swab, presence of viral mutation(s) within the ?areas targeted by this assay, and inadequate number of viral ?copies(<138 copies/mL). A negative result must be combined with ?clinical observations, patient history, and epidemiological ?information. The expected result is Negative. ? ?Fact Sheet for Patients:  ?EntrepreneurPulse.com.au ? ?Fact Sheet for Healthcare Providers:  ?IncredibleEmployment.be ? ?This test is no t yet approved or cleared by the [place] FDA and  ?has been authorized for detection and/or diagnosis of SARS-CoV-2 by ?FDA under an Emergency Use Authorization (EUA). This EUA will remain  ?in effect (meaning this test can be used) for the duration of the ?COVID-19 declaration under Section 564(b)(1) of the Act, 21 ?U.S.C.section 360bbb-3(b)(1), unless the authorization is terminated  ?or revoked sooner.  ? ? ?  ? Influenza A by PCR NEGATIVE NEGATIVE Final  ? Influenza B by PCR NEGATIVE NEGATIVE Final  ?  Comment: (NOTE) ?The Xpert Xpress SARS-CoV-2/FLU/RSV plus assay is intended as an aid ?in the diagnosis of influenza from Nasopharyngeal swab specimens and ?should not be used as a sole basis for treatment. Nasal washings and ?aspirates are unacceptable for Xpert Xpress SARS-CoV-2/FLU/RSV ?testing. ? ?Fact Sheet for Patients: ?EntrepreneurPulse.com.au ? ?Fact Sheet for Healthcare Providers: ?IncredibleEmployment.be ? ?This test is not  yet approved or cleared by the [place] and ?has been authorized for detection and/or diagnosis of SARS-CoV-2 by ?FDA under  an Emergency Use Authorization (EUA). This EUA will remain ?in effect (meaning this test can be used) for the duration of the ?COVID-19 declaration under Section 564(b)(1) of the Act, 21 U.S.C. ?section 360bbb-3(b)(1), unless the authorization is terminated or ?revoked. ? ?Performed at [facility], [place] [address], [place], [place] ?[address] ?  ? ? ?Medical History: ?Past Medical History:  ?Diagnosis Date  ? Cataract   ? Cerebral aneurysm without rupture   ? Followed by Dr. Melrose Nakayama, on Plavix  ? CHF (congestive heart failure) ([place])   ? Clostridium difficile colitis December 2015  ? Presented with sepsis, required stool transplant  ? Clostridium difficile colitis   ? Coronary artery disease   ? Depression   ? Diverticulosis 11/23/2014  ? GERD (gastroesophageal reflux disease)   ? Hypothyroidism   ? Myocardial infarction [facility]   ? 2009, 2013,2017  ? Stroke [facility])   ? NOV 2015  ? Suicide attempt [facility])   ? ? ?Medications:  ?Anti-infectives (From admission, onward)  ? ? Start     Dose/Rate Route Frequency Ordered Stop  ? 10/30/21 1400  levofloxacin (LEVAQUIN) IVPB 750 mg  Status:  Discontinued       ? 750 mg ?100 mL/hr over 90 Minutes Intravenous Every 48 hours 10/28/21 1628 10/29/21 1051  ? 10/30/21 1400  cefTRIAXone (ROCEPHIN) 2 g in sodium chloride 0.9 % 100 mL IVPB       ? 2 g ?200 mL/hr over 30 Minutes Intravenous Every 24 hours 10/29/21 1051    ? 10/30/21 1400  azithromycin (ZITHROMAX) 500 mg in sodium chloride 0.9 % 250 mL IVPB       ? 500 mg ?250 mL/hr over 60 Minutes Intravenous Every 24 hours 10/29/21 1051    ? 10/28/21 1330  levofloxacin (LEVAQUIN) IVPB 750 mg       ? 750 mg ?100 mL/hr over 90 Minutes Intravenous  Once 10/28/21 1319 10/28/21 1714  ? ?  ? ? ?Plan:  ?D/w provider change from levaquin to ceftriaxone + azithromycin. Patient has documented PCN allergy of rash. Given low risk of cross-sensitivity will trial cehpalsporin and monitor closely.  ? ?Eduard Clos Coralynn Gaona ?10/29/2021,10:52 AM ? ? ? ?
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
## 2021-10-29 NOTE — Procedures (Signed)
Thoracentesis  Procedure Note ? ?Leonides Schanz  ?[id number]  ?11-29-1955 ? ?Date:10/29/21  ?Time:8:33 AM  ? ?Provider Performing:Ash Mcelwain C Tamala Julian  ? ?Procedure: Thoracentesis with imaging guidance (32122) ? ?Indication(s) ?Pleural Effusion ? ?Consent ?Risks of the procedure as well as the alternatives and risks of each were explained to the patient and/or caregiver.  Consent for the procedure was obtained and is signed in the bedside chart ? ?Anesthesia ?Topical only with 1% lidocaine  ? ? ?Time Out ?Verified patient identification, verified procedure, site/side was marked, verified correct patient position, special equipment/implants available, medications/allergies/relevant history reviewed, required imaging and test results available. ? ? ?Sterile Technique ?Maximal sterile technique including full sterile barrier drape, hand hygiene, sterile gown, sterile gloves, mask, hair covering, sterile ultrasound probe cover (if used). ? ?Procedure Description ?Ultrasound was used to identify appropriate pleural anatomy for placement and overlying skin marked.  Area of drainage cleaned and draped in sterile fashion. Lidocaine was used to anesthetize the skin and subcutaneous tissue.  1300 cc's of amber appearing fluid was drained from the left pleural space. Catheter then removed and bandaid applied to site. ? ? ?Complications/Tolerance ?None; patient tolerated the procedure well. ?Chest X-ray is ordered to confirm no post-procedural complication. ? ? ?EBL ?Minimal ? ? ?Specimen(s) ?Pleural fluid ? ? ? ? ? ? ? ? ? ? ?

## 2021-10-29 NOTE — ED Notes (Signed)
Echo at bedside

## 2021-10-29 NOTE — ED Notes (Signed)
Patient resting in bed with eyes closed. Respirations even and unlabored. Call bell in reach. ?

## 2021-10-29 NOTE — Progress Notes (Signed)
?PROGRESS NOTE ? ? ?Donald [name]  TFT:[id number] DOB: 1956-06-20 DOA: 10/28/2021 ?PCP: No primary care provider on file.  ?Brief Narrative:  ?27 white male-previous teaching service patient-currently lives with nephew ?Underlying COPD discontinued tobacco 30 years prior ?HFrEF 40% apical aneurysm CABG SVG remote past - LV thrombus + ISC CM, PAF + ILR ?Prior CVA Prior brain aneurysm?  Vascular dementia on Plavix ?C. difficile colitis 2015 ?Hypothyroid ?CKD 3B ?Depression anxiety +?  Cognitive deficits 2/2 above-prior intentional OD nitroglycerin ? ?Subacute SOB since?  December 2022 not SOB with ambulation-developed more acute symptoms probably about 2 weeks ago mid May cold cough symptoms ?Unintentional weight loss 160-->129 ? ?ED work-up Tmax 96 degrees, RR 30, no tachycardia-Labs unremarkable 6 CXR?  Infiltrate-CT chest = pleural effusion?  Multifocal PNA with marked dilatation LV ? ?PSI class IV,  curb 65 = 2 Rx Levaquin, saline 75 albuterol Mucinex lactic acid obtained, blood cultures obtained is DNR ? ?Hospital-Problem based course ? ?Probable hypoxia secondary to combined ?1] acute superimposed on chronic HFrEF + pleural effusion ?Appreciate greatly critical care during thoracentesis bilaterally ?Follow thoracentesis labs-suspect this is will be exudative--3 L total pulled off therefore will give albumin 50 mg currently today and reassess hemodynamics in a.m. ?Repeat CXR a.m. ?Continue imdur 30--continue Coreg 6.25 twice daily, aspirin 81 ? lisinopril he is noncompliant with ?Aldactone 12.5/lasix 20 on hold for now--? Resume in am ?2] sepsis +pneumonia--  ?lactic acidosis cycling down to 1.5 procalcitonin less than 0.10 ?Continue Levaquin as he has penicillin allergy ?Underlying COPD ?Quit smoking several years ago-no overt COPD issues at this time ?Prior CVA with brain aneurysm and vascular dementia-on Plavix Aricept ?Depression anxiety prior OD in the past ?Currently lives with nephew.  Ensure safe  discharge plan in place ?Unclear indication for gabapentin-outpatient consideration to de-escalate ?Continue trazodone 50 at bedtime sleep, continue Remeron 15 at bedtime, add back sertraline 25 daily ?Resume aricept ?PAF CHA2DS2-VASc 2 > 4 ?Does not seem to have been on rate control in the outpatient-no other procedures done-resume Eliquis but chest x-ray in a.m. to ensure lungs clear ?Hypothyroid ??  History of but not on meds?-Outpatient TSH ?Mild metabolic acidosis secondary to sepsis + CKD 3B ?Cycle labs in a.m.-holding ACE as above as well as diuretics ? ? ?DVT prophylaxis: Eliquis now ?Code Status: DNR ?Family Communication: Called to communicate with Mr. Vicente Masson but did not reach him on the phone ?Disposition:  ?Status is: Inpatient ?Remains inpatient appropriate because: tele ?  ?Consultants:  ?CCM ? ? ?Procedures:  ? ?1400 cc thoracentesis right pleural space ?1300 cc thoracentesis left pleural space ? ?Antimicrobials:  ? ?Levaquin ? ? ?Subjective: ?Awake coherent pleasant no distress slightly confused-I do not think he can give consent at this time for any type of procedure ?Dr. Tamala Julian on phone with family to ensure they understand why redoing the procedure ? ? ?Objective: ?Vitals:  ? 10/28/21 2300 10/29/21 0300 10/29/21 0330 10/29/21 0400  ?BP: 115/85 129/82 (!) 125/91 121/81  ?Pulse: 99 98 (!) 102 (!) 103  ?Resp: (!) 33 (!) 30 (!) 22 (!) 23  ?Temp:      ?TempSrc:      ?SpO2: 97% 97% 97% 96%  ?Weight:      ?Height:      ? ?No intake or output data in the 24 hours ending 10/29/21 0721 ?Filed Weights  ? 10/28/21 0908  ?Weight: 58.3 kg  ? ? ?Examination: ? ?Awake coherent very cachectic white male-slightly incoherent ?Eomi ncat no icterus  no pallor ?Dullness posterolaterally ?Abdomen soft no rebound no guarding ?No lower extremity edema ?ROM intact no focal deficit ? ?Data Reviewed: personally reviewed  ? ?CBC ?   ?Component Value Date/Time  ? WBC 8.5 10/28/2021 0926  ? RBC 5.75 10/28/2021 0926  ? HGB  16.0 10/28/2021 0926  ? HGB 11.2 (L) 07/23/2017 1015  ? HCT 48.8 10/28/2021 0926  ? HCT 33.8 (L) 07/23/2017 1015  ? PLT 308 10/28/2021 0926  ? PLT 265 07/23/2017 1015  ? MCV 84.9 10/28/2021 0926  ? MCV 87 07/23/2017 1015  ? MCV 86 07/01/2014 0522  ? MCH 27.8 10/28/2021 0926  ? MCHC 32.8 10/28/2021 0926  ? RDW 13.6 10/28/2021 0926  ? RDW 13.7 07/23/2017 1015  ? RDW 14.9 (H) 07/01/2014 0522  ? LYMPHSABS 1.9 10/28/2021 0926  ? LYMPHSABS 1.1 07/01/2014 0522  ? MONOABS 0.7 10/28/2021 0926  ? MONOABS 0.7 07/01/2014 0522  ? EOSABS 0.1 10/28/2021 0926  ? EOSABS 0.2 07/01/2014 0522  ? BASOSABS 0.1 10/28/2021 0926  ? BASOSABS 0.0 07/01/2014 0522  ? ? ?  Latest Ref Rng & Units 10/28/2021  ?  9:26 AM 04/15/2021  ?  1:49 AM 04/14/2021  ?  2:30 AM  ?CMP  ?Glucose 70 - 99 mg/dL 146   141   95    ?BUN 8 - 23 mg/dL 18   35   27    ?Creatinine 0.61 - 1.24 mg/dL 1.61   1.87   1.54    ?Sodium 135 - 145 mmol/L 138   136   137    ?Potassium 3.5 - 5.1 mmol/L 3.6   4.3   4.3    ?Chloride 98 - 111 mmol/L 104   106   105    ?CO2 22 - 32 mmol/L '21   22   21    '$ ?Calcium 8.9 - 10.3 mg/dL 9.5   8.5   9.0    ?Total Protein 6.5 - 8.1 g/dL 7.2      ?Total Bilirubin 0.3 - 1.2 mg/dL 1.1      ?Alkaline Phos 38 - 126 U/L 17      ?AST 15 - 41 U/L 24      ?ALT 0 - 44 U/L 25      ? ? ? ?Radiology Studies: ?DG Chest 2 View ? ?Result Date: 10/28/2021 ?CLINICAL DATA:  Chest pain. EXAM: CHEST - 2 VIEW COMPARISON:  Chest x-ray April 12, 2021. FINDINGS: Small to moderate bilateral layering pleural effusions. Overlying bibasilar opacities. No visible pneumothorax. Cardiomediastinal silhouette is within normal limits. CABG and median sternotomy. Loop recorder projects over left chest. IMPRESSION: Small to moderate bilateral layering pleural effusions. Overlying ill-defined bibasilar opacities could represent atelectasis and/or consolidation. Chest x-ray Electronically Signed   By: Margaretha Sheffield M.D.   On: 10/28/2021 09:49  ? ?CT Angio Chest PE W and/or Wo  Contrast ? ?Result Date: 10/28/2021 ?CLINICAL DATA:  Chest pain, shortness of breath EXAM: CT ANGIOGRAPHY CHEST WITH CONTRAST TECHNIQUE: Multidetector CT imaging of the chest was performed using the standard protocol during bolus administration of intravenous contrast. Multiplanar CT image reconstructions and MIPs were obtained to evaluate the vascular anatomy. RADIATION DOSE REDUCTION: This exam was performed according to the departmental dose-optimization program which includes automated exposure control, adjustment of the mA and/or kV according to patient size and/or use of iterative reconstruction technique. CONTRAST:  29m OMNIPAQUE IOHEXOL 350 MG/ML SOLN COMPARISON:  Previous studies including the chest radiograph done earlier today FINDINGS: Cardiovascular: Contrast  density in thoracic aorta is less than adequate to evaluate the lumen. Coronary artery calcifications are seen. There is dilation of left ventricular cavity. There are no intraluminal filling defects in the central pulmonary artery branches. Evaluation of small peripheral branches is limited by infiltrates. In image 60 of series 6 there is possible tiny eccentric filling defect in the pulmonary artery branch in the posterior left lower lung fields. In image 59, there is possible eccentric filling defect in the small branch in the posterior right lower lobe. Mediastinum/Nodes: No significant lymphadenopathy seen. There is evidence of previous coronary bypass surgery. Lungs/Pleura: Moderate to large bilateral pleural effusions are seen more so on the right side. Small patchy infiltrates are seen in the left upper lobe. There are moderate sized infiltrates in both lower lobes. Small patchy subpleural infiltrate is seen in the anterior right mid lung fields. Small patchy infiltrate is seen in the anteromedial aspect of right upper lobe. Upper Abdomen: Unremarkable. Musculoskeletal: Unremarkable. Review of the MIP images confirms the above findings.  IMPRESSION: There is no evidence of central pulmonary artery embolism. There are possible small eccentric defects in the few small pulmonary artery branches in both lower lung fields. This finding may suggest residual c

## 2021-10-29 NOTE — Procedures (Signed)
Thoracentesis  Procedure Note ? ?Leonides Schanz  ?[id number]  ?Mar 15, 1956 ? ?Date:10/29/21  ?Time:8:33 AM  ? ?Provider Performing:Marri Mcneff C Tamala Julian  ? ?Procedure: Thoracentesis with imaging guidance (22025) ? ?Indication(s) ?Pleural Effusion ? ?Consent ?Risks of the procedure as well as the alternatives and risks of each were explained to the patient and/or caregiver.  Consent for the procedure was obtained and is signed in the bedside chart ? ?Anesthesia ?Topical only with 1% lidocaine  ? ? ?Time Out ?Verified patient identification, verified procedure, site/side was marked, verified correct patient position, special equipment/implants available, medications/allergies/relevant history reviewed, required imaging and test results available. ? ? ?Sterile Technique ?Maximal sterile technique including full sterile barrier drape, hand hygiene, sterile gown, sterile gloves, mask, hair covering, sterile ultrasound probe cover (if used). ? ?Procedure Description ?Ultrasound was used to identify appropriate pleural anatomy for placement and overlying skin marked.  Area of drainage cleaned and draped in sterile fashion. Lidocaine was used to anesthetize the skin and subcutaneous tissue.  1400 cc's of straw appearing fluid was drained from the right pleural space. Catheter then removed and bandaid applied to site. ? ? ?Complications/Tolerance ?None; patient tolerated the procedure well. ?Chest X-ray is ordered to confirm no post-procedural complication. ? ? ?EBL ?Minimal ? ? ?Specimen(s) ?Pleural fluid ? ? ? ? ? ? ? ? ? ? ?

## 2021-10-30 ENCOUNTER — Inpatient Hospital Stay (HOSPITAL_COMMUNITY): Payer: Medicare HMO

## 2021-10-30 DIAGNOSIS — A419 Sepsis, unspecified organism: Secondary | ICD-10-CM | POA: Diagnosis not present

## 2021-10-30 DIAGNOSIS — N1831 Chronic kidney disease, stage 3a: Secondary | ICD-10-CM | POA: Diagnosis not present

## 2021-10-30 DIAGNOSIS — I5042 Chronic combined systolic (congestive) and diastolic (congestive) heart failure: Secondary | ICD-10-CM

## 2021-10-30 DIAGNOSIS — I482 Chronic atrial fibrillation, unspecified: Secondary | ICD-10-CM

## 2021-10-30 DIAGNOSIS — I2510 Atherosclerotic heart disease of native coronary artery without angina pectoris: Secondary | ICD-10-CM

## 2021-10-30 DIAGNOSIS — J189 Pneumonia, unspecified organism: Secondary | ICD-10-CM | POA: Diagnosis not present

## 2021-10-30 DIAGNOSIS — E440 Moderate protein-calorie malnutrition: Secondary | ICD-10-CM | POA: Insufficient documentation

## 2021-10-30 LAB — BLOOD GAS, ARTERIAL
Acid-base deficit: 4.4 mmol/L — ABNORMAL HIGH (ref 0.0–2.0)
Bicarbonate: 17.5 mmol/L — ABNORMAL LOW (ref 20.0–28.0)
Drawn by: 27027
O2 Saturation: 99.5 %
Patient temperature: 36.4
pCO2 arterial: 23 mmHg — ABNORMAL LOW (ref 32–48)
pH, Arterial: 7.48 — ABNORMAL HIGH (ref 7.35–7.45)
pO2, Arterial: 95 mmHg (ref 83–108)

## 2021-10-30 LAB — CBC
HCT: 40.5 % (ref 39.0–52.0)
Hemoglobin: 13.3 g/dL (ref 13.0–17.0)
MCH: 27.4 pg (ref 26.0–34.0)
MCHC: 32.8 g/dL (ref 30.0–36.0)
MCV: 83.3 fL (ref 80.0–100.0)
Platelets: 237 10*3/uL (ref 150–400)
RBC: 4.86 MIL/uL (ref 4.22–5.81)
RDW: 13.6 % (ref 11.5–15.5)
WBC: 12 10*3/uL — ABNORMAL HIGH (ref 4.0–10.5)
nRBC: 0 % (ref 0.0–0.2)

## 2021-10-30 LAB — CYTOLOGY - NON PAP

## 2021-10-30 LAB — COMPREHENSIVE METABOLIC PANEL
ALT: 30 U/L (ref 0–44)
AST: 23 U/L (ref 15–41)
Albumin: 3.4 g/dL — ABNORMAL LOW (ref 3.5–5.0)
Alkaline Phosphatase: 12 U/L — ABNORMAL LOW (ref 38–126)
Anion gap: 10 (ref 5–15)
BUN: 19 mg/dL (ref 8–23)
CO2: 19 mmol/L — ABNORMAL LOW (ref 22–32)
Calcium: 9 mg/dL (ref 8.9–10.3)
Chloride: 108 mmol/L (ref 98–111)
Creatinine, Ser: 1.5 mg/dL — ABNORMAL HIGH (ref 0.61–1.24)
GFR, Estimated: 51 mL/min — ABNORMAL LOW (ref >60)
Glucose, Bld: 107 mg/dL — ABNORMAL HIGH (ref 70–99)
Potassium: 4.1 mmol/L (ref 3.5–5.1)
Sodium: 137 mmol/L (ref 135–145)
Total Bilirubin: 1.5 mg/dL — ABNORMAL HIGH (ref 0.3–1.2)
Total Protein: 6 g/dL — ABNORMAL LOW (ref 6.5–8.1)

## 2021-10-30 LAB — FOLATE: Folate: 5.5 ng/mL — ABNORMAL LOW (ref >5.9)

## 2021-10-30 LAB — LEGIONELLA PNEUMOPHILA SEROGP 1 UR AG: L. pneumophila Serogp 1 Ur Ag: NEGATIVE

## 2021-10-30 LAB — PROCALCITONIN: Procalcitonin: <0.1 ng/mL

## 2021-10-30 LAB — LACTATE DEHYDROGENASE: LDH: 237 U/L — ABNORMAL HIGH (ref 98–192)

## 2021-10-30 LAB — MAGNESIUM: Magnesium: 1.7 mg/dL (ref 1.7–2.4)

## 2021-10-30 LAB — VITAMIN B12: Vitamin B-12: 751 pg/mL (ref 180–914)

## 2021-10-30 MED ORDER — FOOD THICKENER (SIMPLYTHICK)
1.0000 | ORAL | Status: DC | PRN
  Filled 2021-10-30 (×6): qty 1

## 2021-10-30 MED ORDER — AZITHROMYCIN 250 MG PO TABS
500.0000 mg | ORAL_TABLET | Freq: Every day | ORAL | Status: AC
  Administered 2021-10-31 – 2021-11-03 (×4): 500 mg via ORAL
  Filled 2021-10-30 (×4): qty 2

## 2021-10-30 MED ORDER — ADULT MULTIVITAMIN W/MINERALS CH
1.0000 | ORAL_TABLET | Freq: Every day | ORAL | Status: DC
  Administered 2021-10-30 – 2021-11-05 (×7): 1 via ORAL
  Filled 2021-10-30 (×7): qty 1

## 2021-10-30 MED ORDER — NEPRO/CARBSTEADY PO LIQD
237.0000 mL | Freq: Two times a day (BID) | ORAL | Status: DC
Start: 2021-10-31 — End: 2021-11-05
  Administered 2021-11-02 (×2): 237 mL via ORAL

## 2021-10-30 MED ORDER — SODIUM CHLORIDE 0.9 % IV BOLUS
250.0000 mL | Freq: Once | INTRAVENOUS | Status: AC | PRN
  Administered 2021-10-30: 250 mL via INTRAVENOUS

## 2021-10-30 MED ORDER — SODIUM CHLORIDE 0.9 % IV BOLUS: 500.0000 mL | Freq: Once | INTRAVENOUS | Status: DC | PRN

## 2021-10-30 NOTE — Evaluation (Signed)
Occupational Therapy Evaluation ?Patient Details ?Name: Donald [name] ?MRN: [id number] ?DOB: 04-03-1956 ?Today's Date: 10/30/2021 ? ? ?History of Present Illness Patient is a 66 y/o male who presents on 5/1 with SOB and chest pain as well as URI symptoms. Found to have sepsis secondary to PNA and bil large pleural effusions s/p thoracentesis 5/2. PMH includes dementia, depression, anxiety, CVA, CAD, CHF, MI, suicide attempt.  ? ?Clinical Impression ?  ?Pt. Has was not oriented to date or place. Pt. Was oriented to self. Pt. Has a hx of dementia so unsure what his baseline is. Pt. Was able to follow 1 step directions with multiple cues. Pt. Has decreased ability to care for self and with mobility. Pt. Will need assist at home or may need st snf for rehab depending on progress with adls and mobility and support available at home.  ?   ? ?Recommendations for follow up therapy are one component of a multi-disciplinary discharge planning process, led by the attending physician.  Recommendations may be updated based on patient status, additional functional criteria and insurance authorization.  ? ?Follow Up Recommendations ? Home health OT (if pt family are able to provide assist.)  ?  ?Assistance Recommended at Discharge Frequent or constant Supervision/Assistance  ?Patient can return home with the following A little help with walking and/or transfers;A little help with bathing/dressing/bathroom;Assistance with cooking/housework ? ?  ?Functional Status Assessment ? Patient has had a recent decline in their functional status and demonstrates the ability to make significant improvements in function in a reasonable and predictable amount of time.  ?Equipment Recommendations ? None recommended by OT  ?  ?Recommendations for Other Services   ? ? ?  ?Precautions / Restrictions Precautions ?Precautions: Fall ?Restrictions ?Weight Bearing Restrictions: No  ? ?  ? ?Mobility Bed Mobility ?Overal bed mobility: Needs Assistance ?Bed  Mobility: Supine to Sit, Sit to Supine ?  ?  ?Supine to sit: Min assist ?Sit to supine: Min assist ?  ?  ?  ? ?Transfers ?Overall transfer level: Needs assistance ?Equipment used: Rolling walker (2 wheels) ?Transfers: Sit to/from Stand ?Sit to Stand: Min assist ?  ?  ?  ?  ?  ?General transfer comment: cues for proper hand placement.l ?  ? ?  ?Balance Overall balance assessment: Needs assistance ?  ?Sitting balance-Leahy Scale: Fair ?  ?  ?  ?Standing balance-Leahy Scale: Poor ?  ?  ?  ?  ?  ?  ?  ?  ?  ?  ?  ?  ?   ? ?ADL either performed or assessed with clinical judgement  ? ?ADL Overall ADL's : Needs assistance/impaired ?Eating/Feeding: Independent ?  ?Grooming: Dance movement psychotherapist;Wash/dry hands;Min guard;Standing ?  ?Upper Body Bathing: Sitting;Set up;Supervision/ safety ?  ?Lower Body Bathing: Moderate assistance;Sit to/from stand ?  ?Upper Body Dressing : Minimal assistance;Sitting ?  ?Lower Body Dressing: Moderate assistance;Sit to/from stand ?  ?Toilet Transfer: Minimal assistance;Grab bars;Ambulation;Rolling walker (2 wheels) ?  ?Toileting- Clothing Manipulation and Hygiene: Minimal assistance;Sit to/from stand ?  ?  ?  ?Functional mobility during ADLs: Minimal assistance (assist to steer walker.) ?General ADL Comments: difficulty with donning and doffing l sock.  ? ? ? ?Vision Baseline Vision/History: 1 Wears glasses ?Ability to See in Adequate Light: 0 Adequate ?Patient Visual Report: No change from baseline ?Vision Assessment?: No apparent visual deficits  ?   ?Perception   ?  ?Praxis   ?  ? ?Pertinent Vitals/Pain Pain Assessment ?Pain Assessment: No/denies pain  ? ? ? ?  Hand Dominance Left ?  ?Extremity/Trunk Assessment Upper Extremity Assessment ?Upper Extremity Assessment: Defer to OT evaluation ?  ?Lower Extremity Assessment ?Lower Extremity Assessment: RLE deficits/detail;LLE deficits/detail ?RLE Sensation: decreased light touch ?LLE Sensation: decreased light touch ?  ?  ?  ?Communication  Communication ?Communication: No difficulties ?  ?Cognition Arousal/Alertness: Awake/alert (Simultaneous filing. User may not have seen previous data.) ?Behavior During Therapy: [facility] for tasks assessed/performed (Simultaneous filing. User may not have seen previous data.) ?Overall Cognitive Status: History of cognitive impairments - at baseline (Simultaneous filing. User may not have seen previous data.) ?  ?  ?  ?  ?  ?  ?  ?  ?  ?  ?  ?  ?  ?  ?  ?  ?General Comments: unsure how different pt. cognition is from baseline. (Simultaneous filing. User may not have seen previous data.) ?  ?  ?General Comments  VSS on RA throughout. Sister present during session. ? ?  ?Exercises   ?  ?Shoulder Instructions    ? ? ?Home Living Family/patient expects to be discharged to:: Private residence ?Living Arrangements: Other relatives ?Available Help at Discharge: Family;Available PRN/intermittently ?Type of Home: House ?Home Access: Stairs to enter ?Entrance Stairs-Number of Steps: "a few" ?Entrance Stairs-Rails: Right ?Home Layout: One level ?  ?  ?Bathroom Shower/Tub: Walk-in shower ?  ?Bathroom Toilet: Standard ?  ?  ?Home Equipment: BSC/3in1;Rolling Walker (2 wheels);Cane - single point ?  ?  ?  ? ?  ?Prior Functioning/Environment Prior Level of Function : Independent/Modified Independent ?  ?  ?  ?  ?  ?  ?Mobility Comments: Uses SPC for ambulation, walks 2 miles per day. Does not drive. Does cooking. ?ADLs Comments: Independent ?  ? ?  ?  ?OT Problem List: Decreased activity tolerance;Decreased safety awareness;Decreased knowledge of use of DME or AE ?  ?   ?OT Treatment/Interventions: Self-care/ADL training;Therapeutic activities;Patient/family education;Energy conservation  ?  ?OT Goals(Current goals can be found in the care plan section) Acute Rehab OT Goals ?Patient Stated Goal: to get stronger ?OT Goal Formulation: With patient ?Time For Goal Achievement: 11/13/21 ?Potential to Achieve Goals: Good ?ADL Goals ?Pt Will  Perform Grooming: with supervision;standing ?Pt Will Perform Upper Body Bathing: with supervision;sitting ?Pt Will Perform Lower Body Bathing: sit to/from stand ?Pt Will Perform Upper Body Dressing: with supervision;sitting ?Pt Will Perform Lower Body Dressing: with supervision;sit to/from stand ?Pt Will Transfer to Toilet: with supervision;ambulating ?Pt Will Perform Toileting - Clothing Manipulation and hygiene: with supervision;sit to/from stand  ?OT Frequency: Min 2X/week ?  ? ?Co-evaluation   ?  ?  ?  ?  ? ?  ?AM-PAC OT "6 Clicks" Daily Activity     ?Outcome Measure Help from another person eating meals?: None ?Help from another person taking care of personal grooming?: A Little ?Help from another person toileting, which includes using toliet, bedpan, or urinal?: A Lot ?Help from another person bathing (including washing, rinsing, drying)?: A Lot ?Help from another person to put on and taking off regular upper body clothing?: A Little ?Help from another person to put on and taking off regular lower body clothing?: A Lot ?6 Click Score: 16 ?  ?End of Session Equipment Utilized During Treatment: Rolling walker (2 wheels) ?Nurse Communication:  (ok therapy) ? ?Activity Tolerance: Patient limited by fatigue ?Patient left: in bed;with call bell/phone within reach;with bed alarm set ? ?OT Visit Diagnosis: Unsteadiness on feet (R26.81)  ?              ?  Time: 8727-6184 ?OT Time Calculation (min): 41 min ?Charges:  OT General Charges ?$OT Visit: 1 Visit ?OT Evaluation ?$OT Eval Moderate Complexity: 1 Mod ?OT Treatments ?$Self Care/Home Management : 8-22 mins ? ?Reece Packer OT/L ? ? ?[place] ?10/30/2021, 1:01 PM ?

## 2021-10-30 NOTE — Progress Notes (Signed)
Noted SBP in the 70's to low 80's , RR = 18-low 30's , O2 saturation at 95-98 % on RA. Bilateral coarse crackles on both lungs on auscultation not present this AM on initial assesment. Oncoming night RN  and Rapid response , Charge RN in the room to assist. Pt has stated  " I might not be able to make it tomorrow "!  ?
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
## 2021-10-30 NOTE — Progress Notes (Signed)
?  Mobility Specialist Criteria Algorithm Info. ? ? 10/30/21 1315  ?Mobility  ?Activity Ambulated with assistance in room  ?Range of Motion/Exercises Active;All extremities  ?Level of Assistance Minimal assist, patient does 75% or more  ?Assistive Device Front wheel walker  ?Distance Ambulated (ft) 15 ft  ?Activity Response Tolerated fair  ? ?Patient received in bed slightly agitated. Agreed to participate w/encouragement and reorientation of place and time. Ambulated short distance in room min A with slow. Required max cues for hand placement and sequencing. Returned to bed without incident. Was left with all needs met, call bell in reach and bed alarm set. ? ?10/30/2021 ?1:15 PM ? ?Donald [name], CMS, BS EXP ?Acute Rehabilitation Services  ?IOMBT:[phone] ?Office: [phone] ? ?

## 2021-10-30 NOTE — Hospital Course (Addendum)
66 year old male, lives with his nephew and independent, medical history significant for CAD s/p CABG x3, chronic systolic CHF with apical aneurysm, HLD, CVA, CKD stage, brain aneurysm, paroxysmal A-fib s/p ILR, remote history of smoking, GERD, anxiety and depression, presented to ED with unintentional weight loss and failure to thrive over several months, progressive dyspnea on exertion, orthopnea and dry cough, progressive worsening memory loss and confusion and chronic dysphagia.  In ED, Tmax of 96 degrees, respiratory rate of 30 and CT chest showed bilateral large pleural effusion and possible multifocal pneumonia.  Admitted for sepsis due to suspected pneumonia, large bilateral pleural effusions.  PCCM consulted and s/p bilateral thoracentesis, fluid transudative, culture and cytology pending.  Worsening cardiomyopathy, cardiology consulted.  Overnight 5/3 with worsening dyspnea, suspecting acute pulmonary edema, started IV Lasix and placed on BiPAP.  Clinically much improved, developed hypotension and hypoxia after 1 mg of Versed and 25 mcg of fentanyl in the Cath Lab and hence cath canceled, plans for outpatient ischemic evaluation.  Medically optimized for DC, awaiting SNF and insurance authorization for same. ?

## 2021-10-30 NOTE — Significant Event (Addendum)
Rapid Response Event Note  ? ?Reason for Call : Tachypnea, SOB ?Initial Focused Assessment:  ?I was called by the bedside staff to come assess [name] for c/o SOB. Upon arrival, Donald [name] was alert, in semi-Fowlers position with a NRB mask at 10L. He is oriented to person, place and time. He denies CP and SOB at this time. He stated "I feel good." He was previously on 2L [place]. His sats were 100% so while at the bedside I weaned him back to 2L [place]. He is tachypneic with RR 40s with intermittent periods of mid 20s. Very mild increased WOB at rest. BBS coarse rhonchi with rales in the bases. Increased WOB with any activity however he returns to his rested state within approximately 5 mins. No reported productive cough per patient/staff. No JVD. Dry oral mucosa, poor skin turgor and concentrated tea colored urine. Skin is warm, dry and pink. No need for additional support (NIPPV) at this time. Planned LHC tomorrow.  ?2000-97.32F, HR 87 SR with PVCs, 73/53(61), RR 41 with sats 99% on 2L [place].  ? ?Will give NS 250cc fluid bolus, may need more volume as he is clinically dry.  ? ?ABG-7.48/23/95/17.5 ? ?Interventions:  ?-PCXR, ABG per Dr. Cyd Silence ?-NS 250 cc bolus ? ? ?Plan of Care:  ?-Notify MD for further orders ?-Notify MD and/or RRRN for any further assistance. ? ? ? ?MD Notified: D.r Cyd Silence per primary RN ?Call Time: 1935 ?Arrival Time: 1945 ?End Time: 2036 ? ?Madelynn Done, RN ?

## 2021-10-30 NOTE — Progress Notes (Signed)
PIV consult: Needs second site for cardiac procedure in AM. Pt requested we wait until the morning to place second site so he "can get some rest." ?

## 2021-10-30 NOTE — Progress Notes (Signed)
?PROGRESS NOTE ?  ?Donald [name]  QXI:[id number]    DOB: [birth year]    DOA: 10/28/2021 ? ?PCP: No primary care provider on file.  ? ?I have briefly reviewed patients previous medical records in [facility]. ? ?Chief Complaint  ?Patient presents with  ? Shortness of Breath  ? Chest Pain  ? ? ?Brief Narrative:  ? 66 year old male, lives with his nephew and independent, medical history significant for CAD s/p CABG x3, chronic systolic CHF with apical aneurysm, HLD, CVA, CKD stage, brain aneurysm, paroxysmal A-fib s/p ILR, remote history of smoking, GERD, anxiety and depression, presented to ED with unintentional weight loss and failure to thrive over several months, progressive dyspnea on exertion, orthopnea and dry cough, progressive worsening memory loss and confusion and chronic dysphagia.  In ED, Tmax of 96 degrees, respiratory rate of 30 and CT chest showed bilateral large pleural effusion and possible multifocal pneumonia.  Admitted for sepsis due to suspected pneumonia, large bilateral pleural effusions.  PCCM consulted and s/p bilateral thoracentesis, fluid transudative, culture and cytology pending.  Worsening cardiomyopathy, cardiology consulted. ? ? ?Assessment & Plan:  ?Principal Problem: ?  Sepsis due to pneumonia [facility]) ?Active Problems: ?  Coronary artery disease ?  History of stroke ?  Chronic combined systolic (congestive) and diastolic (congestive) heart failure (HCC) ?  Chronic a-fib ([place]) ?  Vascular dementia with behavior disturbance ([place]) ?  Hyperlipidemia ?  Hypothyroidism ?  Chronic kidney disease, stage 3a ([place]) ?  DNR (do not resuscitate) ?  Malnutrition of moderate degree ? ? ?Sepsis due to suspected pneumonia: Met SIRS criteria on admission including hypothermia and tachypnea.  CTA chest showed multifocal pneumonia and bilateral moderate to large pleural effusions.  COVID-19 and flu RT-PCR negative.  Urinary pneumococcal and Legionella antigen negative.  Blood cultures negative to date.   Lactate was up to 2.9.  Procalcitonin negative.  QuantiFERON-TB gold results pending.  Treated with IV fluids.  Remains on empiric IV ceftriaxone and oral azithromycin.  Sepsis physiology resolved. ?Bilateral moderate to large pleural effusions: PCCM was consulted and underwent bilateral thoracentesis on 5/2 (1.4 L on right and 1.3 L on left).  Fluid transudative in nature.  Cultures negative to date.  Cytology from bilateral pleural fluid: Reactive mesothelial cells.  Improved.  May be related to worsening cardiomyopathy and possible CHF. ?Chronic systolic CHF: Prior LVEF 03-49%.  Echo this admission showed further reduced EF to 25-30% with wall motion abnormalities.  Cardiology consultation appreciated and are considering further ischemic work-up including cardiac cath possibly for 5/4 afternoon.  Last dose of Eliquis today at noon.  Continue aspirin, statins, Coreg and Imdur. ?Paroxysmal A-fib s/p ILR: Currently in sinus rhythm.  Eliquis held for upcoming cardiac cath.  Continue beta-blockers. ?CAD s/p CABG: History of intermittent chest pains and dyspnea for few months.  Continue aspirin, statins, beta-blocker and Imdur.  Cardiology plans cardiac cath 5/4. ?Hyperlipidemia: Continue statins ?Stage IIIa CKD: Labile serum creatinine but appears to be close to his baseline. ?Dysphagia: As per SLP, dysphagia 2 solids and nectar thickened liquids. ??  COPD: No clinical exacerbation.  Remote smoking history. ?Prior CVA with brain aneurysm: On Plavix ?Suspected vascular dementia: On Aricept. ?Depression & Anxiety: Continue current meds. ?Hypothyroid history: Not on meds.  TSH 0.960. ? ?Body mass index is 22.77 kg/m?. ? ?Nutritional Status ?Nutrition Problem: Moderate Malnutrition ?Etiology: chronic illness (cerebral aneurysm, CAD s/p CABG, CHF, CVA) ?Signs/Symptoms: moderate fat depletion, moderate muscle depletion ?Interventions: MVI, Nepro shake, Refer to RD  note for recommendations ? ?Pressure Ulcer: ?  ? ? ?DVT  prophylaxis:   Patient on Eliquis, currently held for cardiac cath ?  Code Status: DNR:  ?Family Communication: Sister at bedside ?Disposition:  ?Status is: Inpatient ?Remains inpatient appropriate because: Further evaluation including cardiac cath.  Remains on IV antibiotics. ?  ? ? ?Consultants:   ?PCCM ?Cardiology ? ?Procedures:   ?Bilateral diagnostic and therapeutic thoracentesis ? ?Antimicrobials:   ?As noted above ? ? ?Subjective:  ?Seen this morning.  Sister at bedside.  Reports ongoing dyspnea on exertion.  Poor historian.  No other complaints reported.  Sister indicated that his mental status has worsened in the last 3 weeks ? ?Objective:  ? ?Vitals:  ? 10/29/21 2003 10/30/21 0035 10/30/21 0509 10/30/21 1433  ?BP: 108/62 110/66 (!) 111/99 102/65  ?Pulse: 92 83 (!) 104 70  ?Resp: 18 20 15  (!) 22  ?Temp: (!) 97 ?F (36.1 ?C) (!) 97 ?F (36.1 ?C) 97.9 ?F (36.6 ?C) (!) 97.1 ?F (36.2 ?C)  ?TempSrc: Axillary Axillary Oral Axillary  ?SpO2: 96% 95% 90% 99%  ?Weight:      ?Height:      ? ? ?General exam: Middle-age male, looks older than stated age, moderately built, frail and cachectic, bitemporal wasting, lying propped up in bed. ?Respiratory system: Harsh breath sounds bilaterally, mostly posteriorly with occasional basal crackles.  No wheezing or rhonchi.  Gets dyspneic even on talking at times. ?Cardiovascular system: S1 & S2 heard, RRR. No JVD, murmurs, rubs, gallops or clicks. No pedal edema. ?Gastrointestinal system: Abdomen is nondistended, soft and nontender. No organomegaly or masses felt. Normal bowel sounds heard. ?Central nervous system: Alert and oriented. No focal neurological deficits. ?Extremities: Symmetric 5 x 5 power. ?Skin: No rashes, lesions or ulcers ?Psychiatry: Judgement and insight appear normal. Mood & affect flat. ? ? ? ?Data Reviewed:   ?I have personally reviewed following labs and imaging studies ? ? ?CBC: ?Recent Labs  ?Lab 10/28/21 ?0926 10/30/21 ?0258  ?WBC 8.5 12.0*  ?NEUTROABS 5.8   --   ?HGB 16.0 13.3  ?HCT 48.8 40.5  ?MCV 84.9 83.3  ?PLT 308 237  ? ? ?Basic Metabolic Panel: ?Recent Labs  ?Lab 10/28/21 ?0926 10/30/21 ?0258  ?NA 138 137  ?K 3.6 4.1  ?CL 104 108  ?CO2 21* 19*  ?GLUCOSE 146* 107*  ?BUN 18 19  ?CREATININE 1.61* 1.50*  ?CALCIUM 9.5 9.0  ?MG  --  1.7  ? ? ?Liver Function Tests: ?Recent Labs  ?Lab 10/28/21 ?0926 10/30/21 ?0258  ?AST 24 23  ?ALT 25 30  ?ALKPHOS 17* 12*  ?BILITOT 1.1 1.5*  ?PROT 7.2 6.0*  ?ALBUMIN 3.4* 3.4*  ? ? ?CBG: ?No results for input(s): GLUCAP in the last 168 hours. ? ?Microbiology Studies:  ? ?Recent Results (from the past 240 hour(s))  ?Resp Panel by RT-PCR (Flu A&B, Covid) Nasopharyngeal Swab     Status: None  ? Collection Time: 10/28/21 12:05 PM  ? Specimen: Nasopharyngeal Swab; Nasopharyngeal(NP) swabs in vial transport medium  ?Result Value Ref Range Status  ? SARS Coronavirus 2 by RT PCR NEGATIVE NEGATIVE Final  ?  Comment: (NOTE) ?SARS-CoV-2 target nucleic acids are NOT DETECTED. ? ?The SARS-CoV-2 RNA is generally detectable in upper respiratory ?specimens during the acute phase of infection. The lowest ?concentration of SARS-CoV-2 viral copies this assay can detect is ?138 copies/mL. A negative result does not preclude SARS-Cov-2 ?infection and should not be used as the sole basis for treatment or ?other patient management  decisions. A negative result may occur with  ?improper specimen collection/handling, submission of specimen other ?than nasopharyngeal swab, presence of viral mutation(s) within the ?areas targeted by this assay, and inadequate number of viral ?copies(<138 copies/mL). A negative result must be combined with ?clinical observations, patient history, and epidemiological ?information. The expected result is Negative. ? ?Fact Sheet for Patients:  ?EntrepreneurPulse.com.au ? ?Fact Sheet for Healthcare Providers:  ?IncredibleEmployment.be ? ?This test is no t yet approved or cleared by the [place]  FDA and  ?has been authorized for detection and/or diagnosis of SARS-CoV-2 by ?FDA under an Emergency Use Authorization (EUA). This EUA will remain  ?in effect (meaning this test can be used) for the dur

## 2021-10-30 NOTE — Evaluation (Signed)
Physical Therapy Evaluation ?Patient Details ?Name: Donald [name] ?MRN: [id number] ?DOB: 03/01/1956 ?Today's Date: 10/30/2021 ? ?History of Present Illness ? Patient is a 66 y/o male who presents on 5/1 with SOB and chest pain as well as URI symptoms. Found to have sepsis secondary to PNA and bil large pleural effusions s/p thoracentesis 5/2. PMH includes dementia, depression, anxiety, CVA, CAD, CHF, MI, suicide attempt.  ?Clinical Impression ? Patient presents with dypsnea at rest, worsened with exertion, decreased cardiopulmonary endurance, decreased activity tolerance, impaired balance and impaired mobility s/p above. Pt lives with her nephew who works and reports using [facility] for ambulation PTA as well as walking 2 miles daily. Pt is an inconsistent historian with STM deficits but sister present in room to verify pt is not at cognitive baseline. Today, tolerated short distance ambulation with min A and use of RW for support, limited due to 3/4 DOE. VSS on RA. Anticipate, pt will progress well with mobility with increased activity. Would benefit from rollator pending trial to improve energy conservation, endurance and activity tolerance. Will follow acutely to maximize independence and mobility prior to return home. ?   ? ?Recommendations for follow up therapy are one component of a multi-disciplinary discharge planning process, led by the attending physician.  Recommendations may be updated based on patient status, additional functional criteria and insurance authorization. ? ?Follow Up Recommendations Home health PT (pending progress) ? ?  ?Assistance Recommended at Discharge Intermittent Supervision/Assistance  ?Patient can return home with the following ? A little help with walking and/or transfers;A little help with bathing/dressing/bathroom;Assistance with cooking/housework;Help with stairs or ramp for entrance;Assist for transportation ? ?  ?Equipment Recommendations Rollator (4 wheels) (pending trial)   ?Recommendations for Other Services ?    ?  ?Functional Status Assessment Patient has had a recent decline in their functional status and demonstrates the ability to make significant improvements in function in a reasonable and predictable amount of time.  ? ?  ?Precautions / Restrictions Precautions ?Precautions: Fall ?Restrictions ?Weight Bearing Restrictions: No  ? ?  ? ?Mobility ? Bed Mobility ?Overal bed mobility: Needs Assistance ?Bed Mobility: Supine to Sit, Sit to Supine ?  ?  ?Supine to sit: Mod assist, HOB elevated ?Sit to supine: Supervision, HOB elevated ?  ?General bed mobility comments: Mod A to elevate trunk to get to EOB.  performed x2 ?  ? ?Transfers ?Overall transfer level: Needs assistance ?Equipment used: Rolling walker (2 wheels) ?Transfers: Sit to/from Stand ?Sit to Stand: Min assist ?  ?  ?  ?  ?  ?General transfer comment: Min A to power to standing with cues for hand placement/technique. Stood from [employer]. ?  ? ?Ambulation/Gait ?Ambulation/Gait assistance: Min assist ?Gait Distance (Feet): 20 Feet ?Assistive device: Rolling walker (2 wheels) ?Gait Pattern/deviations: Step-through pattern, Shuffle, Decreased step length - right, Decreased step length - left, Trunk flexed ?Gait velocity: decreased ?Gait velocity interpretation: <1.31 ft/sec, indicative of household ambulator ?  ?General Gait Details: Slow, short shuffling steps with flexed trunk and use of RW for support. 3/4 DOE. Difficulty with turns needing Min A. VSS on RA throughout. ? ?Stairs ?  ?  ?  ?  ?  ? ?Wheelchair Mobility ?  ? ?Modified Rankin (Stroke Patients Only) ?  ? ?  ? ?Balance Overall balance assessment: Needs assistance ?Sitting-balance support: Feet supported, No upper extremity supported ?Sitting balance-Leahy Scale: Good ?  ?  ?Standing balance support: During functional activity, Reliant on assistive device for balance ?Standing balance-Leahy  Scale: Poor ?Standing balance comment: Requires UE support ?  ?  ?  ?  ?   ?  ?  ?  ?  ?  ?  ?   ? ? ? ?Pertinent Vitals/Pain Pain Assessment ?Pain Assessment: Faces ?Faces Pain Scale: Hurts little more ?Pain Location: chest from coughing ?Pain Descriptors / Indicators: Sore, Discomfort ?Pain Intervention(s): Monitored during session, Repositioned  ? ? ?Home Living Family/patient expects to be discharged to:: Private residence ?Living Arrangements: Other relatives (nephew who works) ?Available Help at Discharge: Family;Available PRN/intermittently ?Type of Home: House ?Home Access: Stairs to enter ?Entrance Stairs-Rails: Right ?Entrance Stairs-Number of Steps: "a few" ?  ?Home Layout: One level ?Home Equipment: BSC/3in1;Rolling Walker (2 wheels);Cane - single point ?   ?  ?Prior Function Prior Level of Function : Independent/Modified Independent ?  ?  ?  ?  ?  ?  ?Mobility Comments: Uses SPC for ambulation, walks 2 miles per day. Does not drive. Does cooking. ?ADLs Comments: Independent ?  ? ? ?Hand Dominance  ? Dominant Hand: Left ? ?  ?Extremity/Trunk Assessment  ? Upper Extremity Assessment ?Upper Extremity Assessment: Defer to OT evaluation ?  ? ?Lower Extremity Assessment ?Lower Extremity Assessment: RLE deficits/detail;LLE deficits/detail ?RLE Sensation: decreased light touch ?LLE Sensation: decreased light touch ?  ? ?   ?Communication  ? Communication: No difficulties  ?Cognition Arousal/Alertness: Awake/alert ?Behavior During Therapy: [facility] for tasks assessed/performed ?Overall Cognitive Status: Impaired/Different from baseline ?Area of Impairment: Memory, Problem solving ?  ?  ?  ?  ?  ?  ?  ?  ?  ?  ?Memory: Decreased short-term memory ?  ?  ?  ?Problem Solving: Slow processing, Requires verbal cues ?General Comments: sister in room reports some worsened confusion now compared to baseline. Slow processing which is not new per sister ?  ?  ? ?  ?General Comments General comments (skin integrity, edema, etc.): VSS on RA throughout. Sister present during session. ? ?  ?Exercises     ? ?Assessment/Plan  ?  ?PT Assessment Patient needs continued PT services  ?PT Problem List Decreased mobility;Decreased cognition;Cardiopulmonary status limiting activity;Decreased activity tolerance;Decreased balance;Impaired sensation ? ?   ?  ?PT Treatment Interventions Therapeutic exercise;Patient/family education;Therapeutic activities;Balance training;Gait training;DME instruction;Stair training;Functional mobility training   ? ?PT Goals (Current goals can be found in the Care Plan section)  ?Acute Rehab PT Goals ?Patient Stated Goal: to be able to breathe ?PT Goal Formulation: With patient ?Time For Goal Achievement: 11/13/21 ?Potential to Achieve Goals: Good ? ?  ?Frequency Min 3X/week ?  ? ? ?Co-evaluation   ?  ?  ?  ?  ? ? ?  ?AM-PAC PT "6 Clicks" Mobility  ?Outcome Measure Help needed turning from your back to your side while in a flat bed without using bedrails?: A Lot ?Help needed moving from lying on your back to sitting on the side of a flat bed without using bedrails?: A Little ?Help needed moving to and from a bed to a chair (including a wheelchair)?: A Little ?Help needed standing up from a chair using your arms (e.g., wheelchair or bedside chair)?: A Little ?Help needed to walk in hospital room?: A Little ?Help needed climbing 3-5 steps with a railing? : A Lot ?6 Click Score: 16 ? ?  ?End of Session Equipment Utilized During Treatment: Gait belt ?Activity Tolerance: Patient limited by fatigue;Other (comment) (SOB) ?Patient left: in bed;with call bell/phone within reach;with bed alarm set;with family/visitor present ?Nurse Communication: Mobility  status ?PT Visit Diagnosis: Muscle weakness (generalized) (M62.81);Unsteadiness on feet (R26.81);Other (comment) (DOE) ?  ? ?Time: 4356-8616 ?PT Time Calculation (min) (ACUTE ONLY): 18 min ? ? ?Charges:   PT Evaluation ?$PT Eval Moderate Complexity: 1 Mod ?  ?  ?   ? ? ?Marisa Severin, PT, DPT ?Acute Rehabilitation Services ?Secure chat preferred ?Office  [phone] ? ? ? ? ?[place] ?10/30/2021, 12:43 PM ? ?

## 2021-10-30 NOTE — Progress Notes (Signed)
Modified Barium Swallow Progress Note ? ?Patient Details  ?Name: ARBY DAHIR ?MRN: [id number] ?Date of Birth: [birth year] ? ?Today's Date: 10/30/2021 ? ?Modified Barium Swallow completed.  Full report located under Chart Review in the Imaging Section. ? ?Brief recommendations include the following: ? ?Clinical Impression ? Pt exhibits moderate oropharyngeal dysphagia with anatomical impact marked by multiple and significant cervical fusions and cervical rigidity resulting in laryngeal penetration and aspiration of all consistencies except honey thick. Oral phase demonstrated decreased and prolonged manipulation, transit delays to posterior oral cavity and hesitations. There were delayed swallow onset to valleculae and pyriform sinuses intermittently from 3 to 8 seconds. Reduced laryngeal closure and osteophytes prevented epiglottis from ever fully deflecting reaching slightly less than horizontal position during swallows. Pharyngeal peristalsis was weak all resulting in PAS 6 with thin liquid, PAS 2,3 with nectar and PAS 3 with puree. Cued throat clear effective to clear penetrates that instance but inconsistently with others. Vallecular and pyriform sinus residue min-mild. Honey thick did not enter vestibule however residue was increased. There are increased opportunities with all consistencies and pt suspected to have a chronic pharyngeal dysphagia d/t cervical anatomy now exacerbated effects with current hospitalization. Recommend downgrade texture to Dys 2, nectar thick liquids, crush pills, volitional throat clear, multiple swallows. ST will follow. ?  ?Swallow Evaluation Recommendations ? ?   ? ? SLP Diet Recommendations: Dysphagia 2 (Fine chop) solids;Nectar thick liquid ? ? Liquid Administration via: Cup;No straw ? ? Medication Administration: Crushed with puree ? ? Supervision: Patient able to self feed;Full supervision/cueing for compensatory strategies ? ? Compensations: Slow rate;Small  sips/bites;Multiple dry swallows after each bite/sip;Clear throat intermittently ? ? Postural Changes: Seated upright at 90 degrees ? ? Oral Care Recommendations: Oral care BID ? ?   ? ? ? ?Houston Siren ?10/30/2021,11:01 AM ?

## 2021-10-30 NOTE — Progress Notes (Signed)
Initial Nutrition Assessment ? ?DOCUMENTATION CODES:  ? ?Non-severe (moderate) malnutrition in context of chronic illness ? ?INTERVENTION:  ?- Encourage PO intake ? ?- Nepro Shake po BID, each supplement provides 425 kcal and 19 grams protein ? ?- MVI with mineral daily ? ?- Order micronutrient labs: Vitamin A, folate, Vitamin B6, Vitamin B12, thiamine ? ?NUTRITION DIAGNOSIS:  ? ?Moderate Malnutrition related to chronic illness (cerebral aneurysm, CAD s/p CABG, CHF, CVA) as evidenced by moderate fat depletion, moderate muscle depletion. ? ?GOAL:  ? ?Patient will meet greater than or equal to 90% of their needs ? ?MONITOR:  ? ?PO intake, Supplement acceptance, Diet advancement, Labs, Weight trends ? ?REASON FOR ASSESSMENT:  ? ?Consult ?Assessment of nutrition requirement/status, Diet education ? ?ASSESSMENT:  ? ?Pt admitted with URI symptoms, found to have sepsis PNA. PMH significant for cerebral aneurysm, C. Diff colitis (2015), CAD s/p CABG, afib, hypothyroidism, CHF, and CVA. ? ?05/02: s/p thoracentesis  ? ?Per review of chart, pt noted to have memory loss, confusion over the years, slowly worsening. Pt sitting in bed during visit. He reports PTA he was eating foods that include chicken, no red meats, a variety of vegetables and biscuits. He endorses eating lunch. Observed lunch tray on counter with less than half of his meal eaten. Noted Nepro Shake on counter unopened. Pt is agreeable to trying them during admission while on nectar thick diet.  ? ?Pt endorses vision changes and that he is followed by and eye doctor. He reports taking Vitamin A and Vitamin B at home, unable to confirm this per chart review. Spoke with pt about checking micronutrient labs to assess for possible deficiencies. Pt is agreeable.  ? ?Pt endorses weight loss stating his usual weight is ~135 lbs. Reviewed weight history. There is limited documentation of recent weight history. Admit weight noted to be 58.3 kg. Will continue to monitor  throughout admission.  ? ?Medications: colace, remeron, IV abx ? ?Labs: Cr 1.50, alkaline phosphatase 12, lipase 58, GFR 51 ? ?NUTRITION - FOCUSED PHYSICAL EXAM: ? ?Flowsheet Row Most Recent Value  ?Orbital Region Moderate depletion  ?Upper Arm Region Moderate depletion  ?Thoracic and Lumbar Region Moderate depletion  ?Buccal Region Severe depletion  ?Temple Region Mild depletion  ?Clavicle Bone Region Moderate depletion  ?Clavicle and Acromion Bone Region Moderate depletion  ?Scapular Bone Region Moderate depletion  ?Dorsal Hand Mild depletion  ?Patellar Region Severe depletion  ?Anterior Thigh Region Moderate depletion  ?Posterior Calf Region Moderate depletion  ?Edema (RD Assessment) None  ?Hair Reviewed  ?Eyes Other (Comment)  [small white spot on inner R eye]  ?Mouth Reviewed  ?Skin Reviewed  ?Nails Reviewed  ? ?  ? ?Diet Order:   ?Diet Order   ? ?       ?  DIET DYS 2 Room service appropriate? Yes; Fluid consistency: Nectar Thick  Diet effective now       ?  ? ?  ?  ? ?  ? ? ?EDUCATION NEEDS:  ? ?Education needs have been addressed ? ?Skin:  Skin Assessment: Reviewed RN Assessment ? ?Last BM:  unknown ? ?Height:  ? ?Ht Readings from Last 1 Encounters:  ?10/28/21 '5\' 3"'$  (1.6 m)  ? ? ?Weight:  ? ?Wt Readings from Last 1 Encounters:  ?10/28/21 58.3 kg  ? ?BMI:  Body mass index is 22.77 kg/m?. ? ?Estimated Nutritional Needs:  ? ?Kcal:  1700-1900 ? ?Protein:  85-100g ? ?Fluid:  >/=1.7L ? ?Clayborne Dana, RDN, LDN ?Clinical Nutrition ?

## 2021-10-30 NOTE — Consult Note (Signed)
? ?NAME:  Donald [name], MRN:  [id number], DOB:  [birth year], LOS: 2 ?ADMISSION DATE:  10/28/2021, CONSULTATION DATE:  10/29/21 ?REFERRING MD:  TRH, CHIEF COMPLAINT:  SOB  ? ?History of Present Illness:  ?66 year old man with 40 pack year smoking history in distant past, CAD, GERD, CKD, depression, prior strokes who is presenting with variety of issues: ? ?- FTT, unintentional weight loss- months ?- DOE, orthopnea, dry cough- over months, worsening slowly ?- Memory loss, confusion- over years, worsening slowly ?- Anhedonia, depression- more recent due to loss of his longterm girlfriend ?- Dysphagia, chronic ? ?Imaging reveals bilateral large effusions for which PCCM is consulted. ? ?Echo being done looks to have acute on chronic loss of inotropy. ? ?Pertinent  Medical History  ?Distant heavy smoker ?Former Administrator ?GERD ?CAD ??Prior CABG, not in history but has wires for it ? ?Significant Hospital Events: ?Including procedures, antibiotic start and stop dates in addition to other pertinent events   ?5/2 admitted ? ?Interim History / Subjective:  ? ?No acute issues overnight.  ? ?Objective   ?Blood pressure (!) 111/99, pulse (!) 104, temperature 97.9 ?F (36.6 ?C), temperature source Oral, resp. rate 15, height '5\' 3"'$  (1.6 m), weight 58.3 kg, SpO2 90 %. ?   ?   ? ?Intake/Output Summary (Last 24 hours) at 10/30/2021 0759 ?Last data filed at 10/30/2021 0400 ?Gross per 24 hour  ?Intake 2610 ml  ?Output --  ?Net 2610 ml  ? ?Filed Weights  ? 10/28/21 0908  ?Weight: 58.3 kg  ? ? ?Examination: ?General: frail man in NAD ?HENT: tempora wasting, MMM ?Lungs: decreased breath sounds, scattered rales at bases ?Cardiovascular: RRR, ext warm ?Abdomen: soft, +BS ?Extremities: no edema, + muscle wasting ?Neuro: Moves all 4 ext to command but weak ?Skin: no rashes ? ?Resolved Hospital Problem list   ?N/A ? ?Assessment & Plan:  ?Bilateral pleural effusions- some combination of protein calorie malnutrition, heart failure, and CKD driving  s/p bilateral thoracentesis on 5/2 with pleural fluid studies consistent with transudative effusions.  ? ?Speech evaluation concerning for mild oral dysphagia and MBS showed moderate oropharyngeal dysphagia with anatomical impact marked by multiple and significant cervical fusions and cervical rigidity resulting in laryngeal penetration and aspiration of all consistencies except honey thick. ? ?Echo shows EF 25-30% ? ?Plan: ?- follow up cytology ?- MBS recs: downgrade texture to Dys 2, nectar thick liquids, crush pills, volitional throat clear, multiple swallows. ST will follow. ?- recommend cardiology consult for heart failure findings on echo ? ?PCCM to follow up on cytology ? ?Best Practice (right click and "Reselect all SmartList Selections" daily)  ?Per primary ? ?Labs   ?CBC: ?Recent Labs  ?Lab 10/28/21 ?0926 10/30/21 ?0258  ?WBC 8.5 12.0*  ?NEUTROABS 5.8  --   ?HGB 16.0 13.3  ?HCT 48.8 40.5  ?MCV 84.9 83.3  ?PLT 308 237  ? ? ?Basic Metabolic Panel: ?Recent Labs  ?Lab 10/28/21 ?0926 10/30/21 ?0258  ?NA 138 137  ?K 3.6 4.1  ?CL 104 108  ?CO2 21* 19*  ?GLUCOSE 146* 107*  ?BUN 18 19  ?CREATININE 1.61* 1.50*  ?CALCIUM 9.5 9.0  ?MG  --  1.7  ? ?GFR: ?Estimated Creatinine Clearance: 39 mL/min (A) (by C-G formula based on SCr of 1.5 mg/dL (H)). ?Recent Labs  ?Lab 10/28/21 ?0926 10/28/21 ?1505 10/28/21 ?1749 10/28/21 ?2011 10/29/21 ?1028 10/30/21 ?0258  ?PROCALCITON  --   --   --  <0.10 <0.10 <0.10  ?WBC 8.5  --   --   --   --  12.0*  ?LATICACIDVEN  --  2.9* 1.5  --   --   --   ? ? ?Liver Function Tests: ?Recent Labs  ?Lab 10/28/21 ?0926 10/30/21 ?0258  ?AST 24 23  ?ALT 25 30  ?ALKPHOS 17* 12*  ?BILITOT 1.1 1.5*  ?PROT 7.2 6.0*  ?ALBUMIN 3.4* 3.4*  ? ?Recent Labs  ?Lab 10/28/21 ?0926  ?LIPASE 58*  ? ?No results for input(s): AMMONIA in the last 168 hours. ? ?ABG ?No results found for: PHART, PCO2ART, PO2ART, HCO3, TCO2, ACIDBASEDEF, O2SAT  ? ?Coagulation Profile: ?No results for input(s): INR, PROTIME in the last 168  hours. ? ?Cardiac Enzymes: ?No results for input(s): CKTOTAL, CKMB, CKMBINDEX, TROPONINI in the last 168 hours. ? ?HbA1C: ?Hemoglobin A1C  ?Date/Time Value Ref Range Status  ?05/25/2014 04:06 AM 5.1 4.2 - 6.3 % Final  ?  Comment:  ?  The American Diabetes Association recommends that a primary goal of ?therapy should be <7% and that physicians should reevaluate the ?treatment regimen in patients with HbA1c values consistently >8%. ?  ? ?Hgb A1c MFr Bld  ?Date/Time Value Ref Range Status  ?01/17/2018 06:28 AM 5.7 (H) 4.8 - 5.6 % Final  ?  Comment:  ?  (NOTE) ?Pre diabetes:          5.7%-6.4% ?Diabetes:              >6.4% ?Glycemic control for   <7.0% ?adults with diabetes ?  ?06/11/2017 04:48 AM 5.4 4.8 - 5.6 % Final  ?  Comment:  ?  (NOTE) ?Pre diabetes:          5.7%-6.4% ?Diabetes:              >6.4% ?Glycemic control for   <7.0% ?adults with diabetes ?  ? ? ?CBG: ?No results for input(s): GLUCAP in the last 168 hours. ? ? Freda Jackson, MD ?[facility] ?Office: [phone] ? ? ?See Amion for personal pager ?PCCM on call pager [phone] until 7pm. ?Please call Elink 7p-7a. [phone] ? ? ? ? ?

## 2021-10-31 ENCOUNTER — Encounter (HOSPITAL_COMMUNITY): Payer: Self-pay | Admitting: Internal Medicine

## 2021-10-31 DIAGNOSIS — J9601 Acute respiratory failure with hypoxia: Secondary | ICD-10-CM | POA: Diagnosis not present

## 2021-10-31 DIAGNOSIS — J189 Pneumonia, unspecified organism: Secondary | ICD-10-CM | POA: Diagnosis not present

## 2021-10-31 DIAGNOSIS — J690 Pneumonitis due to inhalation of food and vomit: Secondary | ICD-10-CM | POA: Diagnosis not present

## 2021-10-31 DIAGNOSIS — J90 Pleural effusion, not elsewhere classified: Secondary | ICD-10-CM

## 2021-10-31 DIAGNOSIS — I5043 Acute on chronic combined systolic (congestive) and diastolic (congestive) heart failure: Secondary | ICD-10-CM | POA: Diagnosis not present

## 2021-10-31 DIAGNOSIS — I482 Chronic atrial fibrillation, unspecified: Secondary | ICD-10-CM | POA: Diagnosis not present

## 2021-10-31 LAB — BASIC METABOLIC PANEL
Anion gap: 10 (ref 5–15)
BUN: 24 mg/dL — ABNORMAL HIGH (ref 8–23)
CO2: 16 mmol/L — ABNORMAL LOW (ref 22–32)
Calcium: 8.3 mg/dL — ABNORMAL LOW (ref 8.9–10.3)
Chloride: 114 mmol/L — ABNORMAL HIGH (ref 98–111)
Creatinine, Ser: 1.41 mg/dL — ABNORMAL HIGH (ref 0.61–1.24)
GFR, Estimated: 55 mL/min — ABNORMAL LOW (ref >60)
Glucose, Bld: 105 mg/dL — ABNORMAL HIGH (ref 70–99)
Potassium: 4.5 mmol/L (ref 3.5–5.1)
Sodium: 140 mmol/L (ref 135–145)

## 2021-10-31 LAB — BRAIN NATRIURETIC PEPTIDE: B Natriuretic Peptide: 1371.3 pg/mL — ABNORMAL HIGH (ref 0.0–100.0)

## 2021-10-31 MED ORDER — SODIUM CHLORIDE 0.9 % IV SOLN: 250.0000 mL | INTRAVENOUS | Status: DC | PRN

## 2021-10-31 MED ORDER — SODIUM CHLORIDE 0.9% FLUSH
3.0000 mL | Freq: Two times a day (BID) | INTRAVENOUS | Status: DC
  Administered 2021-10-31 – 2021-11-04 (×8): 3 mL via INTRAVENOUS

## 2021-10-31 MED ORDER — FUROSEMIDE 10 MG/ML IJ SOLN
40.0000 mg | Freq: Two times a day (BID) | INTRAMUSCULAR | Status: DC
Start: 2021-10-31 — End: 2021-11-03
  Administered 2021-10-31 – 2021-11-03 (×6): 40 mg via INTRAVENOUS
  Filled 2021-10-31 (×6): qty 4

## 2021-10-31 MED ORDER — SODIUM CHLORIDE 0.9 % IV SOLN: INTRAVENOUS | Status: DC

## 2021-10-31 MED ORDER — FUROSEMIDE 10 MG/ML IJ SOLN
40.0000 mg | Freq: Once | INTRAMUSCULAR | Status: AC
  Administered 2021-10-31: 40 mg via INTRAVENOUS
  Filled 2021-10-31: qty 4

## 2021-10-31 MED ORDER — SODIUM CHLORIDE 0.9% FLUSH: 3.0000 mL | INTRAVENOUS | Status: DC | PRN

## 2021-10-31 MED ORDER — SODIUM CHLORIDE 0.9 % IV BOLUS
250.0000 mL | Freq: Once | INTRAVENOUS | Status: AC
  Administered 2021-10-31: 250 mL via INTRAVENOUS

## 2021-10-31 MED ORDER — ASPIRIN 81 MG PO CHEW
81.0000 mg | CHEWABLE_TABLET | ORAL | Status: AC
  Administered 2021-10-31: 81 mg via ORAL
  Filled 2021-10-31: qty 1

## 2021-10-31 MED ORDER — ARFORMOTEROL TARTRATE 15 MCG/2ML IN NEBU
15.0000 ug | INHALATION_SOLUTION | Freq: Two times a day (BID) | RESPIRATORY_TRACT | Status: DC
  Administered 2021-10-31 – 2021-11-05 (×9): 15 ug via RESPIRATORY_TRACT
  Filled 2021-10-31 (×10): qty 2

## 2021-10-31 MED ORDER — REVEFENACIN 175 MCG/3ML IN SOLN
175.0000 ug | Freq: Every day | RESPIRATORY_TRACT | Status: DC
  Administered 2021-11-02 – 2021-11-05 (×4): 175 ug via RESPIRATORY_TRACT
  Filled 2021-10-31 (×6): qty 3

## 2021-10-31 NOTE — Progress Notes (Signed)
Pt was taken off of BIPAP and placed on 2 L [place]. Pt is breathing in high 20s but claims that he is not SOB. CCM contacted and made aware. RT will monitor.  ?

## 2021-10-31 NOTE — TOC Initial Note (Signed)
Transition of Care (TOC) - Initial/Assessment Note  ? ? ?Patient Details  ?Name: Donald [name] ?MRN: [id number] ?Date of Birth: 31-Mar-1956 ? ?Transition of Care (TOC) CM/SW Contact:    ?Graves[facility], Ocie Cornfield, RN ?Phone Number: ?10/31/2021, 10:15 AM ? ?Clinical Narrative: Case Manager spoke with patient and sister at the bedside. Sister states PTA patient was from home with the nephew; however, the nephew works from 4:00 am-1:30 pm and cannot provide intermittent supervision. Sister is asking if the patient is appropriate for SNF. Case Manager stated that PT/OT will need to re consult and make recommendations. Secure chat sent to staff RN, CSW and PT to see if they can see the patient. Patient is currently on BIPAP and PT unable to work with the patient. Case Manager spoke with the patient and he is agreeable to SNF in [facility]. Medicare.gov list provided to the patient and sister for SNF choice. Case Manager discussed if patient needs long term SNF he will benefit from Medicaid- discussed the Department of Social Services. Case Manager will continue to follow for additional transition of care needs as the patient progresses.  ? ? ?Expected Discharge Plan: [place] ?Barriers to Discharge: Continued Medical Work up ? ? ?Patient Goals and CMS Choice ?Patient states their goals for this hospitalization and ongoing recovery are:: Plan for Rehab at the facility ?CMS Medicare.gov Compare Post Acute Care list provided to:: Patient ?Choice offered to / list presented to : Patient, Sibling ? ?Expected Discharge Plan and Services ?Expected Discharge Plan: [place] ?  ?Discharge Planning Services: CM Consult ?Post Acute Care Choice: [place] ?Living arrangements for the past 2 months: [place] ?                ?DME Arranged: N/A ?DME Agency: NA ?  ?  ?  ?HH Arranged: NA ?  ?  ?  ?  ? ?Prior Living Arrangements/Services ?Living arrangements for the past 2  months: [place] ?Lives with:: Relatives (Patient is from home with the nephew.) ?Patient language and need for interpreter reviewed:: Yes ?Do you feel safe going back to the place where you live?: Yes      ?Need for Family Participation in Patient Care: Yes (Comment) ?Care giver support system in place?: No (comment) ?Current home services: DME (Patient has a cane) ?Criminal Activity/Legal Involvement Pertinent to Current Situation/Hospitalization: No - Comment as needed ? ?Activities of Daily Living ?  ?  ? ?Permission Sought/Granted ?Permission sought to share information with : Family Supports, Customer service manager, Case Manager ?Permission granted to share information with : Yes, Verbal Permission Granted ?   ?   ?   ?   ? ?Emotional Assessment ?Appearance:: Appears stated age ?Attitude/Demeanor/Rapport: Engaged ?Affect (typically observed): Accepting ?Orientation: : Oriented to Self, Oriented to Place, Oriented to  Time, Oriented to Situation ?Alcohol / Substance Use: Not Applicable ?Psych Involvement: No (comment) ? ?Admission diagnosis:  Weakness [R53.1] ?Sepsis due to pneumonia ([place]) [J18.9, A41.9] ?Community acquired pneumonia, unspecified laterality [J18.9] ?Patient Active Problem List  ? Diagnosis Date Noted  ? Malnutrition of moderate degree 10/30/2021  ? Community acquired pneumonia 10/28/2021  ? Chronic combined systolic (congestive) and diastolic (congestive) heart failure ([place]) 10/28/2021  ? Chronic a-fib ([place]) 10/28/2021  ? Hypothyroidism 10/28/2021  ? Chronic kidney disease, stage 3a ([place]) 10/28/2021  ? DNR (do not resuscitate) 10/28/2021  ? Hyperlipidemia 04/15/2021  ? Chest pain 04/13/2021  ? Renal mass 02/25/2018  ? Chest  pain of uncertain etiology 35/00/9381  ? Left sided lacunar infarction ([place]) 01/17/2018  ? Vascular dementia with behavior disturbance ([place])   ? MDD (major depressive disorder), single episode, severe , no psychosis ([place]) 01/12/2018  ? BPH (benign  prostatic hyperplasia) 10/06/2017  ? Constipation 10/06/2017  ? History of stroke 06/11/2017  ? Cerebral ventriculomegaly   ? Altered mental status 06/10/2017  ? Abdominal pain 05/23/2017  ? Left sided abdominal pain 08/06/2016  ? Coronary artery disease 11/23/2014  ? Cerebral aneurysm 11/23/2014  ? ?PCP:  No primary care provider on file. ?Pharmacy:   ?[facility] [address] [facility] [place] ?[place] [address] ?Phone: [phone] Fax: [phone] ? ?[place], [place] - [address] ?[place] ?[place] [address] ?Phone: [phone] Fax: [phone] ? ?Readmission Risk Interventions ?   ? View : No data to display.  ?  ?  ?  ? ? ? ?

## 2021-10-31 NOTE — Progress Notes (Addendum)
? ?  NAME:  Donald [name], MRN:  [id number], DOB:  [birth year], LOS: 3 ?ADMISSION DATE:  10/28/2021, CONSULTATION DATE:  10/29/21 ?REFERRING MD:  TRH, CHIEF COMPLAINT:  SOB  ? ?History of Present Illness:  ?66 year old man with 40 pack year smoking history in distant past, CAD, GERD, CKD, depression, prior strokes who is presenting with variety of issues: ? ?- FTT, unintentional weight loss- months ?- DOE, orthopnea, dry cough- over months, worsening slowly ?- Memory loss, confusion- over years, worsening slowly ?- Anhedonia, depression- more recent due to loss of his longterm girlfriend ?- Dysphagia, chronic ? ?Imaging reveals bilateral large effusions for which PCCM is consulted. ? ?Echo being done looks to have acute on chronic loss of inotropy. ? ?Pertinent  Medical History  ?Distant heavy smoker ?Former Administrator ?GERD ?CAD ??Prior CABG, not in history but has wires for it ? ?Significant Hospital Events: ?Including procedures, antibiotic start and stop dates in addition to other pertinent events   ?5/2 admitted ?5/4 overnight required increased O2 requirements and SOB; placed on bipap; hypotensive given IV fluids ? ?Interim History / Subjective:  ? ?Overnight required increased O2 requirements and SOB; RR 40s; placed on bipap; hypotensive given IV fluids. BS with rhonchi and crackles b/l. ABG wnl. CXR concerning for possible aspiration. Given IV lasix.  ? ?Patient now on 2L [place]. Sats 99% ?Denies SOB ?Non productive cough ? ?Objective   ?Blood pressure (!) 89/60, pulse 98, temperature (!) 97.2 ?F (36.2 ?C), temperature source Axillary, resp. rate 15, height '5\' 3"'$  (1.6 m), weight 58.3 kg, SpO2 100 %. ?   ?FiO2 (%):  [40 %] 40 %  ? ?Intake/Output Summary (Last 24 hours) at 10/31/2021 1050 ?Last data filed at 10/31/2021 0602 ?Gross per 24 hour  ?Intake 110 ml  ?Output 140 ml  ?Net -30 ml  ? ? ?Filed Weights  ? 10/28/21 0908  ?Weight: 58.3 kg  ? ? ?Examination: ?General:   NAD cachectic elderly male ?HEENT: MM pink/moist;  [place] in place ?Neuro: Aox3; MAE ?CV: s1s2, RRR, no m/r/g ?PULM:  dim BS bilaterally; [place] 2L  ?GI: soft, bsx4 active  ?Extremities: warm/dry, no edema  ?Skin: no rashes or lesions appreciated ? ? ?Resolved Hospital Problem list   ?N/A ? ?Assessment & Plan:  ?Acute Respiratory Failure w/ hypoxia ?Bilateral pleural effusions- some combination of protein calorie malnutrition, heart failure, and CKD driving s/p bilateral thoracentesis on 5/2 with pleural fluid studies consistent with transudative effusions.  ?Concern for possible aspiration ?-Speech evaluation concerning for mild oral dysphagia and MBS showed moderate oropharyngeal dysphagia with anatomical impact marked by multiple and significant cervical fusions and cervical rigidity resulting in laryngeal penetration and aspiration of all consistencies except honey thick. ?Acute on chronic combined systolic/diastolic CHF-Echo shows EF 25-30% ?P: ?-will place back on [address]; wean for sats >92% ?-bipap prn ?-lasix given this am; Currently only 140 ml UOP document; consider another dose of lasix ?-trend CXR ?-cytology negative ?-PCT <0.10; currently on azithro/rocephin; consider narrowing abx for aspiration ppx ?-cards following; appreciate recs ?-Pulm toiletry: IS/Flutter ? ? ?Best Practice (right click and "Reselect all SmartList Selections" daily)  ?Per primary ? ? ?JD Rollene Rotunda, PA-C ?[facility] ?10/31/2021, 11:20 AM ? ?Please see [email or web address] for pager details. ? ?From 7A-7P if no response, please call [phone]. ?After hours, please call Warren Lacy [phone]. ? ? ? ? ? ?

## 2021-10-31 NOTE — Progress Notes (Signed)
PT was placed on BIPAP 16/6 RR10 40% per MD order. Pt is breathing in 40s when RT entered room and had BL coarse crackles throughout. Pt is tolerating well at this time. RT will monitor.  ?

## 2021-10-31 NOTE — Progress Notes (Signed)
?PROGRESS NOTE ?  ?Donald [name]  BJS:[id number]    DOB: 05-Apr-1956    DOA: 10/28/2021 ? ?PCP: No primary care provider on file.  ? ?I have briefly reviewed patients previous medical records in [facility]. ? ?Chief Complaint  ?Patient presents with  ? Shortness of Breath  ? Chest Pain  ? ? ?Brief Narrative:  ? 66 year old male, lives with his nephew and independent, medical history significant for CAD s/p CABG x3, chronic systolic CHF with apical aneurysm, HLD, CVA, CKD stage, brain aneurysm, paroxysmal A-fib s/p ILR, remote history of smoking, GERD, anxiety and depression, presented to ED with unintentional weight loss and failure to thrive over several months, progressive dyspnea on exertion, orthopnea and dry cough, progressive worsening memory loss and confusion and chronic dysphagia.  In ED, Tmax of 96 degrees, respiratory rate of 30 and CT chest showed bilateral large pleural effusion and possible multifocal pneumonia.  Admitted for sepsis due to suspected pneumonia, large bilateral pleural effusions.  PCCM consulted and s/p bilateral thoracentesis, fluid transudative, culture and cytology pending.  Worsening cardiomyopathy, cardiology consulted.  Overnight 5/3 developed worsening dyspnea, suspected due to acute pulmonary edema, given IV Lasix and placed on BiPAP. ? ? ?Assessment & Plan:  ?Principal Problem: ?  Community acquired pneumonia ?Active Problems: ?  Coronary artery disease ?  History of stroke ?  Acute on chronic combined systolic and diastolic CHF (congestive heart failure) ([place]) ?  Chronic a-fib ([place]) ?  Vascular dementia with behavior disturbance ([place]) ?  Hyperlipidemia ?  Hypothyroidism ?  Chronic kidney disease, stage 3a ([place]) ?  DNR (do not resuscitate) ?  Malnutrition of moderate degree ?  Acute respiratory failure with hypoxia ([place]) ?  Pleural effusion ? ? ?Sepsis due to suspected aspiration pneumonia: Met SIRS criteria on admission including hypothermia and tachypnea.  CTA chest  showed multifocal pneumonia and bilateral moderate to large pleural effusions.  COVID-19 and flu RT-PCR negative.  Urinary pneumococcal and Legionella antigen negative.  Blood cultures negative to date.  Lactate was up to 2.9.  Procalcitonin negative.  QuantiFERON-TB gold results pending.  Treated with IV fluids.  Remains on empiric IV ceftriaxone and oral azithromycin, complete 5 days course.  Sepsis physiology resolved. ?Bilateral moderate to large pleural effusions: PCCM was consulted and underwent bilateral thoracentesis on 5/2 (1.4 L on right and 1.3 L on left).  Fluid transudative in nature.  Cultures negative to date.  Cytology from bilateral pleural fluid: Reactive mesothelial cells.  Improved.  May be related to worsening cardiomyopathy and possible CHF. ?Acute respiratory failure with hypoxia: Overnight 5/3, hypotensive, bolused with IV fluids, then worsening dyspnea, chest x-ray with pulmonary edema, given IV Lasix, placed on BiPAP, now transition to 2 L/min [place] oxygen.  Monitor closely. ?Acute on chronic systolic CHF: Prior LVEF 60-73%.  Echo this admission showed further reduced EF to 25-30% with wall motion abnormalities.  Cardiology consultation appreciated and plan cardiac cath when medically stable, hopefully 5/5.  Last dose of Eliquis 5/3 night.  Continue aspirin, statins, Coreg and Imdur.  Overnight decompensation likely related to IV fluid bolus.  Continue IV Lasix 40 mg twice daily. ?Paroxysmal A-fib s/p ILR: Currently in sinus rhythm.  Eliquis held for upcoming cardiac cath.  Continue beta-blockers. ?CAD s/p CABG: History of intermittent chest pains and dyspnea for few months.  Continue aspirin, statins, beta-blocker and Imdur.  Cardiology plans cardiac cath 5/4. ?Hyperlipidemia: Continue statins ?Stage IIIa CKD: Labile serum creatinine but appears to be close to his  baseline. ?Dysphagia: As per SLP, dysphagia 2 solids and nectar thickened liquids. ??  COPD: No clinical exacerbation.  Remote  smoking history. ?Prior CVA with brain aneurysm: On Plavix ?Suspected vascular dementia: On Aricept. ?Depression & Anxiety: Continue current meds. ?Hypothyroid history: Not on meds.  TSH 0.960. ?Non-anion gap metabolic acidosis:?  Related to mild hyperchloremia.  ABG pH 7.48, PCO2 23.  Serum bicarb 16.  Follow BMP in AM. ? ?Body mass index is 22.77 kg/m?. ? ?Nutritional Status ?Nutrition Problem: Moderate Malnutrition ?Etiology: chronic illness (cerebral aneurysm, CAD s/p CABG, CHF, CVA) ?Signs/Symptoms: moderate fat depletion, moderate muscle depletion ?Interventions: MVI, Nepro shake, Refer to RD note for recommendations ? ?Pressure Ulcer: ?  ? ? ?DVT prophylaxis:   Patient on Eliquis, currently held for cardiac cath ?  Code Status: DNR:  ?Family Communication: Sister and brother at bedside ?Disposition:  ?Status is: Inpatient ?Remains inpatient appropriate because: Further evaluation including cardiac cath.  Remains on IV antibiotics, IV Lasix and upcoming cardiac cath. ?  ? ? ?Consultants:   ?PCCM ?Cardiology ? ?Procedures:   ?Bilateral diagnostic and therapeutic thoracentesis ?BiPAP ? ?Antimicrobials:   ?As noted above ? ? ?Subjective:  ?Overnight events noted, worsening dyspnea, on BiPAP.  Unable to understand speech due to BiPAP but seem to indicate that he is better. ? ?Objective:  ? ?Vitals:  ? 10/31/21 0504 10/31/21 0803 10/31/21 1049 10/31/21 1252  ?BP: (!) 181/98 (!) 89/60  (!) 141/85  ?Pulse: (!) 108 98  78  ?Resp: 19 15 (!) 32 (!) 23  ?Temp: 97.8 ?F (36.6 ?C) (!) 97.2 ?F (36.2 ?C)  98.2 ?F (36.8 ?C)  ?TempSrc: Oral Axillary  Oral  ?SpO2: 93% 100%  97%  ?Weight:      ?Height:      ? ? ?General exam: Middle-age male, looks older than stated age, moderately built, frail and cachectic, bitemporal wasting, lying propped up in bed.  On BiPAP with intermittent mild increased work of breathing but not severe. ?Respiratory system: Harsh and diminished breath sounds bilaterally, especially in the bases with few  bibasal crackles.  No rhonchi or wheezing. ?Cardiovascular system: S1 & S2 heard, RRR. No JVD, murmurs, rubs, gallops or clicks. No pedal edema. ?Gastrointestinal system: Abdomen is nondistended, soft and nontender. No organomegaly or masses felt. Normal bowel sounds heard. ?Central nervous system: Alert and oriented. No focal neurological deficits. ?Extremities: Symmetric 5 x 5 power. ?Skin: No rashes, lesions or ulcers ?Psychiatry: Judgement and insight impaired. Mood & affect flat. ? ? ? ?Data Reviewed:   ?I have personally reviewed following labs and imaging studies ? ? ?CBC: ?Recent Labs  ?Lab 10/28/21 ?0926 10/30/21 ?0258  ?WBC 8.5 12.0*  ?NEUTROABS 5.8  --   ?HGB 16.0 13.3  ?HCT 48.8 40.5  ?MCV 84.9 83.3  ?PLT 308 237  ? ? ?Basic Metabolic Panel: ?Recent Labs  ?Lab 10/28/21 ?0926 10/30/21 ?0258 10/31/21 ?0340  ?NA 138 137 140  ?K 3.6 4.1 4.5  ?CL 104 108 114*  ?CO2 21* 19* 16*  ?GLUCOSE 146* 107* 105*  ?BUN 18 19 24*  ?CREATININE 1.61* 1.50* 1.41*  ?CALCIUM 9.5 9.0 8.3*  ?MG  --  1.7  --   ? ? ?Liver Function Tests: ?Recent Labs  ?Lab 10/28/21 ?0926 10/30/21 ?0258  ?AST 24 23  ?ALT 25 30  ?ALKPHOS 17* 12*  ?BILITOT 1.1 1.5*  ?PROT 7.2 6.0*  ?ALBUMIN 3.4* 3.4*  ? ? ?CBG: ?No results for input(s): GLUCAP in the last 168 hours. ? ?Microbiology Studies:  ? ?  Recent Results (from the past 240 hour(s))  ?Resp Panel by RT-PCR (Flu A&B, Covid) Nasopharyngeal Swab     Status: None  ? Collection Time: 10/28/21 12:05 PM  ? Specimen: Nasopharyngeal Swab; Nasopharyngeal(NP) swabs in vial transport medium  ?Result Value Ref Range Status  ? SARS Coronavirus 2 by RT PCR NEGATIVE NEGATIVE Final  ?  Comment: (NOTE) ?SARS-CoV-2 target nucleic acids are NOT DETECTED. ? ?The SARS-CoV-2 RNA is generally detectable in upper respiratory ?specimens during the acute phase of infection. The lowest ?concentration of SARS-CoV-2 viral copies this assay can detect is ?138 copies/mL. A negative result does not preclude SARS-Cov-2 ?infection  and should not be used as the sole basis for treatment or ?other patient management decisions. A negative result may occur with  ?improper specimen collection/handling, submission of specimen other ?than nas

## 2021-10-31 NOTE — Progress Notes (Signed)
Patient noted  breathing with gargling sound, alert and oriented , denies any pain, with weak cough, unable to cough out phlegm ,crackles heared bil lung on auscultation , bp 146./96 mmhg. O2 4l [place] maintained , o2 sat 98% , HR 114-110 , Dr Marlyce Huge notified. Oral suctioning done ,wqith small amount of thin whitish secretions obtained., wiith poor urine output,  Md updated of status , stated wuill come and evaluate patient in room.Marland Kitchen ?
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
## 2021-10-31 NOTE — Progress Notes (Signed)
PT Cancellation Note ? ?Patient Details ?Name: Donald [name] ?MRN: [id number] ?DOB: [birth year] ? ? ?Cancelled Treatment:    Reason Eval/Treat Not Completed: Patient not medically ready (pt with respiratory distress overnight and remains on bipap not currently appropriate for mobility, discussed with RN) ? ? ?Oluwatomisin Deman B Tameko Halder ?10/31/2021, 8:30 AM ?Georga Stys P, PT ?Acute Rehabilitation Services ?Pager: [phone] ?Office: [phone] ? ?

## 2021-10-31 NOTE — Progress Notes (Signed)
Pt shows no sign of resp distress at this time.  BIPAP on standby PRN ?

## 2021-10-31 NOTE — Progress Notes (Signed)
SLP Cancellation Note ? ?Patient Details ?Name: Donald [name] ?MRN: [id number] ?DOB: [birth year] ? ? ?Cancelled treatment:        Attempted to see with po recommendation after yesterday's MBS. Pt is presently on Bipap. Will continue efforts.  ? ? ?Houston Siren ?10/31/2021, 9:24 AM ?

## 2021-10-31 NOTE — Progress Notes (Addendum)
HOSPITAL MEDICINE OVERNIGHT EVENT NOTE   ? ?The evening of 5/3, was notified by nursing that patient was seemingly developing mild shortness of breath.  At that time patient exhibited no evidence of associated hypoxia.  At that time a rapid response was called.   ? ?Upon Response evaluation, ABG was performed on 2 L of oxygen via nasal cannula revealing no evidence of hypoxic or hypercapnic respiratory failure.  X-ray revealed patchy bilateral lower lobe infiltrates however upon rapid response evaluation their assessment was that the patient appeared volume depleted with poor skin turgor and poor urine output.  Patient's chest x-ray was felt to possibly be due to to developing pneumonia.  A 250 cc bolus was administered.  Patient at that time stated that his breathing had returned to baseline and nursing felt that he was breathing comfortably. ? ?Several hours later due to continued poor urine output a second 250 cc bolus was administered.  Patient continued to be breathing relatively comfortably albeit with intermittent mild tachypnea. ? ?Later in the morning at approximately 6 AM patient's shortness of breath suddenly took a turn for the worse with the patient's respiratory rate increasing to upwards of 40 times per minute. ? ?At this point I went to go evaluate the patient at the bedside.  Patient appeared to be in respiratory distress.  Patient is lethargic but arousable.  While patient does exhibit poor skin turgor and continues to have minimal dark urine output the patient's lung exam reveals coarse diffuse rales concerning for either progressive bilateral pneumonia versus acute pulmonary edema. ? ?Review of labs over the past 24 hours reveals that the patient has been afebrile within normal procalcitonin making it highly likely that the most already of this is secondary to progressive pulmonary edema.  Placing patient on BiPAP for work of breathing and administering 40 mg of intravenous Lasix now. ? ?Continue  to monitor patient closely.  Will communicate patient's clinical status to day team.  ? ?Vernelle Emerald  MD ?[facility]  ? ? ? ? ? ? ? ? ? ? ?
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
## 2021-10-31 NOTE — Plan of Care (Signed)
?  Problem: Activity: ?Goal: Ability to tolerate increased activity will improve ?10/31/2021 1556 by Drucie Ip I, RN ?Outcome: Not Progressing ?10/31/2021 1556 by Drucie Ip I, RN ?Outcome: Progressing ?  ?Problem: Clinical Measurements: ?Goal: Ability to maintain a body temperature in the normal range will improve ?10/31/2021 1556 by Drucie Ip I, RN ?Outcome: Not Progressing ?10/31/2021 1556 by Drucie Ip I, RN ?Outcome: Progressing ?  ?Problem: Respiratory: ?Goal: Ability to maintain adequate ventilation will improve ?10/31/2021 1556 by Drucie Ip I, RN ?Outcome: Not Progressing ?10/31/2021 1556 by Drucie Ip I, RN ?Outcome: Progressing ?Goal: Ability to maintain a clear airway will improve ?10/31/2021 1556 by Drucie Ip I, RN ?Outcome: Not Progressing ?10/31/2021 1556 by Drucie Ip I, RN ?Outcome: Progressing ?  ?

## 2021-10-31 NOTE — Progress Notes (Signed)
PCCM Update: ? ?Cytology from pleural fluid is negative for malignant cells.  ? ?PCCM will sign off, please call with any further questions. ? ?Freda Jackson, MD ?[facility] ?Office: [phone] ? ? ?See Amion for personal pager ?PCCM on call pager [phone] until 7pm. ?Please call Elink 7p-7a. [phone] ? ?

## 2021-11-01 ENCOUNTER — Telehealth: Payer: Self-pay | Admitting: Pulmonary Disease

## 2021-11-01 ENCOUNTER — Inpatient Hospital Stay (HOSPITAL_COMMUNITY): Payer: Medicare HMO

## 2021-11-01 ENCOUNTER — Inpatient Hospital Stay (HOSPITAL_COMMUNITY)
Admission: EM | Disposition: A | Discharge status: Skilled Nursing Facility | Payer: Self-pay | Source: Home / Self Care | Attending: Internal Medicine

## 2021-11-01 DIAGNOSIS — E876 Hypokalemia: Secondary | ICD-10-CM | POA: Diagnosis not present

## 2021-11-01 DIAGNOSIS — Z8673 Personal history of transient ischemic attack (TIA), and cerebral infarction without residual deficits: Secondary | ICD-10-CM

## 2021-11-01 DIAGNOSIS — I482 Chronic atrial fibrillation, unspecified: Secondary | ICD-10-CM | POA: Diagnosis not present

## 2021-11-01 DIAGNOSIS — J90 Pleural effusion, not elsewhere classified: Secondary | ICD-10-CM

## 2021-11-01 DIAGNOSIS — I4729 Other ventricular tachycardia: Secondary | ICD-10-CM | POA: Diagnosis not present

## 2021-11-01 DIAGNOSIS — N1831 Chronic kidney disease, stage 3a: Secondary | ICD-10-CM | POA: Diagnosis not present

## 2021-11-01 DIAGNOSIS — J189 Pneumonia, unspecified organism: Secondary | ICD-10-CM | POA: Diagnosis not present

## 2021-11-01 DIAGNOSIS — J9601 Acute respiratory failure with hypoxia: Secondary | ICD-10-CM | POA: Diagnosis not present

## 2021-11-01 DIAGNOSIS — I5043 Acute on chronic combined systolic (congestive) and diastolic (congestive) heart failure: Secondary | ICD-10-CM | POA: Diagnosis not present

## 2021-11-01 LAB — BODY FLUID CULTURE W GRAM STAIN
Culture: NO GROWTH
Culture: NO GROWTH

## 2021-11-01 LAB — BASIC METABOLIC PANEL
Anion gap: 12 (ref 5–15)
BUN: 26 mg/dL — ABNORMAL HIGH (ref 8–23)
CO2: 21 mmol/L — ABNORMAL LOW (ref 22–32)
Calcium: 8.5 mg/dL — ABNORMAL LOW (ref 8.9–10.3)
Chloride: 108 mmol/L (ref 98–111)
Creatinine, Ser: 1.69 mg/dL — ABNORMAL HIGH (ref 0.61–1.24)
GFR, Estimated: 44 mL/min — ABNORMAL LOW (ref >60)
Glucose, Bld: 105 mg/dL — ABNORMAL HIGH (ref 70–99)
Potassium: 3.1 mmol/L — ABNORMAL LOW (ref 3.5–5.1)
Sodium: 141 mmol/L (ref 135–145)

## 2021-11-01 LAB — MAGNESIUM: Magnesium: 1.8 mg/dL (ref 1.7–2.4)

## 2021-11-01 SURGERY — INVASIVE LAB ABORTED CASE

## 2021-11-01 MED ORDER — NALOXONE HCL 0.4 MG/ML IJ SOLN
INTRAMUSCULAR | Status: AC
  Filled 2021-11-01: qty 1

## 2021-11-01 MED ORDER — MIDAZOLAM HCL 2 MG/2ML IJ SOLN
INTRAMUSCULAR | Status: AC
  Filled 2021-11-01: qty 2

## 2021-11-01 MED ORDER — LIDOCAINE HCL (PF) 1 % IJ SOLN
INTRAMUSCULAR | Status: AC
  Filled 2021-11-01: qty 30

## 2021-11-01 MED ORDER — NALOXONE HCL 0.4 MG/ML IJ SOLN
INTRAMUSCULAR | Status: DC | PRN
  Administered 2021-11-01 (×2): .4 mg via INTRAVENOUS

## 2021-11-01 MED ORDER — MIDAZOLAM HCL 2 MG/2ML IJ SOLN
INTRAMUSCULAR | Status: DC | PRN
Start: 2021-11-01 — End: 2021-11-01
  Administered 2021-11-01: 1 mg via INTRAVENOUS

## 2021-11-01 MED ORDER — FENTANYL CITRATE (PF) 100 MCG/2ML IJ SOLN
INTRAMUSCULAR | Status: AC
  Filled 2021-11-01: qty 2

## 2021-11-01 MED ORDER — POTASSIUM CHLORIDE 10 MEQ/100ML IV SOLN
10.0000 meq | INTRAVENOUS | Status: AC
  Administered 2021-11-01 (×3): 10 meq via INTRAVENOUS
  Filled 2021-11-01 (×3): qty 100

## 2021-11-01 MED ORDER — HEPARIN (PORCINE) IN NACL 1000-0.9 UT/500ML-% IV SOLN
INTRAVENOUS | Status: DC | PRN
  Administered 2021-11-01 (×2): 500 mL

## 2021-11-01 MED ORDER — VERAPAMIL HCL 2.5 MG/ML IV SOLN
INTRAVENOUS | Status: AC
  Filled 2021-11-01: qty 2

## 2021-11-01 MED ORDER — HEPARIN (PORCINE) IN NACL 1000-0.9 UT/500ML-% IV SOLN
INTRAVENOUS | Status: AC
  Filled 2021-11-01: qty 1000

## 2021-11-01 MED ORDER — ASPIRIN 81 MG PO CHEW
81.0000 mg | CHEWABLE_TABLET | ORAL | Status: AC
  Administered 2021-11-01: 81 mg via ORAL
  Filled 2021-11-01: qty 1

## 2021-11-01 MED ORDER — MAGNESIUM SULFATE IN D5W 1-5 GM/100ML-% IV SOLN
1.0000 g | Freq: Once | INTRAVENOUS | Status: AC
  Administered 2021-11-01: 1 g via INTRAVENOUS
  Filled 2021-11-01 (×2): qty 100

## 2021-11-01 MED ORDER — FENTANYL CITRATE (PF) 100 MCG/2ML IJ SOLN
INTRAMUSCULAR | Status: DC | PRN
  Administered 2021-11-01: 25 ug via INTRAVENOUS

## 2021-11-01 MED ORDER — POTASSIUM CHLORIDE CRYS ER 20 MEQ PO TBCR: 30.0000 meq | EXTENDED_RELEASE_TABLET | ORAL | Status: DC

## 2021-11-01 MED ORDER — SODIUM CHLORIDE 0.9 % IV SOLN: Freq: Once | INTRAVENOUS | Status: AC

## 2021-11-01 MED ORDER — POTASSIUM CHLORIDE 20 MEQ PO PACK
40.0000 meq | PACK | Freq: Once | ORAL | Status: AC
  Administered 2021-11-01: 40 meq via ORAL
  Filled 2021-11-01: qty 2

## 2021-11-01 SURGICAL SUPPLY — 8 items
CATH INFINITI 5FR MULTPACK ANG (CATHETERS) ×4 IMPLANT
ELECT DEFIB PAD ADLT CADENCE (PAD) ×2 IMPLANT
KIT HEART LEFT (KITS) ×3 IMPLANT
PACK CARDIAC CATHETERIZATION (CUSTOM PROCEDURE TRAY) ×3 IMPLANT
SHEATH PINNACLE 5F 10CM (SHEATH) ×2 IMPLANT
TRANSDUCER W/STOPCOCK (MISCELLANEOUS) ×3 IMPLANT
TUBING CIL FLEX 10 FLL-RA (TUBING) ×3 IMPLANT
WIRE EMERALD 3MM-J .035X150CM (WIRE) ×2 IMPLANT

## 2021-11-01 NOTE — Telephone Encounter (Signed)
Please schedule patient for hospital follow up in 4-6 weeks for respiratory failure and evaluation of obstructive lung disease with myself or one of the APPs.  ? ?Thanks, ?Freda Jackson, MD ?[facility] ?Office: [phone] ? ? ?

## 2021-11-01 NOTE — Telephone Encounter (Signed)
Patient is scheduled for HFU on 12/02/2021 at 10:15am with JD, appointment reminder mailed to address on file. Nothing further needed. ?

## 2021-11-01 NOTE — Progress Notes (Signed)
Spoke with Dr. Oval Linsey by phone, informed of pts BP, see flowsheets, order obtained, See MAR, ok to send pt to [place] as long as arousable, pt arousable and oriented to self, place, and situation, re-oriented to time, safety maintained ?

## 2021-11-01 NOTE — Progress Notes (Addendum)
?PROGRESS NOTE ?  ?Donald [name]  DUK:[id number]    DOB: 09-02-1955    DOA: 10/28/2021 ? ?PCP: No primary care provider on file.  ? ?I have briefly reviewed patients previous medical records in [facility]. ? ?Chief Complaint  ?Patient presents with  ? Shortness of Breath  ? Chest Pain  ? ? ?Brief Narrative:  ? 66 year old male, lives with his nephew and independent, medical history significant for CAD s/p CABG x3, chronic systolic CHF with apical aneurysm, HLD, CVA, CKD stage, brain aneurysm, paroxysmal A-fib s/p ILR, remote history of smoking, GERD, anxiety and depression, presented to ED with unintentional weight loss and failure to thrive over several months, progressive dyspnea on exertion, orthopnea and dry cough, progressive worsening memory loss and confusion and chronic dysphagia.  In ED, Tmax of 96 degrees, respiratory rate of 30 and CT chest showed bilateral large pleural effusion and possible multifocal pneumonia.  Admitted for sepsis due to suspected pneumonia, large bilateral pleural effusions.  PCCM consulted and s/p bilateral thoracentesis, fluid transudative, culture and cytology pending.  Worsening cardiomyopathy, cardiology consulted.  Overnight 5/3 developed worsening dyspnea, suspected due to acute pulmonary edema, given IV Lasix and placed on BiPAP.  Clinically much improved. At Cath Lab on 5/5, developed transient hypotension and hypoxia after 1 mg of Versed and 25 mcg of fentanyl and hence cath canceled, plans for outpatient ischemic evaluation. ? ? ?Assessment & Plan:  ?Principal Problem: ?  Community acquired pneumonia ?Active Problems: ?  Coronary artery disease ?  History of stroke ?  Acute on chronic combined systolic and diastolic CHF (congestive heart failure) ([place]) ?  Chronic a-fib ([place]) ?  Vascular dementia with behavior disturbance ([place]) ?  Hyperlipidemia ?  Hypothyroidism ?  Chronic kidney disease, stage 3a ([place]) ?  DNR (do not resuscitate) ?  Malnutrition of moderate  degree ?  Acute respiratory failure with hypoxia ([place]) ?  Pleural effusion ? ? ?Sepsis due to suspected aspiration pneumonia: Met SIRS criteria on admission including hypothermia and tachypnea.  CTA chest showed multifocal pneumonia and bilateral moderate to large pleural effusions.  COVID-19 and flu RT-PCR negative.  Urinary pneumococcal and Legionella antigen negative.  Blood cultures negative to date.  Lactate was up to 2.9.  Procalcitonin negative.  QuantiFERON-TB gold results still pending.  Treated with IV fluids.  Levofloxacin 750 Mg x1 on 5/1 followed by empiric IV ceftriaxone and oral azithromycin since 5/3, complete 5 days course.  Sepsis physiology resolved.  DC all antibiotics after 5/6 doses. ?Bilateral moderate to large pleural effusions: PCCM was consulted and underwent bilateral thoracentesis on 5/2 (1.4 L on right and 1.3 L on left).  Fluid transudative in nature.  Cultures negative to date.  Cytology from bilateral pleural fluid: Reactive mesothelial cells.  Improved.  May be related to worsening cardiomyopathy and possible CHF. ?Acute respiratory failure with hypoxia: Overnight 5/3, hypotensive, bolused with IV fluids, then worsening dyspnea, chest x-ray with pulmonary edema, given IV Lasix, placed on BiPAP, now transition to 2 L/min [place] oxygen.  Clinically much improved.  Now on 2 L/min [place] oxygen, wean off as tolerated.  PCCM signed off and have arranged outpatient follow-up. ?Acute on chronic systolic CHF: Prior LVEF 75-43%.  Echo this admission showed further reduced EF to 25-30% with wall motion abnormalities.  Cardiology consultation appreciated and has gone for cardiac cath 5/5.  Last dose of Eliquis 5/3 night.  Continue aspirin, statins, Coreg and Imdur.  Overnight decompensation likely related to IV fluid bolus.  Continue IV Lasix 40 mg twice daily.  Clinically appears euvolemic.  Defer diuretic management to cardiology. ?Paroxysmal A-fib s/p ILR: Currently in sinus rhythm.  Eliquis held  for cardiac cath, cath canceled and so this can be resumed.  Continue beta-blockers. ?NSVT: 19 beat NSVT noted on telemetry on 5/4 at 10:21 PM.  Likely related to cardiomyopathy.  Continue beta-blockers.  Keep K >4 and magnesium >2. ?CAD s/p CABG: History of intermittent chest pains and dyspnea for few months.  Continue aspirin, statins, beta-blocker and Imdur.  Cardiac cath attempt 5/5 unsuccessful due to hypotension and hypoxia after premeds. ?Hyperlipidemia: Continue statins ?Stage IIIa CKD: Labile serum creatinine but appears to be close to his baseline.  Creatinine has gone up from 1.41-1.69. ?Dysphagia: As per SLP, dysphagia 2 solids and nectar thickened liquids. ??  COPD: No clinical exacerbation.  Remote smoking history. ?Prior CVA with brain aneurysm: On Plavix ?Suspected vascular dementia: On Aricept. ?Depression & Anxiety: Continue current meds. ?Hypothyroid history: Not on meds.  TSH 0.960. ?Non-anion gap metabolic acidosis: Improved.  Normal, ? ?Body mass index is 19.06 kg/m?. ? ?Nutritional Status ?Nutrition Problem: Moderate Malnutrition ?Etiology: chronic illness (cerebral aneurysm, CAD s/p CABG, CHF, CVA) ?Signs/Symptoms: moderate fat depletion, moderate muscle depletion ?Interventions: MVI, Nepro shake, Refer to RD note for recommendations ? ?Pressure Ulcer: ?  ? ? ?DVT prophylaxis:   Patient on Eliquis, currently held for cardiac cath ?  Code Status: DNR:  ?Family Communication: Sister and brother at bedside this morning ?Disposition:  ?Status is: Inpatient ? ?  ? ? ?Consultants:   ?PCCM ?Cardiology ? ?Procedures:   ?Bilateral diagnostic and therapeutic thoracentesis ?BiPAP ? ?Antimicrobials:   ?As noted above ? ? ?Subjective:  ?Seen this morning while patient was being taken down for cardiac cath.  Reported feeling much better.  Dyspnea significantly improved, now mild and intermittent.  Stated that he was able to lie flat overnight.  No chest pain. ? ?Objective:  ? ?Vitals:  ? 11/01/21 1135  11/01/21 1140 11/01/21 1145 11/01/21 1201  ?BP: (!) 89/42 (!) 91/45 (!) 100/56 (!) 98/57  ?Pulse: 69 68 66 73  ?Resp: (!) 30 14 (!) 30 (!) 23  ?Temp:    97.6 ?F (36.4 ?C)  ?TempSrc:    Oral  ?SpO2: 96% 96% 98% 100%  ?Weight:      ?Height:      ? ? ?General exam: Middle-age male, looks older than stated age, moderately built, frail and cachectic, bitemporal wasting, lying comfortably supine in bed, looking much better than he did yesterday morning. ?Respiratory system: Clear to auscultation except for the bases where slightly diminished breath sounds, bronchial breath sounds and occasional basal crackles. ?Cardiovascular system: S1 & S2 heard, RRR. No JVD, murmurs, rubs, gallops or clicks. No pedal edema.  Telemetry personally reviewed: Sinus rhythm with Manny PVCs.  19 beat NSVT on 5/4 at 10:21 PM. ?Gastrointestinal system: Abdomen is nondistended, soft and nontender. No organomegaly or masses felt. Normal bowel sounds heard. ?Central nervous system: Alert and oriented. No focal neurological deficits. ?Extremities: Symmetric 5 x 5 power. ?Skin: No rashes, lesions or ulcers ?Psychiatry: Judgement and insight impaired. Mood & affect flat. ? ? ? ?Data Reviewed:   ?I have personally reviewed following labs and imaging studies ? ? ?CBC: ?Recent Labs  ?Lab 10/28/21 ?0926 10/30/21 ?0258  ?WBC 8.5 12.0*  ?NEUTROABS 5.8  --   ?HGB 16.0 13.3  ?HCT 48.8 40.5  ?MCV 84.9 83.3  ?PLT 308 237  ? ? ?Basic Metabolic Panel: ?  Recent Labs  ?Lab 10/28/21 ?0926 10/30/21 ?0258 10/31/21 ?2119 11/01/21 ?0133  ?NA 138 137 140 141  ?K 3.6 4.1 4.5 3.1*  ?CL 104 108 114* 108  ?CO2 21* 19* 16* 21*  ?GLUCOSE 146* 107* 105* 105*  ?BUN 18 19 24* 26*  ?CREATININE 1.61* 1.50* 1.41* 1.69*  ?CALCIUM 9.5 9.0 8.3* 8.5*  ?MG  --  1.7  --  1.8  ? ? ?Liver Function Tests: ?Recent Labs  ?Lab 10/28/21 ?0926 10/30/21 ?0258  ?AST 24 23  ?ALT 25 30  ?ALKPHOS 17* 12*  ?BILITOT 1.1 1.5*  ?PROT 7.2 6.0*  ?ALBUMIN 3.4* 3.4*  ? ? ?CBG: ?No results for input(s): GLUCAP  in the last 168 hours. ? ?Microbiology Studies:  ? ?Recent Results (from the past 240 hour(s))  ?Resp Panel by RT-PCR (Flu A&B, Covid) Nasopharyngeal Swab     Status: None  ? Collection Time: 10/28/21 12:05 PM  ?
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
## 2021-11-01 NOTE — Progress Notes (Addendum)
? ?  NAME:  Donald [name], MRN:  [id number], DOB:  1955/09/11, LOS: 4 ?ADMISSION DATE:  10/28/2021, CONSULTATION DATE:  10/29/21 ?REFERRING MD:  TRH, CHIEF COMPLAINT:  SOB  ? ?History of Present Illness:  ?66 year old man with 40 pack year smoking history in distant past, CAD, GERD, CKD, depression, prior strokes who is presenting with variety of issues: ? ?- FTT, unintentional weight loss- months ?- DOE, orthopnea, dry cough- over months, worsening slowly ?- Memory loss, confusion- over years, worsening slowly ?- Anhedonia, depression- more recent due to loss of his longterm girlfriend ?- Dysphagia, chronic ? ?Imaging reveals bilateral large effusions for which PCCM is consulted. ? ?Echo being done looks to have acute on chronic loss of inotropy. ? ?Pertinent  Medical History  ?Distant heavy smoker ?Former Administrator ?GERD ?CAD ??Prior CABG, not in history but has wires for it ? ?Significant Hospital Events: ?Including procedures, antibiotic start and stop dates in addition to other pertinent events   ?5/2 admitted, s/p bilateral thoracentesis ?5/4 overnight required increased O2 requirements and SOB; placed on bipap; hypotensive given IV fluids ? ?Interim History / Subjective:  ? ?No acute events overnight. ?Patient improved yesterday with diuresis ?Scheduled for heart cath today ? ?Objective   ?Blood pressure 121/69, pulse 78, temperature (!) 97.1 ?F (36.2 ?C), temperature source Axillary, resp. rate (!) 29, height '5\' 3"'$  (1.6 m), weight 48.8 kg, SpO2 100 %. ?   ?   ? ?Intake/Output Summary (Last 24 hours) at 11/01/2021 0804 ?Last data filed at 11/01/2021 0300 ?Gross per 24 hour  ?Intake 1978.47 ml  ?Output 1800 ml  ?Net 178.47 ml  ? ?Filed Weights  ? 10/28/21 0908 11/01/21 0612  ?Weight: 58.3 kg 48.8 kg  ? ? ?Examination: ?General: frail man in NAD ?HENT: temporal wasting, MMM ?Lungs: mild bibasilar rales. No wheezing. ?Cardiovascular: RRR, ext warm ?Abdomen: soft, +BS ?Extremities: no edema, + muscle wasting ?Neuro:  Moves all 4 ext to command but weak ?Skin: no rashes ? ? ?Resolved Hospital Problem list   ?N/A ? ?Assessment & Plan:  ?Acute Respiratory Failure w/ hypoxia ?Bilateral pleural effusions- some combination of protein calorie malnutrition, heart failure, and CKD driving s/p bilateral thoracentesis on 5/2 with pleural fluid studies consistent with transudative effusions. Cytology negative for malignancy. ?Aspiration Risk, moderate  ?-Speech evaluation concerning for mild oral dysphagia and MBS showed moderate oropharyngeal dysphagia with anatomical impact marked by multiple and significant cervical fusions and cervical rigidity resulting in laryngeal penetration and aspiration of all consistencies except honey thick. ?Acute on chronic combined systolic/diastolic CHF-Echo shows EF 25-30% ?P: ?-wean O2 for sats >92% ?-bipap prn ?-diuresis per cardiology ?-continue LAMA/LABA nebs. Can discharge with LAMA/LABA inhaler such as stiolto, anoro or bevespi. ?-cytology negative ?-Complete 5 day course of ceftriaxone and azithromycin ?-Pulm toiletry: IS/Flutter ? ?Will schedule follow up in pulmonary clinic for further evaluation of possible obstructive lung disease given his smoking history. PCCM will sign off, please call with any further questions.  ? ?Best Practice (right click and "Reselect all SmartList Selections" daily)  ?Per primary ? ? ?Freda Jackson, MD ?[facility] ?Office: [phone] ? ? ?See Amion for personal pager ?PCCM on call pager [phone] until 7pm. ?Please call El[place] 7p-7a. [phone] ? ? ? ? ? ? ?

## 2021-11-01 NOTE — Progress Notes (Signed)
?   11/01/21 0015  ?Assess: MEWS Score  ?Temp (!) 97.1 ?F (36.2 ?C)  ?BP 113/79  ?Pulse Rate 77  ?ECG Heart Rate 78  ?Resp (!) 33  ?SpO2 99 %  ?O2 Device Nasal Cannula  ?O2 Flow Rate (L/min) 2 L/min  ?Assess: MEWS Score  ?MEWS Temp 0  ?MEWS Systolic 0  ?MEWS Pulse 0  ?MEWS RR 2  ?MEWS LOC 0  ?MEWS Score 2  ?MEWS Score Color Yellow  ?Assess: if the MEWS score is Yellow or Red  ?Were vital signs taken at a resting state? Yes  ?Focused Assessment No change from prior assessment  ?Early Detection of Sepsis Score *See Row Information* Low  ?MEWS guidelines implemented *See Row Information* No, previously yellow, continue vital signs every 4 hours  ?Treat  ?Pain Scale 0-10  ?Pain Score 0  ?Take Vital Signs  ?Increase Vital Sign Frequency  Yellow: Q 2hr X 2 then Q 4hr X 2, if remains yellow, continue Q 4hrs  ?Escalate  ?MEWS: Escalate Yellow: discuss with charge nurse/RN and consider discussing with provider and RRT  ?Notify: Charge Nurse/RN  ?Name of Charge Nurse/RN Notified Neoma Laming, RN  ?Date Charge Nurse/RN Notified 11/01/21  ?Time Charge Nurse/RN Notified 0030  ?Document  ?Patient Outcome Stabilized after interventions  ?Progress note created (see row info) Yes  ? ? ?

## 2021-11-01 NOTE — Progress Notes (Addendum)
Progress note: ?Patient arrived to the catheterization laboratory for his cardiac catheterization.  On arrival he had normal blood pressure.  He does have a flat affect and will just stare up towards the ceiling.  He was placed on the catheterization table.  His baseline O2 saturation was 96%.  And blood pressure was stable.  He was given Versed 1 mg and fentanyl 25 mcg.  Shortly thereafter, the patient had transient hypotension, oxygen saturation decreased 87% and he had a gaze.  He was started on oxygen nonrebreather at 15 L; sternal rub was done and he seemed to awaken and open his eyes.  He was given Narcan was 0.4 mg x2. Initial pupils were pinpoint but then seem to dilate appropriately.  He was able to move all extremities and verbalize.  Oxygen saturation improved to 100%.  Blood pressure stabilized to 130-865 systolically.  The decision was made to not perform the cardiac catheterization procedure today and observe the patient in the holding area for at least 90 minutes.  Dr. Blenda Mounts rounding cardiologist was notified.  Will need to reschedule catheterization at later date. ?[name], MD  ? 11/01/2021  ?10:13 AM  ?

## 2021-11-01 NOTE — Plan of Care (Signed)
  Problem: Activity: Goal: Ability to tolerate increased activity will improve Outcome: Progressing   Problem: Clinical Measurements: Goal: Ability to maintain a body temperature in the normal range will improve Outcome: Progressing   Problem: Respiratory: Goal: Ability to maintain adequate ventilation will improve Outcome: Progressing Goal: Ability to maintain a clear airway will improve Outcome: Progressing   

## 2021-11-01 NOTE — Progress Notes (Signed)
?   11/01/21 0015  ?Assess: MEWS Score  ?Temp (!) 97.1 ?F (36.2 ?C)  ?BP 113/79  ?Pulse Rate 77  ?ECG Heart Rate 78  ?Resp (!) 33  ?SpO2 99 %  ?O2 Device Nasal Cannula  ?O2 Flow Rate (L/min) 2 L/min  ?Assess: MEWS Score  ?MEWS Temp 0  ?MEWS Systolic 0  ?MEWS Pulse 0  ?MEWS RR 2  ?MEWS LOC 0  ?MEWS Score 2  ?MEWS Score Color Yellow  ?Assess: if the MEWS score is Yellow or Red  ?Were vital signs taken at a resting state? Yes  ?Focused Assessment No change from prior assessment  ?Early Detection of Sepsis Score *See Row Information* Low  ?MEWS guidelines implemented *See Row Information* No, previously yellow, continue vital signs every 4 hours  ?Treat  ?Pain Scale 0-10  ?Pain Score 0  ? ? ?

## 2021-11-01 NOTE — Interval H&P Note (Signed)
Cath Lab Visit (complete for each Cath Lab visit) ? ?Clinical Evaluation Leading to the Procedure:  ? ?ACS: No. ? ?Non-ACS:   ? ?Anginal Classification: CCS III ? ?Anti-ischemic medical therapy: Maximal Therapy (2 or more classes of medications) ? ?Non-Invasive Test Results: No non-invasive testing performed ? ?Prior CABG: Previous CABG ? ? ? ? ? ?History and Physical Interval Note: ? ?11/01/2021 ?9:41 AM ? ?Donald [name]  has presented today for surgery, with the diagnosis of nstemi.  The various methods of treatment have been discussed with the patient and family. After consideration of risks, benefits and other options for treatment, the patient has consented to  Procedure(s): ?LEFT HEART CATH AND CORS/GRAFTS ANGIOGRAPHY (N/A) as a surgical intervention.  The patient's history has been reviewed, patient examined, no change in status, stable for surgery.  I have reviewed the patient's chart and labs.  Questions were answered to the patient's satisfaction.   ? ? ?Donald [name] ? ? ?

## 2021-11-01 NOTE — Progress Notes (Signed)
Addendum ? ?RN paged to inform that patient's tip of penis was dusky/bluish discolored.  Came by and evaluated at bedside.  Patient denies pain or any penile complaints.  Tip of penis/glans appears mildly dusky.  As per nursing, this was not noted when he was being cleaned yesterday.  Unclear if he had any topical cleaning prep applied in the Cath Lab i.e. Betadine.  RN advised that the area had been washed despite which the color persisted.  Warm to touch.  Good bilateral femoral pulses. ? ?Etiology of above unclear.  Continue to monitor for now. ? ?Vernell Leep, MD,  ?FACP, [facility], [facility], [facility] (Care Management Physician Certified) ?[facility]ist & Physician Advisor ?[place] ? ?To contact the attending provider between 7A-7P or the covering provider during after hours 7P-7A, please log into the web site [email or web address] and access using universal [place] password for that web site. If you do not have the password, please call the hospital operator. ? ?

## 2021-11-01 NOTE — Progress Notes (Signed)
Mobility Specialist Progress Note ? ? 11/01/21 1725  ?Mobility  ?Activity Contraindicated/medical hold  ? ?Pt inappropriate for mobility specialist at this time given level of complexity, physical assist, and/or precautions as advised by PT team(pt difficult to get up w/ PT because of level of dizziness presented during session). Mobility specialist advised to hold today, will continue to follow for readiness. ? ?Holland Falling ?Mobility Specialist ?Phone Number [phone] ? ?

## 2021-11-01 NOTE — Progress Notes (Addendum)
Physical Therapy Treatment ?Patient Details ?Name: Donald [name] ?MRN: [id number] ?DOB: 04/28/1956 ?Today's Date: 11/01/2021 ? ? ?History of Present Illness Patient is a 66 y/o male who presents on 5/1 with SOB and chest pain as well as URI symptoms. Found to have sepsis secondary to PNA and bil large pleural effusions s/p thoracentesis 5/2. PMH includes dementia, depression, anxiety, CVA, CAD, CHF, MI, suicide attempt. ? ?  ?PT Comments  ? ? Pt is limited by reports of dizziness with 2 attempts at sitting edge of bed, pt also incontinent of stool during session. Pt BP stable throughout session, pt describes symptoms as "I feel like I am spinning". PT notes no nystagmus or reports of symptoms with multiple rolls in bed. Pt will benefit from continued attempts at mobilization in an effort to improve activity tolerance and reduced assistance requirements. Pt demonstrates limited progress to this point and has poor caregiver support at home as his nephew works during the day. PT updating recommendations to SNF.  ?Recommendations for follow up therapy are one component of a multi-disciplinary discharge planning process, led by the attending physician.  Recommendations may be updated based on patient status, additional functional criteria and insurance authorization. ? ?Follow Up Recommendations ? [place] ?  ?  ?Assistance Recommended at Discharge Intermittent Supervision/Assistance  ?Patient can return home with the following A little help with walking and/or transfers;A little help with bathing/dressing/bathroom;Assistance with cooking/housework;Help with stairs or ramp for entrance;Assist for transportation ?  ?Equipment Recommendations ? Rollator (4 wheels) (pending trial next session)  ?  ?Recommendations for Other Services   ? ? ?  ?Precautions / Restrictions Precautions ?Precautions: Fall ?Restrictions ?Weight Bearing Restrictions: No  ?  ? ?Mobility ? Bed Mobility ?Overal bed mobility: Needs  Assistance ?Bed Mobility: Rolling, Sidelying to Sit, Sit to Supine, Supine to Sit ?Rolling: Min assist ?Sidelying to sit: Min guard ?Supine to sit: Min assist ?Sit to supine: Min guard ?  ?General bed mobility comments: assist to pull trunk into sitting or significant verbal cues to sequence sidelying to sit with use of rails. Pt performs sidelying to sit and returns to bed due to dizziness, then supine to sit before returning to supine again due to dizziness. Pt then rolls to assist in hygiene tasks. ?  ? ?Transfers ?Overall transfer level:  (deferred, pt reporting significant dizziness with 2 attempts of sitting at edge of bed. BP stable) ?  ?  ?  ?  ?  ?  ?  ?  ?  ?  ? ?Ambulation/Gait ?  ?  ?  ?  ?  ?  ?  ?  ? ? ?Stairs ?  ?  ?  ?  ?  ? ? ?Wheelchair Mobility ?  ? ?Modified Rankin (Stroke Patients Only) ?  ? ? ?  ?Balance Overall balance assessment: Needs assistance ?Sitting-balance support: No upper extremity supported, Feet supported ?Sitting balance-Leahy Scale: Fair ?  ?  ?  ?  ?  ?  ?  ?  ?  ?  ?  ?  ?  ?  ?  ?  ?  ? ?  ?Cognition Arousal/Alertness: Awake/alert ?Behavior During Therapy: [facility] for tasks assessed/performed ?Overall Cognitive Status: Impaired/Different from baseline ?Area of Impairment: Problem solving, Memory ?  ?  ?  ?  ?  ?  ?  ?  ?  ?  ?Memory: Decreased short-term memory ?  ?  ?  ?Problem Solving: Slow processing ?  ?  ?  ? ?  ?  Exercises   ? ?  ?General Comments General comments (skin integrity, edema, etc.): VSS on RA, BP of 119/71 initially in sitting, 108/72 in supine after reports of dizziness. BP remains in the low 100s/70s in sitting or supine. Pt reports dizziness with both attempts at sitting, does demonstrate some increase in response time with initial bout of dizziness. ?  ?  ? ?Pertinent Vitals/Pain Pain Assessment ?Pain Assessment: No/denies pain  ? ? ?Home Living   ?  ?  ?  ?  ?  ?  ?  ?  ?  ?   ?  ?Prior Function    ?  ?  ?   ? ?PT Goals (current goals can now be found in the  care plan section) Acute Rehab PT Goals ?Patient Stated Goal: to be able to breathe ?Progress towards PT goals: Not progressing toward goals - comment (limited by dizziness) ? ?  ?Frequency ? ? ? Min 3X/week ? ? ? ?  ?PT Plan Current plan remains appropriate  ? ? ?Co-evaluation   ?  ?  ?  ?  ? ?  ?AM-PAC PT "6 Clicks" Mobility   ?Outcome Measure ? Help needed turning from your back to your side while in a flat bed without using bedrails?: A Little ?Help needed moving from lying on your back to sitting on the side of a flat bed without using bedrails?: A Little ?Help needed moving to and from a bed to a chair (including a wheelchair)?: Total ?Help needed standing up from a chair using your arms (e.g., wheelchair or bedside chair)?: Total ?Help needed to walk in hospital room?: Total ?Help needed climbing 3-5 steps with a railing? : Total ?6 Click Score: 10 ? ?  ?End of Session   ?Activity Tolerance: Patient tolerated treatment well ?Patient left: in bed;with call bell/phone within reach;with bed alarm set ?Nurse Communication: Mobility status ?PT Visit Diagnosis: Muscle weakness (generalized) (M62.81);Unsteadiness on feet (R26.81);Other (comment) ?  ? ? ?Time: 6962-9528 ?PT Time Calculation (min) (ACUTE ONLY): 32 min ? ?Charges:  $Therapeutic Activity: 23-37 mins          ?          ? ?Zenaida Niece, PT, DPT ?Acute Rehabilitation ?Pager: [phone] ?Office [phone] ? ? ? ?Zenaida Niece ?11/01/2021, 5:55 PM ? ?

## 2021-11-02 DIAGNOSIS — J9601 Acute respiratory failure with hypoxia: Secondary | ICD-10-CM | POA: Diagnosis not present

## 2021-11-02 DIAGNOSIS — J189 Pneumonia, unspecified organism: Secondary | ICD-10-CM | POA: Diagnosis not present

## 2021-11-02 DIAGNOSIS — I5043 Acute on chronic combined systolic (congestive) and diastolic (congestive) heart failure: Secondary | ICD-10-CM | POA: Diagnosis not present

## 2021-11-02 DIAGNOSIS — I482 Chronic atrial fibrillation, unspecified: Secondary | ICD-10-CM | POA: Diagnosis not present

## 2021-11-02 LAB — QUANTIFERON-TB GOLD PLUS (RQFGPL)
QuantiFERON Mitogen Value: 3.28 IU/mL
QuantiFERON Nil Value: 0 IU/mL
QuantiFERON TB1 Ag Value: 0 IU/mL
QuantiFERON TB2 Ag Value: 0.01 IU/mL

## 2021-11-02 LAB — BASIC METABOLIC PANEL
Anion gap: 11 (ref 5–15)
BUN: 30 mg/dL — ABNORMAL HIGH (ref 8–23)
CO2: 21 mmol/L — ABNORMAL LOW (ref 22–32)
Calcium: 8.7 mg/dL — ABNORMAL LOW (ref 8.9–10.3)
Chloride: 108 mmol/L (ref 98–111)
Creatinine, Ser: 1.57 mg/dL — ABNORMAL HIGH (ref 0.61–1.24)
GFR, Estimated: 48 mL/min — ABNORMAL LOW (ref >60)
Glucose, Bld: 83 mg/dL (ref 70–99)
Potassium: 3.7 mmol/L (ref 3.5–5.1)
Sodium: 140 mmol/L (ref 135–145)

## 2021-11-02 LAB — CULTURE, BLOOD (SINGLE): Culture: NO GROWTH

## 2021-11-02 LAB — VITAMIN B1: Vitamin B1 (Thiamine): 94.1 nmol/L (ref 66.5–200.0)

## 2021-11-02 LAB — MAGNESIUM: Magnesium: 2 mg/dL (ref 1.7–2.4)

## 2021-11-02 LAB — QUANTIFERON-TB GOLD PLUS: QuantiFERON-TB Gold Plus: NEGATIVE

## 2021-11-02 MED ORDER — APIXABAN 2.5 MG PO TABS
2.5000 mg | ORAL_TABLET | Freq: Two times a day (BID) | ORAL | Status: DC
  Administered 2021-11-02 – 2021-11-05 (×6): 2.5 mg via ORAL
  Filled 2021-11-02 (×6): qty 1

## 2021-11-02 MED ORDER — POTASSIUM CHLORIDE CRYS ER 20 MEQ PO TBCR
40.0000 meq | EXTENDED_RELEASE_TABLET | Freq: Once | ORAL | Status: AC
Start: 2021-11-02 — End: 2021-11-02
  Administered 2021-11-02: 40 meq via ORAL
  Filled 2021-11-02: qty 2

## 2021-11-02 NOTE — TOC Initial Note (Signed)
Transition of Care (TOC) - Initial/Assessment Note  ? ? ?Patient Details  ?Name: Donald [name] ?MRN: [id number] ?Date of Birth: January 17, 1956 ? ?Transition of Care (TOC) CM/SW Contact:    ?Reece Agar, LCSWA ?Phone Number: ?11/02/2021, 12:56 PM ? ?Clinical Narrative:                 ?CSW received SNF consult. CSW introduced self and explained role at the hospital.CSW spoke with pt sister about PT recommendation, pt and sister agreeable.  Pt is overall min assist but gets dizzy when ambulating.  ? ?CSW reviewed PT/OT recommendations for SNF. Pt gave CSW permission to fax out to facilities in the area. Pt has no preference of facility at this time. CSW provided pt sister with SNF acceptances, waiting on decision.  ? ?CSW will continue to follow. ?  ? ?Expected Discharge Plan: [place] ?Barriers to Discharge: Continued Medical Work up ? ? ?Patient Goals and CMS Choice ?Patient states their goals for this hospitalization and ongoing recovery are:: Plan for Rehab at the facility ?CMS Medicare.gov Compare Post Acute Care list provided to:: Patient ?Choice offered to / list presented to : Patient, Sibling ? ?Expected Discharge Plan and Services ?Expected Discharge Plan: [place] ?  ?Discharge Planning Services: CM Consult ?Post Acute Care Choice: [place] ?Living arrangements for the past 2 months: [place] ?                ?DME Arranged: N/A ?DME Agency: NA ?  ?  ?  ?HH Arranged: NA ?  ?  ?  ?  ? ?Prior Living Arrangements/Services ?Living arrangements for the past 2 months: [place] ?Lives with:: Relatives (Patient is from home with the nephew.) ?Patient language and need for interpreter reviewed:: Yes ?Do you feel safe going back to the place where you live?: Yes      ?Need for Family Participation in Patient Care: Yes (Comment) ?Care giver support system in place?: No (comment) ?Current home services: DME (Patient has a cane) ?Criminal Activity/Legal  Involvement Pertinent to Current Situation/Hospitalization: No - Comment as needed ? ?Activities of Daily Living ?  ?  ? ?Permission Sought/Granted ?Permission sought to share information with : Family Supports, Customer service manager, Case Manager ?Permission granted to share information with : Yes, Verbal Permission Granted ?   ?   ?   ?   ? ?Emotional Assessment ?Appearance:: Appears stated age ?Attitude/Demeanor/Rapport: Engaged ?Affect (typically observed): Accepting ?Orientation: : Oriented to Self, Oriented to Place, Oriented to  Time, Oriented to Situation ?Alcohol / Substance Use: Not Applicable ?Psych Involvement: No (comment) ? ?Admission diagnosis:  Weakness [R53.1] ?Sepsis due to pneumonia ([place]) [J18.9, A41.9] ?Community acquired pneumonia, unspecified laterality [J18.9] ?Patient Active Problem List  ? Diagnosis Date Noted  ? Acute respiratory failure with hypoxia ([place])   ? Pleural effusion   ? Malnutrition of moderate degree 10/30/2021  ? Community acquired pneumonia 10/28/2021  ? Acute on chronic combined systolic and diastolic CHF (congestive heart failure) ([place]) 10/28/2021  ? Chronic a-fib ([place]) 10/28/2021  ? Hypothyroidism 10/28/2021  ? Chronic kidney disease, stage 3a ([place]) 10/28/2021  ? DNR (do not resuscitate) 10/28/2021  ? Hyperlipidemia 04/15/2021  ? Chest pain 04/13/2021  ? Renal mass 02/25/2018  ? Chest pain of uncertain etiology 16/96/7893  ? Left sided lacunar infarction ([place]) 01/17/2018  ? Vascular dementia with behavior disturbance ([place])   ? MDD (major depressive disorder), single episode, severe , no psychosis ([place])  01/12/2018  ? BPH (benign prostatic hyperplasia) 10/06/2017  ? Constipation 10/06/2017  ? History of stroke 06/11/2017  ? Cerebral ventriculomegaly   ? Altered mental status 06/10/2017  ? Abdominal pain 05/23/2017  ? Left sided abdominal pain 08/06/2016  ? Coronary artery disease 11/23/2014  ? Cerebral aneurysm 11/23/2014  ? ?PCP:  No primary care provider on  file. ?Pharmacy:   ?[facility], [place] ?[place] ?[place] [address] ?Phone: [phone] Fax: [phone] ? ?[place], [place] - [address] ?[place] ?[place] [address] ?Phone: [phone] Fax: [phone] ? ? ? ? ?Social Determinants of Health (SDOH) Interventions ?  ? ?Readmission Risk Interventions ?   ? View : No data to display.  ?  ?  ?  ? ? ? ?

## 2021-11-02 NOTE — Progress Notes (Signed)
Physical Therapy Treatment ?Patient Details ?Name: Donald [name] ?MRN: [id number] ?DOB: 07/29/1955 ?Today's Date: 11/02/2021 ? ? ?History of Present Illness Patient is a 66 y/o male who presents on 5/1 with SOB and chest pain as well as URI symptoms. Found to have sepsis secondary to PNA and bil large pleural effusions s/p thoracentesis 5/2. PMH includes dementia, depression, anxiety, CVA, CAD, CHF, MI, suicide attempt. ? ?  ?PT Comments  ? ? Pt limited by fatigue, tiring after transferring to bedside commode and having bowel movement. Pt continues to requrie physical assistance for all functional mobility and remains at a high falls risk due to both mobility and cognitive deficits. PT provides HEP, which patient demonstrates well, in an effort to improve LE strength and endurance. PT continues to recommend SNF placement.  ?Recommendations for follow up therapy are one component of a multi-disciplinary discharge planning process, led by the attending physician.  Recommendations may be updated based on patient status, additional functional criteria and insurance authorization. ? ?Follow Up Recommendations ? Skilled nursing-short term rehab (<3 hours/day) ?  ?  ?Assistance Recommended at Discharge Intermittent Supervision/Assistance  ?Patient can return home with the following A lot of help with walking and/or transfers;A lot of help with bathing/dressing/bathroom;Assistance with cooking/housework;Direct supervision/assist for medications management;Direct supervision/assist for financial management;Assist for transportation;Help with stairs or ramp for entrance ?  ?Equipment Recommendations ? Rolling walker (2 wheels);BSC/3in1  ?  ?Recommendations for Other Services   ? ? ?  ?Precautions / Restrictions Precautions ?Precautions: Fall ?Restrictions ?Weight Bearing Restrictions: No  ?  ? ?Mobility ? Bed Mobility ?Overal bed mobility: Needs Assistance ?Bed Mobility: Supine to Sit, Sit to Supine ?  ?  ?Supine to sit: Min  assist ?Sit to supine: Min guard ?  ?  ?  ? ?Transfers ?Overall transfer level: Needs assistance ?Equipment used: Rolling walker (2 wheels), 1 person hand held assist ?Transfers: Sit to/from Stand, Bed to chair/wheelchair/BSC ?Sit to Stand: Min assist ?  ?Step pivot transfers: Min assist ?  ?  ?  ?General transfer comment: pt performs initial sit to stand and step pivot with hand hold from PT, 2nd step pivot with RW ?  ? ?Ambulation/Gait ?Ambulation/Gait assistance:  (declines due to fatigue) ?  ?  ?  ?  ?  ?  ?  ? ? ?Stairs ?  ?  ?  ?  ?  ? ? ?Wheelchair Mobility ?  ? ?Modified Rankin (Stroke Patients Only) ?  ? ? ?  ?Balance Overall balance assessment: Needs assistance ?Sitting-balance support: No upper extremity supported, Feet supported ?Sitting balance-Leahy Scale: Fair ?  ?  ?Standing balance support: Single extremity supported, Bilateral upper extremity supported, Reliant on assistive device for balance ?Standing balance-Leahy Scale: Poor ?  ?  ?  ?  ?  ?  ?  ?  ?  ?  ?  ?  ?  ? ?  ?Cognition Arousal/Alertness: Awake/alert ?Behavior During Therapy: [facility] for tasks assessed/performed ?Overall Cognitive Status: Impaired/Different from baseline ?Area of Impairment: Attention, Memory, Following commands, Safety/judgement, Awareness, Problem solving ?  ?  ?  ?  ?  ?  ?  ?  ?  ?Current Attention Level: Sustained ?Memory: Decreased short-term memory ?Following Commands: Follows one step commands consistently, Follows multi-step commands with increased time ?Safety/Judgement: Decreased awareness of deficits ?Awareness: Intellectual ?Problem Solving: Slow processing ?  ?  ?  ? ?  ?Exercises General Exercises - Lower Extremity ?Ankle Circles/Pumps: AROM, Both, 15 reps ?Gluteal Sets: AROM, Both,  15 reps ?Heel Slides: AROM, Both, 15 reps ?Straight Leg Raises: AROM, Both, 15 reps ? ?  ?General Comments General comments (skin integrity, edema, etc.): pt with hypotension upon return to supine, 88/54 (64), BP improves with LE  exercise, pt on 2L [place] throughout session ?  ?  ? ?Pertinent Vitals/Pain Pain Assessment ?Pain Assessment: No/denies pain  ? ? ?Home Living   ?  ?  ?  ?  ?  ?  ?  ?  ?  ?   ?  ?Prior Function    ?  ?  ?   ? ?PT Goals (current goals can now be found in the care plan section) Acute Rehab PT Goals ?Patient Stated Goal: to be able to breathe ?Progress towards PT goals: Not progressing toward goals - comment (poor activity tolerance) ? ?  ?Frequency ? ? ? Min 2X/week ? ? ? ?  ?PT Plan Frequency needs to be updated  ? ? ?Co-evaluation   ?  ?  ?  ?  ? ?  ?AM-PAC PT "6 Clicks" Mobility   ?Outcome Measure ? Help needed turning from your back to your side while in a flat bed without using bedrails?: A Little ?Help needed moving from lying on your back to sitting on the side of a flat bed without using bedrails?: A Little ?Help needed moving to and from a bed to a chair (including a wheelchair)?: A Little ?Help needed standing up from a chair using your arms (e.g., wheelchair or bedside chair)?: A Little ?Help needed to walk in hospital room?: Total ?Help needed climbing 3-5 steps with a railing? : Total ?6 Click Score: 14 ? ?  ?End of Session Equipment Utilized During Treatment: Oxygen ?Activity Tolerance: Patient limited by fatigue ?Patient left: in bed;with call bell/phone within reach;with bed alarm set ?Nurse Communication: Mobility status ?PT Visit Diagnosis: Muscle weakness (generalized) (M62.81);Unsteadiness on feet (R26.81);Other (comment) ?  ? ? ?Time: 4536-4680 ?PT Time Calculation (min) (ACUTE ONLY): 47 min ? ?Charges:  $Therapeutic Exercise: 8-22 mins ?$Therapeutic Activity: 23-37 mins          ?          ? ?Zenaida Niece, PT, DPT ?Acute Rehabilitation ?Pager: [phone] ?Office [phone] ? ? ? ?Zenaida Niece ?11/02/2021, 6:26 PM ? ?

## 2021-11-02 NOTE — Progress Notes (Signed)
Speech Language Pathology Treatment: Dysphagia  ?Patient Details ?Name: Donald [name] ?MRN: [id number] ?DOB: 1955-07-18 ?Today's Date: 11/02/2021 ?Time: 4332-9518 ?SLP Time Calculation (min) (ACUTE ONLY): 20 min ? ?Assessment / Plan / Recommendation ?Clinical Impression ? Patient seen by SLP for skilled treatment session with focus on dysphagia goals and patient education. Patient awake and alert, couple of visitors had just left when SLP entered room. He had a coupld of nectar thickened drinks in front of him. Patient denied any swallowing difficulties when SLP inquired about his toleration of PO's and he proceeded to take cup sips of nectar thick liquids with appearance of timely swallow initiation; No immediate s/s but patient with brief delayed cough response. Patient did not recall swallow test and although SLP showed him some of the MBS fluroscopy videos and explained his current, likely chronic dysphagia, patient did not demonstrate retention or adequate understanding. In addition, patient appears with confusion and poor recall, asking staff member bringing him his lunch tray for a coke when SLP had just returned from bringing him one, telling SLP he had an "egg sandwich" for breakfast (he is on Dys 2, fine chopped solids). Plan to continue to follow patient for dysphagia goals and education. Do not anticipate patient will continue with PO consistency restrictions (Dys 2 solids, nectar thick liquids) without assistance from family/caregiver. ?  ?HPI HPI: Donald [name] is a 66 y.o. male who presented to [facility] ED with URI symptoms. Recent ~30 lb unintentional weight loss. Chest CT 5/1: "There are patchy infiltrates in both upper lobes suggesting  multifocal pneumonia. Moderate to large bilateral pleural effusions  are seen. There are moderate to large infiltrates in both lower  lobes suggesting compression atelectasis and pneumonia." Pt with medical history significant of cerebral aneurysm, on Plavix; C diff  colitis (2015); CAD s/p CABG; afib; hypothyroidism; chronic systolic CHF; and CVA. Pt reported he has had swallow difficulty "since I was a baby." This is first time having pneumonia. ?  ?   ?SLP Plan ? Continue with current plan of care ? ?  ?  ?Recommendations for follow up therapy are one component of a multi-disciplinary discharge planning process, led by the attending physician.  Recommendations may be updated based on patient status, additional functional criteria and insurance authorization. ?  ? ?Recommendations  ?Diet recommendations: Dysphagia 2 (fine chop);Nectar-thick liquid ?Liquids provided via: Cup;No straw ?Medication Administration: Crushed with puree ?Supervision: Patient able to self feed ?Compensations: Slow rate;Small sips/bites;Multiple dry swallows after each bite/sip;Clear throat intermittently ?Postural Changes and/or Swallow Maneuvers: Seated upright 90 degrees;Upright 30-60 min after meal  ?   ?    ?   ? ? ? ? Oral Care Recommendations: Oral care BID ?Follow Up Recommendations: Home health SLP ?Assistance recommended at discharge: Intermittent Supervision/Assistance ?SLP Visit Diagnosis: Dysphagia, oropharyngeal phase (R13.12) ?Plan: Continue with current plan of care ? ? ? ? ?  ?  ?Sonia Baller, MA, CCC-SLP ?Speech Therapy ? ?

## 2021-11-02 NOTE — Progress Notes (Signed)
?PROGRESS NOTE ?  ?Donald [name]  ZOX:[id number]    DOB: 06-Jun-1956    DOA: 10/28/2021 ? ?PCP: No primary care provider on file.  ? ?I have briefly reviewed patients [facility] in [facility]. ? ?Chief Complaint  ?Patient presents with  ? Shortness of Breath  ? Chest Pain  ? ? ?Brief Narrative:  ? 66 year old male, lives with his nephew and independent, medical history significant for CAD s/p CABG x3, chronic systolic CHF with apical aneurysm, HLD, CVA, CKD stage, brain aneurysm, paroxysmal A-fib s/p ILR, remote history of smoking, GERD, anxiety and depression, presented to ED with unintentional weight loss and failure to thrive over several months, progressive dyspnea on exertion, orthopnea and dry cough, progressive worsening memory loss and confusion and chronic dysphagia.  In ED, Tmax of 96 degrees, respiratory rate of 30 and CT chest showed bilateral large pleural effusion and possible multifocal pneumonia.  Admitted for sepsis due to suspected pneumonia, large bilateral pleural effusions.  PCCM consulted and s/p bilateral thoracentesis, fluid transudative, culture and cytology pending.  Worsening cardiomyopathy, cardiology consulted.  Overnight 5/3 developed worsening dyspnea, suspected due to acute pulmonary edema, given IV Lasix and placed on BiPAP.  Clinically much improved. At Cath Lab on 5/5, developed transient hypotension and hypoxia after 1 mg of Versed and 25 mcg of fentanyl and hence cath canceled, plans for outpatient ischemic evaluation. ? ? ?Assessment & Plan:  ?Principal Problem: ?  Community acquired pneumonia ?Active Problems: ?  Coronary artery disease ?  History of stroke ?  Acute on chronic combined systolic and diastolic CHF (congestive heart failure) ([place]) ?  Chronic a-fib ([place]) ?  Vascular dementia with behavior disturbance ([place]) ?  Hyperlipidemia ?  Hypothyroidism ?  Chronic kidney disease, stage 3a ([place]) ?  DNR (do not resuscitate) ?  Malnutrition of moderate  degree ?  Acute respiratory failure with hypoxia ([place]) ?  Pleural effusion ? ? ?Sepsis due to suspected aspiration pneumonia: Met SIRS criteria on admission including hypothermia and tachypnea.  CTA chest showed multifocal pneumonia and bilateral moderate to large pleural effusions.  COVID-19 and flu RT-PCR negative.  Urinary pneumococcal and Legionella antigen negative.  Blood cultures negative to date.  Lactate was up to 2.9.  Procalcitonin negative.  QuantiFERON-TB gold results still pending.  Treated with IV fluids.  Levofloxacin 750 Mg x1 on 5/1 followed by empiric IV ceftriaxone and oral azithromycin since 5/3, complete 5 days course.  Sepsis physiology resolved.  DC'd all antibiotics after 5/6 doses. ?Bilateral moderate to large pleural effusions: PCCM was consulted and underwent bilateral thoracentesis on 5/2 (1.4 L on right and 1.3 L on left).  Fluid transudative in nature.  Cultures negative to date.  Cytology from bilateral pleural fluid: Reactive mesothelial cells.  Improved.  May be related to worsening cardiomyopathy and possible CHF. ?Acute respiratory failure with hypoxia: Overnight 5/3, hypotensive, bolused with IV fluids, then worsening dyspnea, chest x-ray with pulmonary edema, given IV Lasix, transiently required BiPAP.  Clinically much improved.  Now on 2 L/min [place] oxygen, saturating in the upper 90s and should be able to wean off to room air.  PCCM signed off and have arranged outpatient follow-up. ?Acute on chronic systolic CHF: Prior LVEF 81-19%.  Echo this admission showed further reduced EF to 25-30% with wall motion abnormalities.  Cardiology consultation appreciated and has gone for cardiac cath 5/5.  Last dose of Eliquis 5/3 night.  Continue aspirin, statins, Coreg and Imdur.  Overnight decompensation likely related  to IV fluid bolus.  Continue IV Lasix 40 mg twice daily.  Clinically appears euvolemic.  As per cardiology follow-up, continue IV Lasix until renal function remained stable  and can transition to Lasix 40 mg daily once off oxygen and congestion has improved. ?Paroxysmal A-fib s/p ILR: Currently in sinus rhythm.  Eliquis held for cardiac cath, cath canceled and resumed.  Continue beta-blockers. ?NSVT: Likely related to cardiomyopathy.  Continue beta-blockers and consider uptitrating.  Keep K >4 and magnesium >2.  Multiple episodes of NSVT in the last 24 hours. ?CAD s/p CABG: History of intermittent chest pains and dyspnea for few months.  Continue aspirin, statins, beta-blocker and Imdur.  Cardiac cath attempt 5/5 unsuccessful due to hypotension and hypoxia after premeds.  Cardiology planning outpatient ischemic evaluation. ?Hyperlipidemia: Continue statins ?Stage IIIa CKD: Labile serum creatinine but appears to be close to his baseline.  Creatinine has gone up from 1.41-1.69-1.57. ?Dysphagia: As per SLP, dysphagia 2 solids and nectar thickened liquids. ??  COPD: No clinical exacerbation.  Remote smoking history. ?Prior CVA with brain aneurysm: On Plavix ?Suspected vascular dementia: On Aricept. ?Depression & Anxiety: Continue current meds. ?Hypothyroid history: Not on meds.  TSH 0.960. ?Non-anion gap metabolic acidosis: Improved.   ?Dusky discoloration of glans penis: Unclear etiology.  Improving. ? ?Body mass index is 18.82 kg/m?. ? ?Nutritional Status ?Nutrition Problem: Moderate Malnutrition ?Etiology: chronic illness (cerebral aneurysm, CAD s/p CABG, CHF, CVA) ?Signs/Symptoms: moderate fat depletion, moderate muscle depletion ?Interventions: MVI, Nepro shake, Refer to RD note for recommendations ? ? ?DVT prophylaxis:   Patient on Eliquis, currently held for cardiac cath ?  Code Status: DNR:  ?Family Communication: None at bedside. ?Disposition:  ?Status is: Inpatient ? ?  ?Consultants:   ?PCCM ?Cardiology ? ?Procedures:   ?Bilateral diagnostic and therapeutic thoracentesis ?BiPAP ? ?Antimicrobials:   ?As noted above ? ? ?Subjective:  ?Denies complaints.  Dyspnea overall improved.   No penile pain or discomfort.  Asking for his Coke to drink. ? ?Objective:  ? ?Vitals:  ? 11/02/21 0601 11/02/21 0801 11/02/21 0908 11/02/21 1134  ?BP: 109/86 (!) 111/47  116/79  ?Pulse: 75 74    ?Resp: 18 18  16   ?Temp: 97.7 ?F (36.5 ?C)   (!) 96.8 ?F (36 ?C)  ?TempSrc: Oral   Axillary  ?SpO2: 100% 99% 100% 100%  ?Weight: 48.2 kg     ?Height:      ? ? ?General exam: Middle-age male, looks older than stated age, moderately built, frail and cachectic, bitemporal wasting, lying comfortably supine in bed, without distress ?Respiratory system: Occasional basal crackles but otherwise clear to auscultation. ?Cardiovascular system: S1 & S2 heard, RRR. No JVD, murmurs, rubs, gallops or clicks. No pedal edema.  Telemetry personally reviewed: Sinus rhythm, frequent PVCs and multiple episodes of NSVT's. ?Gastrointestinal system: Abdomen is nondistended, soft and nontender. No organomegaly or masses felt. Normal bowel sounds heard. ?Central nervous system: Alert and oriented. No focal neurological deficits. ?Extremities: Symmetric 5 x 5 power. ?Skin: No rashes, lesions or ulcers.  Minimal dusky discoloration of glans penis without any other acute findings, of unclear etiology. ?Psychiatry: Judgement and insight impaired. Mood & affect flat. ? ? ? ?Data Reviewed:   ?I have personally reviewed following labs and imaging studies ? ? ?CBC: ?Recent Labs  ?Lab 10/28/21 ?0926 10/30/21 ?0258  ?WBC 8.5 12.0*  ?NEUTROABS 5.8  --   ?HGB 16.0 13.3  ?HCT 48.8 40.5  ?MCV 84.9 83.3  ?PLT 308 237  ? ? ?Basic Metabolic Panel: ?  Recent Labs  ?Lab 10/28/21 ?0926 10/30/21 ?0258 10/31/21 ?6834 11/01/21 ?0133 11/02/21 ?1962  ?NA 138 137 140 141 140  ?K 3.6 4.1 4.5 3.1* 3.7  ?CL 104 108 114* 108 108  ?CO2 21* 19* 16* 21* 21*  ?GLUCOSE 146* 107* 105* 105* 83  ?BUN 18 19 24* 26* 30*  ?CREATININE 1.61* 1.50* 1.41* 1.69* 1.57*  ?CALCIUM 9.5 9.0 8.3* 8.5* 8.7*  ?MG  --  1.7  --  1.8 2.0  ? ? ?Liver Function Tests: ?Recent Labs  ?Lab 10/28/21 ?0926  10/30/21 ?0258  ?AST 24 23  ?ALT 25 30  ?ALKPHOS 17* 12*  ?BILITOT 1.1 1.5*  ?PROT 7.2 6.0*  ?ALBUMIN 3.4* 3.4*  ? ? ?CBG: ?No results for input(s): GLUCAP in the last 168 hours. ? ?Microbiology Studies:  ? ?Recent Res

## 2021-11-02 NOTE — Progress Notes (Signed)
ANTICOAGULATION CONSULT NOTE - Initial Consult ? ?Pharmacy Consult for apixaban ?Indication: atrial fibrillation ? ?Allergies  ?Allergen Reactions  ? Penicillins Shortness Of Breath and Rash  ?  Has patient had a PCN reaction causing immediate rash, facial/tongue/throat swelling, SOB or lightheadedness with hypotension: Yes ?Has patient had a PCN reaction causing severe rash involving mucus membranes or skin necrosis: No ?Has patient had a PCN reaction that required hospitalization: No ?Has patient had a PCN reaction occurring within the last 10 years: Yes ?If all of the above answers are "NO", then may proceed with Cephalosporin use. ?  ? Sulfa Antibiotics Hives, Shortness Of Breath and Rash  ? Adhesive [Tape] Other (See Comments)  ?  When removed, bad bruises result  ? Tramadol Nausea And Vomiting  ? ? ?Patient Measurements: ?Height: '5\' 3"'$  (160 cm) ?Weight: 48.2 kg (106 lb 4.2 oz) ?IBW/kg (Calculated) : 56.9 ?Heparin Dosing Weight:  ? ?Vital Signs: ?Temp: 96.8 ?F (36 ?C) (05/06 1134) ?Temp Source: Axillary (05/06 1134) ?BP: 116/79 (05/06 1134) ?Pulse Rate: 74 (05/06 0801) ? ?Labs: ?Recent Labs  ?  10/31/21 ?3710 11/01/21 ?0133 11/02/21 ?6269  ?CREATININE 1.41* 1.69* 1.57*  ? ? ?Estimated Creatinine Clearance: 31.6 mL/min (A) (by C-G formula based on SCr of 1.57 mg/dL (H)). ? ? ?Medical History: ?Past Medical History:  ?Diagnosis Date  ? Cataract   ? Cerebral aneurysm without rupture   ? Followed by Dr. Melrose Nakayama, on Plavix  ? CHF (congestive heart failure) ([place])   ? Clostridium difficile colitis 05/2014  ? Presented with sepsis, required stool transplant  ? Coronary artery disease   ? Depression   ? Diverticulosis 11/23/2014  ? GERD (gastroesophageal reflux disease)   ? Hypothyroidism   ? Myocardial infarction [facility])   ? 2009, 2013,2017  ? Stroke [facility])   ? NOV 2015  ? Suicide attempt [facility])   ? ? ?Assessment: ?16 yoM admitted with SOB. Pt on apixaban PTA for hx AF - initially held for cardiac cath, now to resume. Pt's  Cr is 1.57 and wt 48kg, so will utilize 2.'5mg'$  BID dose. ? ?Plan:  ?Apixaban 2.'5mg'$  BID ?Watch Cr ? ?Arrie Senate, PharmD, BCPS, BCCP ?Clinical Pharmacist ?[phone] ?Please check AMION for all [place] numbers ?11/02/2021 ? ? ?

## 2021-11-02 NOTE — Progress Notes (Signed)
? ?Progress Note ? ?Patient Name: Donald [name] ?Date of Encounter: 11/02/2021 ? ?[facility] Cardiologist: Jenkins Rouge, MD  ? ?Subjective  ? ?Stable, on O2. Delayed response to questions but can can converse. He denies chest pain or SOB ? ? ?Inpatient Medications  ?  ?Scheduled Meds: ? arformoterol  15 mcg Nebulization BID  ? aspirin  81 mg Oral Daily  ? atorvastatin  40 mg Oral Daily  ? azithromycin  500 mg Oral Daily  ? carvedilol  6.25 mg Oral BID WC  ? docusate sodium  100 mg Oral BID  ? donepezil  10 mg Oral QHS  ? feeding supplement (NEPRO CARB STEADY)  237 mL Oral BID BM  ? furosemide  40 mg Intravenous BID  ? gabapentin  100 mg Oral Daily  ? isosorbide mononitrate  30 mg Oral Daily  ? mirtazapine  15 mg Oral QHS  ? multivitamin with minerals  1 tablet Oral Daily  ? revefenacin  175 mcg Nebulization Daily  ? sodium chloride flush  3 mL Intravenous Q12H  ? sodium chloride flush  3 mL Intravenous Q12H  ? ?Continuous Infusions: ? cefTRIAXone (ROCEPHIN)  IV 2 g (11/01/21 1359)  ? ?PRN Meds: ?acetaminophen **OR** acetaminophen, albuterol, bisacodyl, food thickener, guaiFENesin, hydrALAZINE, ondansetron **OR** ondansetron (ZOFRAN) IV, oxyCODONE, polyethylene glycol, traZODone  ? ?Vital Signs  ?  ?Vitals:  ? 11/02/21 0028 11/02/21 0601 11/02/21 0801 11/02/21 0908  ?BP: 111/60 109/86 (!) 111/47   ?Pulse: 64 75 74   ?Resp: '16 18 18   '$ ?Temp: (!) 96.2 ?F (35.7 ?C) 97.7 ?F (36.5 ?C)    ?TempSrc: Axillary Oral    ?SpO2: 99% 100% 99% 100%  ?Weight:  48.2 kg    ?Height:      ? ? ?Intake/Output Summary (Last 24 hours) at 11/02/2021 1038 ?Last data filed at 11/02/2021 0900 ?Gross per 24 hour  ?Intake 654.52 ml  ?Output --  ?Net 654.52 ml  ? ? ?  11/02/2021  ?  6:01 AM 11/01/2021  ?  6:12 AM 10/28/2021  ?  9:08 AM  ?Last 3 Weights  ?Weight (lbs) 106 lb 4.2 oz 107 lb 9.4 oz 128 lb 8.5 oz  ?Weight (kg) 48.2 kg 48.8 kg 58.3 kg  ?   ? ?Telemetry  ?  ?SR, ventricular ectopy - Personally Reviewed ? ?ECG  ?  ?None today - Personally  Reviewed ? ?Physical Exam  ? ?Vitals:  ? 11/02/21 0801 11/02/21 0908  ?BP: (!) 111/47   ?Pulse: 74   ?Resp: 18   ?Temp:    ?SpO2: 99% 100%  ? ? ? ?GEN: staring straight ahead, responsive and conversive ?Cardiac: RRR, no significant murmurs, normal S1 and S2 ?Respiratory: nl wob, coarse BS BL ?GI: Soft, nontender, non-distended  ?MS: No edema; No deformity. ?Neuro:  Nonfocal  ?Psych: Normal affect  ? ?Labs  ?  ?ABG ?   ?Component Value Date/Time  ? PHART 7.48 (H) 10/30/2021 2009  ? PCO2ART 23 (L) 10/30/2021 2009  ? PO2ART 95 10/30/2021 2009  ? HCO3 17.5 (L) 10/30/2021 2009  ? ACIDBASEDEF 4.4 (H) 10/30/2021 2009  ? O2SAT 99.5 10/30/2021 2009  ? ? ? ?High Sensitivity Troponin:   ?Recent Labs  ?Lab 10/28/21 ?0926 10/28/21 ?1314  ?TROPONINIHS 49* 41*  ?   ?Chemistry ?Recent Labs  ?Lab 10/28/21 ?0926 10/30/21 ?0258 10/31/21 ?9628 11/01/21 ?0133 11/02/21 ?3662  ?NA 138 137 140 141 140  ?K 3.6 4.1 4.5 3.1* 3.7  ?CL 104 108 114* 108  108  ?CO2 21* 19* 16* 21* 21*  ?GLUCOSE 146* 107* 105* 105* 83  ?BUN 18 19 24* 26* 30*  ?CREATININE 1.61* 1.50* 1.41* 1.69* 1.57*  ?CALCIUM 9.5 9.0 8.3* 8.5* 8.7*  ?MG  --  1.7  --  1.8 2.0  ?PROT 7.2 6.0*  --   --   --   ?ALBUMIN 3.4* 3.4*  --   --   --   ?AST 24 23  --   --   --   ?ALT 25 30  --   --   --   ?ALKPHOS 17* 12*  --   --   --   ?BILITOT 1.1 1.5*  --   --   --   ?GFRNONAA 47* 51* 55* 44* 48*  ?ANIONGAP '13 10 10 12 11  '$ ?  ?Lipids No results for input(s): CHOL, TRIG, HDL, LABVLDL, LDLCALC, CHOLHDL in the last 168 hours.  ?Hematology ?Recent Labs  ?Lab 10/28/21 ?0926 10/30/21 ?0258  ?WBC 8.5 12.0*  ?RBC 5.75 4.86  ?HGB 16.0 13.3  ?HCT 48.8 40.5  ?MCV 84.9 83.3  ?MCH 27.8 27.4  ?MCHC 32.8 32.8  ?RDW 13.6 13.6  ?PLT 308 237  ? ?Thyroid  ?Recent Labs  ?Lab 10/28/21 ?2011  ?TSH 0.960  ?  ?BNP BNP ?   ?Component Value Date/Time  ? BNP 1,371.3 (H) 10/31/2021 0340  ? ?Lab Results  ?Component Value Date  ? HGBA1C 5.7 (H) 01/17/2018  ? ? ? ?Radiology  ?  ?DG Chest Port 1 View ? ?Result Date:  11/01/2021 ?CLINICAL DATA:  Acute respiratory failure with hypoxia. EXAM: PORTABLE CHEST 1 VIEW COMPARISON:  Oct 30, 2021. FINDINGS: Stable cardiomediastinal silhouette. Status post coronary bypass graft. Stable bibasilar atelectasis or edema is noted with associated pleural effusions. Bony thorax is unremarkable. IMPRESSION: Stable bibasilar atelectasis or edema with associated pleural effusions. Electronically Signed   By: Marijo Conception M.D.   On: 11/01/2021 08:18  ? ?ABORTED INVASIVE LAB PROCEDURE ? ?Result Date: 11/01/2021 ?This case was aborted.  ? ?Cardiac Studies  ? ?CARDIAC CATH: 11/01/2021 ? ?ECHO: 10/29/2021 ? 1. Left ventricular ejection fraction, by estimation, is 25 to 30%. The  ?left ventricle has severely decreased function. The left ventricle  ?demonstrates regional wall motion abnormalities (see scoring  ?diagram/findings for description). The left  ?ventricular internal cavity size was moderately dilated. Left ventricular  ?diastolic parameters are indeterminate.  ?LV Wall Scoring: The entire apex and entire inferior wall are akinetic. The antero-lateral wall and posterior wall are hypokinetic. The anterior wall, anterior septum, mid inferoseptal segment, and basal inferoseptal segment are normal.  ? 2. Right ventricular systolic function is mildly reduced. The right  ?ventricular size is normal. Tricuspid regurgitation signal is inadequate  ?for assessing PA pressure.  ? 3. The mitral valve is normal in structure. Mild mitral valve  ?regurgitation.  ? 4. The aortic valve was not well visualized. Aortic valve regurgitation  ?is not visualized. No aortic stenosis is present.  ? ? ?Patient Profile  ?   ?66 y.o. male male with a hx of CAD s/p CABG x3 (LIMA-LAD, SVG-RCA, SVG-OM2)'20, HFrEF with apical aneurysm, HLD, CVA, CKD II, brain aneurysm, paroxsymal Afib s/p ILR was admitted 05/01 with URI sx, +PNA s/p thoracentesis. Echo showed decreased LV function. Underwent LHC that was aborted 2/2 hypoxia and  hypotension with 1 mg of versed s/p narcan.  ? ?Assessment & Plan  ?  ? ?Bilateral pleural effusions c/f PNA ?- s/p thoracentesis w/ 1300 ml  out each lung ?- CXR 05/02 was initially improved, 05/03 w/ progressive CHF changes ?- CXR 05/05 with improvement in CHF, no sig effusions ?- continues to have congestion ? ?Acute on chronic CHF/ Ischemic Heart Disease ; hx of CABG ?- EF was 40%, now 25-30%, attempted LHC however he could not tolerate very low dose sedation. Considering he is stable, will medically manage. Can consider outpatient stress once he is more stable.  ?- continue asa 81 mg daily ?- continue statin ?- cont carvedilol 6.25 mg BID  ?- on Lasix 40 mg IV BID ( can continue as long as renal function remains stable. Can restart lasix 40 mg daily oral once off O2 and congestion has improved) ?- crt is stable. 1.5-1.8 ? ?PNA ?- Mgt per IM ?- on ABX ? ?No changes today. Will follow peripherally and can help schedule outpatient appointment. ? ?For questions or updates, please contact [place] ?Please consult [email or web address] for contact info under  ? ?  ?   ?Signed, ?Janina Mayo, MD  ?11/02/2021, 10:38 AM   ? ?

## 2021-11-02 NOTE — [place] FL2 (Addendum)
?[place] MEDICAID FL2 LEVEL OF CARE SCREENING TOOL  ?  ? ?IDENTIFICATION  ?Patient Name: ?Donald [name] Birthdate: 1955/09/18 Sex: male Admission Date (Current Location): ?10/28/2021  ?[place] and [place] Number: ? [place] ?  Facility and Address:  ?[facility], [place] [address] [place], [place] [address] ?     Provider Number: ?[id number]  ?Attending Physician Name and Address:  ?Modena Jansky, MD ? Relative Name and Phone Number:  ?[name] [phone] ?   ?Current Level of Care: ?Hospital Recommended Level of Care: ?[place] Prior Approval Number: ?  ? ?Date Approved/Denied: ?  PASRR Number: ?[id number] ?Discharge Plan: ?SNF ?  ? ?Current Diagnoses: ?Patient Active Problem List  ? Diagnosis Date Noted  ? Acute respiratory failure with hypoxia ([place])   ? Pleural effusion   ? Malnutrition of moderate degree 10/30/2021  ? Community acquired pneumonia 10/28/2021  ? Acute on chronic combined systolic and diastolic CHF (congestive heart failure) ([place]) 10/28/2021  ? Chronic a-fib ([place]) 10/28/2021  ? Hypothyroidism 10/28/2021  ? Chronic kidney disease, stage 3a ([place]) 10/28/2021  ? DNR (do not resuscitate) 10/28/2021  ? Hyperlipidemia 04/15/2021  ? Chest pain 04/13/2021  ? Renal mass 02/25/2018  ? Chest pain of uncertain etiology 71/24/5809  ? Left sided lacunar infarction ([place]) 01/17/2018  ? Vascular dementia with behavior disturbance ([place])   ? MDD (major depressive disorder), single episode, severe , no psychosis ([place]) 01/12/2018  ? BPH (benign prostatic hyperplasia) 10/06/2017  ? Constipation 10/06/2017  ? History of stroke 06/11/2017  ? Cerebral ventriculomegaly   ? Altered mental status 06/10/2017  ? Abdominal pain 05/23/2017  ? Left sided abdominal pain 08/06/2016  ? Coronary artery disease 11/23/2014  ? Cerebral aneurysm 11/23/2014  ? ? ?Orientation RESPIRATION BLADDER Height & Weight   ?  ?Self, Time, Situation, Place ? O2 (Nasal Cannula 2 liters) Continent Weight:  106 lb 4.2 oz (48.2 kg) ?Height:  '5\' 3"'$  (160 cm)  ?BEHAVIORAL SYMPTOMS/MOOD NEUROLOGICAL BOWEL NUTRITION STATUS  ?    Continent (WDL) Diet (Please see discharge summary)  ?AMBULATORY STATUS COMMUNICATION OF NEEDS Skin   ?Limited Assist Verbally Other (Comment) (WDL) ?  ?  ?  ?    ?     ?     ? ? ?Personal Care Assistance Level of Assistance  ?Bathing, Feeding, Dressing Bathing Assistance: Limited assistance ?  ?Dressing Assistance: Limited assistance ?   ? ?Functional Limitations Info  ?Sight, Hearing, Speech Sight Info: Adequate ?Hearing Info: Adequate ?Speech Info: Adequate  ? ? ?SPECIAL CARE FACTORS FREQUENCY  ?PT (By licensed PT), OT (By licensed OT)   ?  ?PT Frequency: 5x min weekly ?OT Frequency: 5x min weekly ?  ?  ?  ?   ? ? ?Contractures Contractures Info: Not present  ? ? ?Additional Factors Info  ?Code Status, Allergies, Psychotropic Code Status Info: DNR ?Allergies Info: Penicillins,Sulfa Antibiotics,Adhesive (tape),Tramadol ?Psychotropic Info: mirtazapine (REMERON) tablet 15 mg daily at bedtime ?  ?  ?   ? ?Current Medications (11/02/2021):  This is the current hospital active medication list ?Current Facility-Administered Medications  ?Medication Dose Route Frequency Provider Last Rate Last Admin  ? acetaminophen (TYLENOL) tablet 650 mg  650 mg Oral Q6H PRN Karmen Bongo, MD      ? Or  ? acetaminophen (TYLENOL) suppository 650 mg  650 mg Rectal Q6H PRN Karmen Bongo, MD      ? albuterol (PROVENTIL) (2.5 MG/3ML) 0.083% nebulizer solution 2.5 mg  2.5 mg  Nebulization Q2H PRN Karmen Bongo, MD      ? arformoterol [facility]) nebulizer solution 15 mcg  15 mcg Nebulization BID Freddi Starr, MD   15 mcg at 11/02/21 2355  ? aspirin chewable tablet 81 mg  81 mg Oral Daily Karmen Bongo, MD   81 mg at 11/02/21 7322  ? atorvastatin (LIPITOR) tablet 40 mg  40 mg Oral Daily Karmen Bongo, MD   40 mg at 11/02/21 0254  ? azithromycin (ZITHROMAX) tablet 500 mg  500 mg Oral Daily Donnamae Jude, RPH   500 mg  at 11/02/21 2706  ? bisacodyl (DULCOLAX) EC tablet 5 mg  5 mg Oral Daily PRN Karmen Bongo, MD      ? carvedilol (COREG) tablet 6.25 mg  6.25 mg Oral BID WC Karmen Bongo, MD   6.25 mg at 11/02/21 2376  ? cefTRIAXone (ROCEPHIN) 2 g in sodium chloride 0.9 % 100 mL IVPB  2 g Intravenous Q24H Nita Sells, MD 200 mL/hr at 11/01/21 1359 2 g at 11/01/21 1359  ? docusate sodium (COLACE) capsule 100 mg  100 mg Oral BID Karmen Bongo, MD   100 mg at 10/31/21 2118  ? donepezil (ARICEPT) tablet 10 mg  10 mg Oral QHS Nita Sells, MD   10 mg at 11/01/21 2108  ? feeding supplement (NEPRO CARB STEADY) liquid 237 mL  237 mL Oral BID BM Modena Jansky, MD   237 mL at 11/02/21 1138  ? food thickener (SIMPLYTHICK (NECTAR/LEVEL 2/MILDLY THICK)) 1 packet  1 packet Oral PRN Donnamae Jude, RPH      ? furosemide (LASIX) injection 40 mg  40 mg Intravenous BID Skeet Latch, MD   40 mg at 11/02/21 2831  ? gabapentin (NEURONTIN) capsule 100 mg  100 mg Oral Daily Karmen Bongo, MD   100 mg at 11/02/21 5176  ? guaiFENesin (MUCINEX) 12 hr tablet 600 mg  600 mg Oral BID PRN Karmen Bongo, MD   600 mg at 10/31/21 2119  ? hydrALAZINE (APRESOLINE) injection 5 mg  5 mg Intravenous Q4H PRN Karmen Bongo, MD      ? isosorbide mononitrate (IMDUR) 24 hr tablet 30 mg  30 mg Oral Daily Karmen Bongo, MD   30 mg at 11/02/21 1607  ? mirtazapine (REMERON) tablet 15 mg  15 mg Oral QHS Nita Sells, MD   15 mg at 11/01/21 2108  ? multivitamin with minerals tablet 1 tablet  1 tablet Oral Daily Modena Jansky, MD   1 tablet at 11/02/21 3710  ? ondansetron (ZOFRAN) tablet 4 mg  4 mg Oral Q6H PRN Karmen Bongo, MD      ? Or  ? ondansetron [facility]) injection 4 mg  4 mg Intravenous Q6H PRN Karmen Bongo, MD      ? oxyCODONE (Oxy IR/ROXICODONE) immediate release tablet 5 mg  5 mg Oral Q4H PRN Karmen Bongo, MD   5 mg at 10/29/21 0846  ? polyethylene glycol (MIRALAX / GLYCOLAX) packet 17 g  17 g Oral Daily PRN  Karmen Bongo, MD      ? revefenacin [facility]) nebulizer solution 175 mcg  175 mcg Nebulization Daily Freddi Starr, MD   175 mcg at 11/02/21 6269  ? sodium chloride flush (NS) 0.9 % injection 3 mL  3 mL Intravenous Q12H Karmen Bongo, MD   3 mL at 10/31/21 2120  ? sodium chloride flush (NS) 0.9 % injection 3 mL  3 mL Intravenous Q12H Reino Bellis B, NP   3 mL  at 11/02/21 0939  ? traZODone (DESYREL) tablet 50 mg  50 mg Oral QHS PRN Karmen Bongo, MD   50 mg at 10/31/21 2119  ? ? ? ?Discharge Medications: ?Please see discharge summary for a list of discharge medications. ? ?Relevant Imaging Results: ? ?Relevant Lab Results: ? ? ?Additional Information ?[phone] ? ?Emeterio Reeve, LCSW ? ? ? ? ?

## 2021-11-02 NOTE — [place] Notes (Signed)
Pt had 8 runs of VTach. Pt is asymptomatic. Will continue to monitor pt.  ?

## 2021-11-03 ENCOUNTER — Other Ambulatory Visit: Payer: Self-pay | Admitting: Physician Assistant

## 2021-11-03 DIAGNOSIS — J189 Pneumonia, unspecified organism: Secondary | ICD-10-CM | POA: Diagnosis not present

## 2021-11-03 DIAGNOSIS — I5043 Acute on chronic combined systolic (congestive) and diastolic (congestive) heart failure: Secondary | ICD-10-CM

## 2021-11-03 DIAGNOSIS — J9601 Acute respiratory failure with hypoxia: Secondary | ICD-10-CM | POA: Diagnosis not present

## 2021-11-03 DIAGNOSIS — N1831 Chronic kidney disease, stage 3a: Secondary | ICD-10-CM | POA: Diagnosis not present

## 2021-11-03 DIAGNOSIS — I482 Chronic atrial fibrillation, unspecified: Secondary | ICD-10-CM | POA: Diagnosis not present

## 2021-11-03 LAB — VITAMIN B6: Vitamin B6: 9.8 ug/L (ref 3.4–65.2)

## 2021-11-03 LAB — BASIC METABOLIC PANEL
Anion gap: 13 (ref 5–15)
BUN: 33 mg/dL — ABNORMAL HIGH (ref 8–23)
CO2: 25 mmol/L (ref 22–32)
Calcium: 9 mg/dL (ref 8.9–10.3)
Chloride: 103 mmol/L (ref 98–111)
Creatinine, Ser: 1.72 mg/dL — ABNORMAL HIGH (ref 0.61–1.24)
GFR, Estimated: 43 mL/min — ABNORMAL LOW (ref >60)
Glucose, Bld: 98 mg/dL (ref 70–99)
Potassium: 3.8 mmol/L (ref 3.5–5.1)
Sodium: 141 mmol/L (ref 135–145)

## 2021-11-03 MED ORDER — POTASSIUM CHLORIDE 20 MEQ PO PACK
40.0000 meq | PACK | Freq: Once | ORAL | Status: AC
  Administered 2021-11-03: 40 meq via ORAL
  Filled 2021-11-03: qty 2

## 2021-11-03 MED ORDER — FUROSEMIDE 40 MG PO TABS
40.0000 mg | ORAL_TABLET | Freq: Every day | ORAL | Status: DC
  Administered 2021-11-04 – 2021-11-05 (×2): 40 mg via ORAL
  Filled 2021-11-03 (×2): qty 1

## 2021-11-03 MED ORDER — FOOD THICKENER (THICKENUP CLEAR): ORAL | Status: DC | PRN

## 2021-11-03 NOTE — Progress Notes (Signed)
?PROGRESS NOTE ?  ?Donald [name]  QZR:[id number]    DOB: February 04, 1956    DOA: 10/28/2021 ? ?PCP: No primary care provider on file.  ? ?I have briefly reviewed patients previous medical records in [facility]. ? ?Chief Complaint  ?Patient presents with  ? Shortness of Breath  ? Chest Pain  ? ? ?Brief Narrative:  ? 66 year old male, lives with his nephew and independent, medical history significant for CAD s/p CABG x3, chronic systolic CHF with apical aneurysm, HLD, CVA, CKD stage, brain aneurysm, paroxysmal A-fib s/p ILR, remote history of smoking, GERD, anxiety and depression, presented to ED with unintentional weight loss and failure to thrive over several months, progressive dyspnea on exertion, orthopnea and dry cough, progressive worsening memory loss and confusion and chronic dysphagia.  In ED, Tmax of 96 degrees, respiratory rate of 30 and CT chest showed bilateral large pleural effusion and possible multifocal pneumonia.  Admitted for sepsis due to suspected pneumonia, large bilateral pleural effusions.  PCCM consulted and s/p bilateral thoracentesis, fluid transudative, culture and cytology pending.  Worsening cardiomyopathy, cardiology consulted.  Overnight 5/3 developed worsening dyspnea, suspected due to acute pulmonary edema, given IV Lasix and placed on BiPAP.  Clinically much improved. At Cath Lab on 5/5, developed transient hypotension and hypoxia after 1 mg of Versed and 25 mcg of fentanyl and hence cath canceled, plans for outpatient ischemic evaluation. ? ? ?Assessment & Plan:  ?Principal Problem: ?  Community acquired pneumonia ?Active Problems: ?  Coronary artery disease ?  History of stroke ?  Acute on chronic combined systolic and diastolic CHF (congestive heart failure) ([place]) ?  Chronic a-fib ([place]) ?  Vascular dementia with behavior disturbance ([place]) ?  Hyperlipidemia ?  Hypothyroidism ?  Chronic kidney disease, stage 3a ([place]) ?  DNR (do not resuscitate) ?  Malnutrition of moderate  degree ?  Acute respiratory failure with hypoxia ([place]) ?  Pleural effusion ? ? ?Sepsis due to suspected aspiration pneumonia: Met SIRS criteria on admission including hypothermia and tachypnea.  CTA chest showed multifocal pneumonia and bilateral moderate to large pleural effusions.  COVID-19 and flu RT-PCR negative.  Urinary pneumococcal and Legionella antigen negative.  Blood cultures negative to date.  Lactate was up to 2.9.  Procalcitonin negative.  QuantiFERON-TB gold results still pending.  Treated with IV fluids.  Levofloxacin 750 Mg x1 on 5/1 followed by empiric IV ceftriaxone and oral azithromycin since 5/3, complete 5 days course.  Sepsis physiology resolved.  DC'd all antibiotics after 5/6 doses. ?Bilateral moderate to large pleural effusions: PCCM was consulted and underwent bilateral thoracentesis on 5/2 (1.4 L on right and 1.3 L on left).  Fluid transudative in nature.  Cultures negative to date.  Cytology from bilateral pleural fluid: Reactive mesothelial cells.  Improved.  May be related to worsening cardiomyopathy and possible CHF. ?Acute respiratory failure with hypoxia: Overnight 5/3, hypotensive, bolused with IV fluids, then worsening dyspnea, chest x-ray with pulmonary edema, given IV Lasix, transiently required BiPAP.  Clinically much improved.  Weaned off to room air.  PCCM signed off and have arranged outpatient follow-up. ?Acute on chronic systolic CHF: Prior LVEF 35-45%.  Echo this admission showed further reduced EF to 25-30% with wall motion abnormalities.  Cardiology consultation appreciated and has gone for cardiac cath 5/5.  Last dose of Eliquis 5/3 night.  Continue aspirin, statins, Coreg and Imdur.  Overnight decompensation likely related to IV fluid bolus.  Diuresed for several days with IV Lasix 40 mg twice daily.  Clinically euvolemic.  Creatinine with slight uptake.  Cardiology has transitioned Lasix to p.o. ?Paroxysmal A-fib s/p ILR: Currently in sinus rhythm.  Eliquis held for  cardiac cath, cath canceled and resumed.  Continue beta-blockers. ?NSVT: Likely related to cardiomyopathy.  Continue beta-blockers and up titration limited by soft BPs.  Keep K >4 and magnesium >2.  Still with some NSVT's and PVCs but less. ?CAD s/p CABG: History of intermittent chest pains and dyspnea for few months.  Continue aspirin, statins, beta-blocker and Imdur.  Cardiac cath attempt 5/5 unsuccessful due to hypotension and hypoxia after premeds.  Cardiology arranging for outpatient stress test. ?Hyperlipidemia: Continue statins ?Stage IIIa CKD: Labile serum creatinine but appears to be close to his baseline.  Creatinine has gone up from 1.41-1.69-1.57-1.72. ?Dysphagia: As per SLP, dysphagia 2 solids and nectar thickened liquids. ??  COPD: No clinical exacerbation.  Remote smoking history. ?Prior CVA with brain aneurysm: On Plavix ?Suspected vascular dementia: On Aricept. ?Depression & Anxiety: Continue current meds. ?Hypothyroid history: Not on meds.  TSH 0.960. ?Non-anion gap metabolic acidosis: Improved.   ?Dusky discoloration of glans penis: Unclear etiology.  Improving. ? ?Body mass index is 18.63 kg/m?. ? ?Nutritional Status ?Nutrition Problem: Moderate Malnutrition ?Etiology: chronic illness (cerebral aneurysm, CAD s/p CABG, CHF, CVA) ?Signs/Symptoms: moderate fat depletion, moderate muscle depletion ?Interventions: MVI, Nepro shake, Refer to RD note for recommendations ? ? ?DVT prophylaxis: apixaban (ELIQUIS) tablet 2.5 mg Start: 11/02/21 1615   ?  Code Status: DNR:  ?Family Communication: Sister at bedside. ?Disposition:  ?Status is: Inpatient ? ?Should be medically optimized for DC to SNF 5/8. ?  ?Consultants:   ?PCCM ?Cardiology ? ?Procedures:   ?Bilateral diagnostic and therapeutic thoracentesis ?BiPAP ? ?Antimicrobials:   ?As noted above ? ? ?Subjective:  ?No complaints reported.  No dyspnea.  As per sister at bedside, patient looks much better than he did on admission. ? ?Objective:  ? ?Vitals:   ? 11/03/21 0440 11/03/21 0715 11/03/21 0728 11/03/21 1059  ?BP: (!) 115/58 97/65  (!) 93/53  ?Pulse: (!) 35 67  64  ?Resp: 20 (!) 23  (!) 23  ?Temp: (!) 97 ?F (36.1 ?C) (!) 96.8 ?F (36 ?C)  (!) 96.1 ?F (35.6 ?C)  ?TempSrc: Axillary Axillary  Axillary  ?SpO2: 100% 99% 98% 99%  ?Weight: 47.7 kg     ?Height:      ? ? ?General exam: Middle-age male, looks older than stated age, moderately built, frail and cachectic, bitemporal wasting, lying comfortably supine in bed, without distress ?Respiratory system: Clear to auscultation. ?Cardiovascular system: S1 & S2 heard, RRR. No JVD, murmurs, rubs, gallops or clicks. No pedal edema.  Telemetry personally reviewed: Sinus rhythm, few PVCs and few short NSVT's but better compared to prior. ?Gastrointestinal system: Abdomen is nondistended, soft and nontender. No organomegaly or masses felt. Normal bowel sounds heard. ?Central nervous system: Alert and oriented. No focal neurological deficits. ?Extremities: Symmetric 5 x 5 power. ?Skin: No rashes, lesions or ulcers.  Minimal dusky discoloration of glans penis without any other acute findings, of unclear etiology. ?Psychiatry: Judgement and insight impaired. Mood & affect flat. ? ? ? ?Data Reviewed:   ?I have personally reviewed following labs and imaging studies ? ? ?CBC: ?Recent Labs  ?Lab 10/28/21 ?0926 10/30/21 ?0258  ?WBC 8.5 12.0*  ?NEUTROABS 5.8  --   ?HGB 16.0 13.3  ?HCT 48.8 40.5  ?MCV 84.9 83.3  ?PLT 308 237  ? ? ?Basic Metabolic Panel: ?Recent Labs  ?Lab 10/30/21 ?0258 10/31/21 ?  9485 11/01/21 ?0133 11/02/21 ?4627 11/03/21 ?0350  ?NA 137 140 141 140 141  ?K 4.1 4.5 3.1* 3.7 3.8  ?CL 108 114* 108 108 103  ?CO2 19* 16* 21* 21* 25  ?GLUCOSE 107* 105* 105* 83 98  ?BUN 19 24* 26* 30* 33*  ?CREATININE 1.50* 1.41* 1.69* 1.57* 1.72*  ?CALCIUM 9.0 8.3* 8.5* 8.7* 9.0  ?MG 1.7  --  1.8 2.0  --   ? ? ?Liver Function Tests: ?Recent Labs  ?Lab 10/28/21 ?0926 10/30/21 ?0258  ?AST 24 23  ?ALT 25 30  ?ALKPHOS 17* 12*  ?BILITOT 1.1 1.5*   ?PROT 7.2 6.0*  ?ALBUMIN 3.4* 3.4*  ? ? ?CBG: ?No results for input(s): GLUCAP in the last 168 hours. ? ?Microbiology Studies:  ? ?Recent Results (from the past 240 hour(s))  ?Resp Panel by RT-PCR (Flu A&B,

## 2021-11-03 NOTE — Progress Notes (Addendum)
? ?Progress Note ? ?Patient Name: Donald [name] ?Date of Encounter: 11/03/2021 ? ?[facility] Cardiologist: Jenkins Rouge, MD  ? ?Subjective  ? ?Stable, on O2. Satting well. Asymptomatic His sister is by the bedside ? ? ?Inpatient Medications  ?  ?Scheduled Meds: ? apixaban  2.5 mg Oral BID  ? arformoterol  15 mcg Nebulization BID  ? atorvastatin  40 mg Oral Daily  ? carvedilol  6.25 mg Oral BID WC  ? docusate sodium  100 mg Oral BID  ? donepezil  10 mg Oral QHS  ? feeding supplement (NEPRO CARB STEADY)  237 mL Oral BID BM  ? furosemide  40 mg Intravenous BID  ? gabapentin  100 mg Oral Daily  ? isosorbide mononitrate  30 mg Oral Daily  ? mirtazapine  15 mg Oral QHS  ? multivitamin with minerals  1 tablet Oral Daily  ? revefenacin  175 mcg Nebulization Daily  ? sodium chloride flush  3 mL Intravenous Q12H  ? sodium chloride flush  3 mL Intravenous Q12H  ? ?Continuous Infusions: ? ? ?PRN Meds: ?acetaminophen **OR** acetaminophen, albuterol, bisacodyl, food thickener, guaiFENesin, hydrALAZINE, ondansetron **OR** ondansetron (ZOFRAN) IV, oxyCODONE, polyethylene glycol, traZODone  ? ?Vital Signs  ?  ?Vitals:  ? 11/03/21 0000 11/03/21 0440 11/03/21 0715 11/03/21 0728  ?BP:  (!) 115/58 97/65   ?Pulse:  (!) 35 67   ?Resp: 18 20 (!) 23   ?Temp:  (!) 97 ?F (36.1 ?C) (!) 96.8 ?F (36 ?C)   ?TempSrc:  Axillary Axillary   ?SpO2:  100% 99% 98%  ?Weight:  47.7 kg    ?Height:      ? ? ?Intake/Output Summary (Last 24 hours) at 11/03/2021 0959 ?Last data filed at 11/03/2021 8676 ?Gross per 24 hour  ?Intake 118 ml  ?Output 1350 ml  ?Net -1232 ml  ? ? ?  11/03/2021  ?  4:40 AM 11/02/2021  ?  6:01 AM 11/01/2021  ?  6:12 AM  ?Last 3 Weights  ?Weight (lbs) 105 lb 2.6 oz 106 lb 4.2 oz 107 lb 9.4 oz  ?Weight (kg) 47.7 kg 48.2 kg 48.8 kg  ?   ? ?Telemetry  ?  ?SR, ventricular ectopy - Personally Reviewed ? ?ECG  ?  ?None today - Personally Reviewed ? ?Physical Exam  ? ?Vitals:  ? 11/03/21 0715 11/03/21 0728  ?BP: 97/65   ?Pulse: 67   ?Resp: (!) 23    ?Temp: (!) 96.8 ?F (36 ?C)   ?SpO2: 99% 98%  ? ? ? ?GEN: staring straight ahead, responsive and conversive ?Cardiac: RRR, no significant murmurs, normal S1 and S2 ?Respiratory: nl wob, coarse BS BL ?GI: Soft, nontender, non-distended  ?MS: No edema; No deformity. ?Neuro:  Nonfocal  ?Psych: Normal affect  ? ?Labs  ?  ?ABG ?   ?Component Value Date/Time  ? PHART 7.48 (H) 10/30/2021 2009  ? PCO2ART 23 (L) 10/30/2021 2009  ? PO2ART 95 10/30/2021 2009  ? HCO3 17.5 (L) 10/30/2021 2009  ? ACIDBASEDEF 4.4 (H) 10/30/2021 2009  ? O2SAT 99.5 10/30/2021 2009  ? ? ? ?High Sensitivity Troponin:   ?Recent Labs  ?Lab 10/28/21 ?0926 10/28/21 ?1314  ?TROPONINIHS 49* 41*  ?   ?Chemistry ?Recent Labs  ?Lab 10/28/21 ?0926 10/30/21 ?0258 10/31/21 ?7209 11/01/21 ?0133 11/02/21 ?4709 11/03/21 ?6283  ?NA 138 137   < > 141 140 141  ?K 3.6 4.1   < > 3.1* 3.7 3.8  ?CL 104 108   < > 108 108  103  ?CO2 21* 19*   < > 21* 21* 25  ?GLUCOSE 146* 107*   < > 105* 83 98  ?BUN 18 19   < > 26* 30* 33*  ?CREATININE 1.61* 1.50*   < > 1.69* 1.57* 1.72*  ?CALCIUM 9.5 9.0   < > 8.5* 8.7* 9.0  ?MG  --  1.7  --  1.8 2.0  --   ?PROT 7.2 6.0*  --   --   --   --   ?ALBUMIN 3.4* 3.4*  --   --   --   --   ?AST 24 23  --   --   --   --   ?ALT 25 30  --   --   --   --   ?ALKPHOS 17* 12*  --   --   --   --   ?BILITOT 1.1 1.5*  --   --   --   --   ?GFRNONAA 47* 51*   < > 44* 48* 43*  ?ANIONGAP 13 10   < > '12 11 13  '$ ? < > = values in this interval not displayed.  ?  ?Lipids No results for input(s): CHOL, TRIG, HDL, LABVLDL, LDLCALC, CHOLHDL in the last 168 hours.  ?Hematology ?Recent Labs  ?Lab 10/28/21 ?0926 10/30/21 ?0258  ?WBC 8.5 12.0*  ?RBC 5.75 4.86  ?HGB 16.0 13.3  ?HCT 48.8 40.5  ?MCV 84.9 83.3  ?MCH 27.8 27.4  ?MCHC 32.8 32.8  ?RDW 13.6 13.6  ?PLT 308 237  ? ?Thyroid  ?Recent Labs  ?Lab 10/28/21 ?2011  ?TSH 0.960  ?  ?BNP BNP ?   ?Component Value Date/Time  ? BNP 1,371.3 (H) 10/31/2021 0340  ? ?Lab Results  ?Component Value Date  ? HGBA1C 5.7 (H) 01/17/2018   ? ? ? ?Radiology  ?  ?No results found. ? ?Cardiac Studies  ? ?CARDIAC CATH: 11/01/2021 ? ?ECHO: 10/29/2021 ? 1. Left ventricular ejection fraction, by estimation, is 25 to 30%. The  ?left ventricle has severely decreased function. The left ventricle  ?demonstrates regional wall motion abnormalities (see scoring  ?diagram/findings for description). The left  ?ventricular internal cavity size was moderately dilated. Left ventricular  ?diastolic parameters are indeterminate.  ?LV Wall Scoring: The entire apex and entire inferior wall are akinetic. The antero-lateral wall and posterior wall are hypokinetic. The anterior wall, anterior septum, mid inferoseptal segment, and basal inferoseptal segment are normal.  ? 2. Right ventricular systolic function is mildly reduced. The right  ?ventricular size is normal. Tricuspid regurgitation signal is inadequate  ?for assessing PA pressure.  ? 3. The mitral valve is normal in structure. Mild mitral valve  ?regurgitation.  ? 4. The aortic valve was not well visualized. Aortic valve regurgitation  ?is not visualized. No aortic stenosis is present.  ? ? ?Patient Profile  ?   ?66 y.o. male male with a hx of CAD s/p CABG x3 (LIMA-LAD, SVG-RCA, SVG-OM2)'20, HFrEF with apical aneurysm with no LV thrombus, HLD, CVA, CKD II, brain aneurysm, paroxsymal Afib s/p ILR was admitted 05/01 with URI sx, +PNA s/p thoracentesis. Echo showed decreased LV function. Underwent LHC that was aborted 2/2 hypoxia and hypotension with 1 mg of versed s/p narcan.  ? ?Assessment & Plan  ?  ? ?Bilateral pleural effusions c/f PNA ?- s/p thoracentesis w/ 1300 ml out each lung ?- CXR 05/02 was initially improved, 05/03 w/ progressive CHF changes ?- CXR 05/05 with improvement in CHF, no sig effusions ?-  continues to have congestion ? ?Acute on chronic CHF/ Ischemic Heart Disease ; hx of CABG ?- EF was 40%, now 25-30%, attempted LHC however he could not tolerate very low dose sedation. Considering he is stable,  will medically manage. Can plan for  outpatient stress ?- continue asa 81 mg daily ?- continue statin ?- cont carvedilol 6.25 mg BID  ?- was on  Lasix 40 mg IV BID for a few days. Crt mild increase. Clinically improved. Will transition to oral lasix today ?- crt is stable. 1.5-1.8 ? ?Discussed plans for stress test. The benefits (risk stratification, diagnosing coronary artery disease, treatment guidance) and risks [chest pain, dyspnea, cardiac arrhythmias, dizziness, low blood pressure, allergic reaction, heart attack, and life-threatening complications (estimated to be 1 in 10,000)] were discussed in detail with Donald [name] and he agrees to proceed. ? ? ? ?3. Ventricular Ectopy. Has some ventricular ectopy. Asymptomatic. Would like to uptitrate his BB; however Bps soft. K>4. Mg>2. ? ?4. Afib-in sinus rhythm ?-continue eliquis and BB ? ?5. PNA ?- Mgt per IM ?- on ABX ? ?Gave some potassium and changed to oral lasix. Will arrange for outpatient stress test and follow up today.  ? ?For questions or updates, please contact [place] ?Please consult [email or web address] for contact info under  ? ?  ?   ?Signed, ?Janina Mayo, MD  ?11/03/2021, 9:59 AM   ? ?

## 2021-11-03 NOTE — Progress Notes (Signed)
OP Myoview ordered. ?Message sent to office to arrange f/u with APP or Dr. Sallyanne Kuster after Myoview. ?Richardson Dopp, PA-C    ?11/03/2021 10:48 AM   ?

## 2021-11-03 NOTE — Progress Notes (Signed)
Pt in no distress requiring bipap at this time.  Pt has not worn since 5/4.  RT will cont to monitor. ?

## 2021-11-04 DIAGNOSIS — J9601 Acute respiratory failure with hypoxia: Secondary | ICD-10-CM | POA: Diagnosis not present

## 2021-11-04 DIAGNOSIS — J189 Pneumonia, unspecified organism: Secondary | ICD-10-CM | POA: Diagnosis not present

## 2021-11-04 LAB — BASIC METABOLIC PANEL
Anion gap: 12 (ref 5–15)
BUN: 31 mg/dL — ABNORMAL HIGH (ref 8–23)
CO2: 24 mmol/L (ref 22–32)
Calcium: 9 mg/dL (ref 8.9–10.3)
Chloride: 102 mmol/L (ref 98–111)
Creatinine, Ser: 1.62 mg/dL — ABNORMAL HIGH (ref 0.61–1.24)
GFR, Estimated: 47 mL/min — ABNORMAL LOW (ref >60)
Glucose, Bld: 88 mg/dL (ref 70–99)
Potassium: 3.6 mmol/L (ref 3.5–5.1)
Sodium: 138 mmol/L (ref 135–145)

## 2021-11-04 LAB — MAGNESIUM: Magnesium: 1.9 mg/dL (ref 1.7–2.4)

## 2021-11-04 MED ORDER — MAGNESIUM SULFATE 2 GM/50ML IV SOLN
2.0000 g | Freq: Once | INTRAVENOUS | Status: AC
  Administered 2021-11-04: 2 g via INTRAVENOUS
  Filled 2021-11-04: qty 50

## 2021-11-04 MED ORDER — POTASSIUM CHLORIDE 20 MEQ PO PACK
40.0000 meq | PACK | Freq: Once | ORAL | Status: AC
  Administered 2021-11-04: 40 meq via ORAL
  Filled 2021-11-04: qty 2

## 2021-11-04 MED FILL — Naloxone HCl Inj 0.4 MG/ML: INTRAMUSCULAR | Qty: 1 | Status: AC

## 2021-11-04 MED FILL — Verapamil HCl IV Soln 2.5 MG/ML: INTRAVENOUS | Qty: 2 | Status: AC

## 2021-11-04 MED FILL — Lidocaine HCl Local Preservative Free (PF) Inj 1%: INTRAMUSCULAR | Qty: 30 | Status: AC

## 2021-11-04 NOTE — Progress Notes (Signed)
?   11/04/21 1600  ?Clinical Encounter Type  ?Visited With Family  ?Visit Type Initial;Other (Comment) ?(Advanced Directive)  ?Referral From Nurse  ?Consult/Referral To Chaplain  ? ?Chaplain responded to a call for advanced directive support.  ?Patient was receiving care but I was able to go over the paperwork with the family members who were supporting him. They will work with the patient to complete the forms.  ? ?Danice Goltz ?Chaplain  ?[facility]  ?[phone]  ?

## 2021-11-04 NOTE — TOC Progression Note (Addendum)
Transition of Care (TOC) - Progression Note  ? ? ?Patient Details  ?Name: Donald [name] ?MRN: [id number] ?Date of Birth: 08-26-1955 ? ?Transition of Care (TOC) CM/SW Contact  ?Milas Gain, LCSWA ?Phone Number: ?11/04/2021, 10:09 AM ? ?Clinical Narrative:    ? ? ?Update-4:25pm- Patients insurance authorization has been approved reference # [id number]. Insurance authorizaiton has been approved from 5/8-5/10. CSW called Sharyn Lull with [facility] to inform her of approval. Sharyn Lull confirmed she will have SNF bed for patient tomorrow. CSW informed MD. ? ?Update-3:56pm- Patients insurance authorization still pending. Patient has SN[facility] bed at [facility]. CSW will continue to follow. ? ?Update 2:43pm  Patients insurance authorization still pending. Patient requesting assist with HCPOA paperwork. CSW contacted chaplain Juliann Pulse who confirmed she will go see patient. ?Update- 11:03am- Patients passr number has been approved. Patients insurance authorization currently pending. CSW will continue to follow. ? ? ?Update- CSW received callback from [place] with [facility] who confirmed they can accept patient for SNF placement when medically ready for dc. Patients insurance auth pending and passr pending. ? ?CSW spoke with patients sister [name] regarding SNF bed offers. [name] chose [facility] for SNF placement for patient. CSW called Sharyn Lull with [facility] to confirm SNF bed offer. CSW awaiting callback. CSW started insurance authorization for patient reference # [id number]. Patients Passr number currently pending. CSW will continue to follow and assist with patients dc planning needs. ? ?Expected Discharge Plan: [place] ?Barriers to Discharge: Continued Medical Work up ? ?Expected Discharge Plan and Services ?Expected Discharge Plan: [place] ?  ?Discharge Planning Services: CM Consult ?Post Acute Care Choice: [place] ?Living arrangements for the past 2 months: [place] ?                ?DME Arranged: N/A ?DME Agency: NA ?  ?  ?  ?HH Arranged: NA ?  ?  ?  ?  ? ? ?Social Determinants of Health (SDOH) Interventions ?  ? ?Readmission Risk Interventions ?   ? View : No data to display.  ?  ?  ?  ? ? ?

## 2021-11-04 NOTE — Progress Notes (Addendum)
? ?Progress Note ? ?Patient Name: Donald [name] ?Date of Encounter: 11/04/2021 ? ?[facility] Cardiologist: Sanda Klein, MD  ? ?Subjective  ? ?Patient reports that he is feeling much better. Denies any chest pain, palpitations, SOB.  ? ?Inpatient Medications  ?  ?Scheduled Meds: ? apixaban  2.5 mg Oral BID  ? arformoterol  15 mcg Nebulization BID  ? atorvastatin  40 mg Oral Daily  ? carvedilol  6.25 mg Oral BID WC  ? docusate sodium  100 mg Oral BID  ? donepezil  10 mg Oral QHS  ? feeding supplement (NEPRO CARB STEADY)  237 mL Oral BID BM  ? furosemide  40 mg Oral Daily  ? gabapentin  100 mg Oral Daily  ? isosorbide mononitrate  30 mg Oral Daily  ? mirtazapine  15 mg Oral QHS  ? multivitamin with minerals  1 tablet Oral Daily  ? potassium chloride  40 mEq Oral Once  ? revefenacin  175 mcg Nebulization Daily  ? sodium chloride flush  3 mL Intravenous Q12H  ? sodium chloride flush  3 mL Intravenous Q12H  ? ?Continuous Infusions: ? ?PRN Meds: ?acetaminophen **OR** acetaminophen, albuterol, bisacodyl, food thickener, guaiFENesin, hydrALAZINE, ondansetron **OR** ondansetron (ZOFRAN) IV, oxyCODONE, polyethylene glycol, traZODone  ? ?Vital Signs  ?  ?Vitals:  ? 11/03/21 2342 11/04/21 0414 11/04/21 0810 11/04/21 0825  ?BP: 96/73 101/63 127/66   ?Pulse: 69 71 63   ?Resp: 13  20   ?Temp: (!) 97.4 ?F (36.3 ?C) (!) 97.3 ?F (36.3 ?C) 97.7 ?F (36.5 ?C)   ?TempSrc: Oral Oral Oral   ?SpO2: 97% 99%  97%  ?Weight:  47.1 kg    ?Height:      ? ? ?Intake/Output Summary (Last 24 hours) at 11/04/2021 0942 ?Last data filed at 11/04/2021 0414 ?Gross per 24 hour  ?Intake 398 ml  ?Output 750 ml  ?Net -352 ml  ? ? ?  11/04/2021  ?  4:14 AM 11/03/2021  ?  4:40 AM 11/02/2021  ?  6:01 AM  ?Last 3 Weights  ?Weight (lbs) 103 lb 13.4 oz 105 lb 2.6 oz 106 lb 4.2 oz  ?Weight (kg) 47.1 kg 47.7 kg 48.2 kg  ?   ? ?Telemetry  ?  ?Sinus rhythm with frequent PVCs - Personally Reviewed ? ?ECG  ?  ?No new tracings - Personally Reviewed ? ?Physical Exam   ? ?GEN: No acute distress. Frail, elderly gentleman    ?Neck: No JVD ?Cardiac: RRR, no murmurs, rubs, or gallops.  ?Respiratory: Clear to auscultation bilaterally. ?GI: Soft, nontender, non-distended  ?MS: No edema; No deformity. ?Neuro:  Nonfocal  ?Psych: Normal affect  ? ?Labs  ?  ?High Sensitivity Troponin:   ?Recent Labs  ?Lab 10/28/21 ?0926 10/28/21 ?1314  ?TROPONINIHS 49* 41*  ?   ?Chemistry ?Recent Labs  ?Lab 10/30/21 ?0258 10/31/21 ?5093 11/01/21 ?0133 11/02/21 ?2671 11/03/21 ?2458 11/04/21 ?0133  ?NA 137   < > 141 140 141 138  ?K 4.1   < > 3.1* 3.7 3.8 3.6  ?CL 108   < > 108 108 103 102  ?CO2 19*   < > 21* 21* 25 24  ?GLUCOSE 107*   < > 105* 83 98 88  ?BUN 19   < > 26* 30* 33* 31*  ?CREATININE 1.50*   < > 1.69* 1.57* 1.72* 1.62*  ?CALCIUM 9.0   < > 8.5* 8.7* 9.0 9.0  ?MG 1.7  --  1.8 2.0  --  1.9  ?PROT  6.0*  --   --   --   --   --   ?ALBUMIN 3.4*  --   --   --   --   --   ?AST 23  --   --   --   --   --   ?ALT 30  --   --   --   --   --   ?ALKPHOS 12*  --   --   --   --   --   ?BILITOT 1.5*  --   --   --   --   --   ?GFRNONAA 51*   < > 44* 48* 43* 47*  ?ANIONGAP 10   < > '12 11 13 12  '$ ? < > = values in this interval not displayed.  ?  ?Lipids No results for input(s): CHOL, TRIG, HDL, LABVLDL, LDLCALC, CHOLHDL in the last 168 hours.  ?Hematology ?Recent Labs  ?Lab 10/30/21 ?0258  ?WBC 12.0*  ?RBC 4.86  ?HGB 13.3  ?HCT 40.5  ?MCV 83.3  ?MCH 27.4  ?MCHC 32.8  ?RDW 13.6  ?PLT 237  ? ?Thyroid  ?Recent Labs  ?Lab 10/28/21 ?2011  ?TSH 0.960  ?  ?BNP ?Recent Labs  ?Lab 10/31/21 ?0340  ?BNP 1,371.3*  ?  ?DDimer No results for input(s): DDIMER in the last 168 hours.  ? ?Radiology  ?  ?No results found. ? ?Cardiac Studies  ? ?CARDIAC CATH: 11/01/2021 ?- Aborted due to hypotension and hypoxia  ?  ?ECHO: 10/29/2021 ? 1. Left ventricular ejection fraction, by estimation, is 25 to 30%. The  ?left ventricle has severely decreased function. The left ventricle  ?demonstrates regional wall motion abnormalities (see scoring   ?diagram/findings for description). The left  ?ventricular internal cavity size was moderately dilated. Left ventricular  ?diastolic parameters are indeterminate.  ?LV Wall Scoring: The entire apex and entire inferior wall are akinetic. The antero-lateral wall and posterior wall are hypokinetic. The anterior wall, anterior septum, mid inferoseptal segment, and basal inferoseptal segment are normal.  ? 2. Right ventricular systolic function is mildly reduced. The right  ?ventricular size is normal. Tricuspid regurgitation signal is inadequate  ?for assessing PA pressure.  ? 3. The mitral valve is normal in structure. Mild mitral valve  ?regurgitation.  ? 4. The aortic valve was not well visualized. Aortic valve regurgitation  ?is not visualized. No aortic stenosis is present.  ? ?Patient Profile  ?   ?66 y.o. male with a hx of CAD s/p CABG x3 (LIMA-LAD, SVG-RCA, SVG-OM2)'20, HFrEF with apical aneurysm with no LV thrombus, HLD, CVA, CKD II, brain aneurysm, paroxsymal Afib s/p ILR was admitted 05/01 with URI sx, +PNA s/p thoracentesis. Echo showed decreased LV function. Underwent LHC that was aborted 2/2 hypoxia and hypotension with 1 mg of versed s/p narcan.  ? ?Assessment & Plan  ?  ?Bilateral pleural effusions c/F PNA  ?- S/p bilateral thoracentesis with 1300 ml out of each lung on 5/2 ?- CXR 05/02 was initially improved, 05/03 w/ progressive CHF changes ?- CXR 05/05 with improvement in CHF, no sig effusions ? ?Acute on Chronic HFrEF  ?Ischemic cardiomyopathy  ?CAD s/p CABG  ?- EF historically 40-45% in 03/2021, now 25-30% this admission  ?- Attempted LHC this admission to evaluate for ischemic etiology, however patient became hypotensive and hypoxic with very low does sedation. ?- Patient denies chest pain, SOB. As he is stable, we will medically manage for now with plans for outpatient stress test.  ?-  OP stress test and follow up appointments have been arranged  ?- Continue carvedilol 6.25 mg BID  ?- Was on  lasix 40 mg IV BID this admission, transitioned to oral lasix yesterday after a mild increase in creatinine.  Creatinine improving and appears to be back at baseline (baseline 1.4-1.6)  ?- GDMT difficult to titrate due to hypotension and CKD  ? ?Ventricular Ectopy  ?- Continue carvedilol as above, difficult to titrate due to low BP  ?- Maintain K>4, mag >2   ? ?Paroxysmal Atrial Fibrillation  ?- Maintaining sinus rhythm with ectopy  ?- Continue carvedilol, eliquis  ? ?CKD stage IIIa ?- Avoid nephrotoxic medications  ?- Baseline creatinine appears to be 1.4-1.69 ? ?Otherwise per primary  ?- Dysphagia  ?- Prior CVA with brain aneurysm  ?- Suspected vascular dementia  ?- Depression, anxiety  ?- Hypothyroidism  ?- Malnutrition  ? ?   ? ?For questions or updates, please contact [place] ?Please consult [email or web address] for contact info under  ? ?  ?   ?Signed, ?Margie Billet, PA-C  ?11/04/2021, 9:42 AM   ? ?Attending Note:  ? ?The patient was seen and examined.  Agree with assessment and plan as noted above.  Changes made to the above note as needed. ? ?Patient seen and independently examined with Vikki Ports, PA .   We discussed all aspects of the encounter. I agree with the assessment and plan as stated above.  ? ?    CAD.   S/p CABG.    Presented with + troponins.  Trend is fairly flat.    ?He is stable for now.   Will arrange for an OP Cardiac PET scan or myoview  ?Continue current meds.  ? ?2.  PAF :   maintaining NSR  ?CKD: ? ? ? ? ? I have spent a total of 40 minutes with patient reviewing hospital  notes , telemetry, EKGs, labs and examining patient as well as establishing an assessment and plan that was discussed with the patient.  > 50% of time was spent in direct patient care. ? ? ? ?Thayer Headings, Brooke Bonito., MD, [facility] ?11/04/2021, 10:20 AM ?[address],  [address] ?Office - [phone] ?Pager [phone] ? ? ? ?

## 2021-11-04 NOTE — Progress Notes (Signed)
?PROGRESS NOTE ?  ?Donald [name]  CWC:[id number]    DOB: [birth year]    DOA: 10/28/2021 ? ?PCP: No primary care provider on file.  ? ?I have briefly reviewed patients previous medical records in [facility]. ? ?Chief Complaint  ?Patient presents with  ? Shortness of Breath  ? Chest Pain  ? ? ?Brief Narrative:  ? 66 year old male, lives with his nephew and independent, medical history significant for CAD s/p CABG x3, chronic systolic CHF with apical aneurysm, HLD, CVA, CKD stage, brain aneurysm, paroxysmal A-fib s/p ILR, remote history of smoking, GERD, anxiety and depression, presented to ED with unintentional weight loss and failure to thrive over several months, progressive dyspnea on exertion, orthopnea and dry cough, progressive worsening memory loss and confusion and chronic dysphagia.  In ED, Tmax of 96 degrees, respiratory rate of 30 and CT chest showed bilateral large pleural effusion and possible multifocal pneumonia.  Admitted for sepsis due to suspected pneumonia, large bilateral pleural effusions.  PCCM consulted and s/p bilateral thoracentesis, fluid transudative, culture and cytology negative.  Worsening cardiomyopathy, cardiology consulted.  Overnight 5/3 developed worsening dyspnea, suspected due to acute pulmonary edema, given IV Lasix and placed on BiPAP.  Clinically much improved. At Cath Lab on 5/5, developed transient hypotension and hypoxia after 1 mg of Versed and 25 mcg of fentanyl and hence cath canceled, plans for outpatient ischemic evaluation.  Medically optimized for DC to SNF pending insurance authorization. ? ? ?Assessment & Plan:  ?Principal Problem: ?  Community acquired pneumonia ?Active Problems: ?  Coronary artery disease ?  History of stroke ?  Acute on chronic combined systolic and diastolic CHF (congestive heart failure) ([place]) ?  Chronic a-fib ([place]) ?  Vascular dementia with behavior disturbance ([place]) ?  Hyperlipidemia ?  Hypothyroidism ?  Chronic kidney disease, stage 3a  ([place]) ?  DNR (do not resuscitate) ?  Malnutrition of moderate degree ?  Acute respiratory failure with hypoxia ([place]) ?  Pleural effusion ? ? ?Sepsis due to suspected aspiration pneumonia: Met SIRS criteria on admission including hypothermia and tachypnea.  CTA chest showed multifocal pneumonia and bilateral moderate to large pleural effusions.  COVID-19 and flu RT-PCR negative.  Urinary pneumococcal and Legionella antigen negative.  Blood cultures negative.  Lactate was up to 2.9.  Procalcitonin negative.  QuantiFERON-TB gold: Negative.  Treated with IV fluids.  Sepsis resolved.  Completed 5 days course of appropriate IV antibiotics. ?Bilateral moderate to large pleural effusions: PCCM was consulted and underwent bilateral thoracentesis on 5/2 (1.4 L on right and 1.3 L on left).  Fluid transudative in nature.  Cultures negative.  Cytology from bilateral pleural fluid: Reactive mesothelial cells.  Improved.  May be related to worsening cardiomyopathy and possible CHF. ?Acute respiratory failure with hypoxia: Overnight 5/3, hypotensive, bolused with IV fluids, then worsening dyspnea, chest x-ray with pulmonary edema, given IV Lasix, transiently required BiPAP. Weaned off to room air.  PCCM signed off and have arranged outpatient follow-up. ?Acute on chronic systolic CHF: Prior LVEF 76-16%.  Echo this admission showed further reduced EF to 25-30% with wall motion abnormalities.  Continue aspirin, statins, Coreg and Imdur.  Overnight decompensation last week, likely related to IV fluid bolus.  Diuresed for several days with IV Lasix 40 mg twice daily.  Clinically euvolemic.  Creatinine with slight uptick, starting to trend down again.  Cardiology has transitioned Lasix to p.o. follow BMP periodically. ?Paroxysmal A-fib s/p ILR: Currently in sinus rhythm.  Eliquis held for cardiac cath,  cath canceled and Eliquis resumed.  Continue beta-blockers. ?NSVT: Likely related to cardiomyopathy.  Continue beta-blockers and up  titration continues to be limited by soft blood pressures with SBP in the low 100s.  Keep K >4 and magnesium >2.  Still with some NSVT's and PVCs but less. ?CAD s/p CABG: History of intermittent chest pains and dyspnea for few months.  Continue , statins, beta-blocker and Imdur.  Cardiac cath attempt 5/5 unsuccessful due to hypotension and hypoxia after premeds.  Cardiology arranging for outpatient stress test. ?Hyperlipidemia: Continue statins ?Stage IIIa CKD: Labile serum creatinine but appears to be close to his baseline.  Creatinine has gone up from 1.41-1.69-1.57-1.72, improving 1.6. ?Dysphagia: As per SLP, dysphagia 2 solids and nectar thickened liquids. ??  COPD: No clinical exacerbation.  Remote smoking history. ?Prior CVA with brain aneurysm: On Plavix ?Suspected vascular dementia: On Aricept. ?Depression & Anxiety: Continue current meds. ?Hypothyroid history: Not on meds.  TSH 0.960. ?Non-anion gap metabolic acidosis: Improved.   ?Dusky discoloration of glans penis: Unclear etiology.  Improved/resolved. ? ?Body mass index is 18.39 kg/m?. ? ?Nutritional Status ?Nutrition Problem: Moderate Malnutrition ?Etiology: chronic illness (cerebral aneurysm, CAD s/p CABG, CHF, CVA) ?Signs/Symptoms: moderate fat depletion, moderate muscle depletion ?Interventions: MVI, Nepro shake, Refer to RD note for recommendations ? ? ?DVT prophylaxis: apixaban (ELIQUIS) tablet 2.5 mg Start: 11/02/21 1615   ?  Code Status: DNR:  ?Family Communication: None at bedside. ?Disposition:  ?Status is: Inpatient ? ?Should be medically optimized for DC to SNF pending insurance authorization. ?  ?Consultants:   ?PCCM ?Cardiology ? ?Procedures:   ?Bilateral diagnostic and therapeutic thoracentesis ?BiPAP ? ?Antimicrobials:   ?As noted above ? ? ?Subjective:  ?Denies complaints.  No dyspnea or chest pain.  Had just eaten breakfast. ? ?Objective:  ? ?Vitals:  ? 11/04/21 0414 11/04/21 0810 11/04/21 0825 11/04/21 1143  ?BP: 101/63 127/66  (!)  101/56  ?Pulse: 71 63  74  ?Resp:  20  16  ?Temp: (!) 97.3 ?F (36.3 ?C) 97.7 ?F (36.5 ?C)  (!) 97.3 ?F (36.3 ?C)  ?TempSrc: Oral Oral  Oral  ?SpO2: 99%  97% 95%  ?Weight: 47.1 kg     ?Height:      ? ? ?General exam: Middle-age male, looks older than stated age, moderately built, frail and cachectic, bitemporal wasting, lying comfortably supine in bed, without distress ?Respiratory system: Clear to auscultation.  No increased work of breathing. ?Cardiovascular system: S1 & S2 heard, RRR. No JVD, murmurs, rubs, gallops or clicks. No pedal edema.  Telemetry personally reviewed: Sinus rhythm with a few PVCs.  Nonsustained short runs of SVT and NSVT. ?Gastrointestinal system: Abdomen is nondistended, soft and nontender. No organomegaly or masses felt. Normal bowel sounds heard. ?Central nervous system: Alert and oriented. No focal neurological deficits. ?Extremities: Symmetric 5 x 5 power. ?Skin: No rashes, lesions or ulcers.  Dusky discoloration of the glans penis has resolved.   ?Psychiatry: Judgement and insight impaired. Mood & affect flat. ? ? ? ?Data Reviewed:   ?I have personally reviewed following labs and imaging studies ? ? ?CBC: ?Recent Labs  ?Lab 10/30/21 ?0258  ?WBC 12.0*  ?HGB 13.3  ?HCT 40.5  ?MCV 83.3  ?PLT 237  ? ? ?Basic Metabolic Panel: ?Recent Labs  ?Lab 10/30/21 ?0258 10/31/21 ?6333 11/01/21 ?0133 11/02/21 ?5456 11/03/21 ?2563 11/04/21 ?0133  ?NA 137 140 141 140 141 138  ?K 4.1 4.5 3.1* 3.7 3.8 3.6  ?CL 108 114* 108 108 103 102  ?CO2 19*  16* 21* 21* 25 24  ?GLUCOSE 107* 105* 105* 83 98 88  ?BUN 19 24* 26* 30* 33* 31*  ?CREATININE 1.50* 1.41* 1.69* 1.57* 1.72* 1.62*  ?CALCIUM 9.0 8.3* 8.5* 8.7* 9.0 9.0  ?MG 1.7  --  1.8 2.0  --  1.9  ? ? ?Liver Function Tests: ?Recent Labs  ?Lab 10/30/21 ?0258  ?AST 23  ?ALT 30  ?ALKPHOS 12*  ?BILITOT 1.5*  ?PROT 6.0*  ?ALBUMIN 3.4*  ? ? ?CBG: ?No results for input(s): GLUCAP in the last 168 hours. ? ?Microbiology Studies:  ? ?Recent Results (from the past 240  hour(s))  ?Resp Panel by RT-PCR (Flu A&B, Covid) Nasopharyngeal Swab     Status: None  ? Collection Time: 10/28/21 12:05 PM  ? Specimen: Nasopharyngeal Swab; Nasopharyngeal(NP) swabs in vial transport medium  ?Resu

## 2021-11-04 NOTE — Progress Notes (Signed)
Dr. Algis Liming updated with pt low SBP at mid 80's to 91 on R arm. Left arm at 103/75. Clarifying if ok to give coreg. Rechecked manually BP as follows : L arm : 100/60;  Right arm : 110/58. Coincidentally telemetry center called with non sustained vtach at 13 bts. Asymptomatic. Pt says he feels ok. With instruction from Dr Algis Liming to give [place]. Given as ordered.  ?

## 2021-11-04 NOTE — Progress Notes (Signed)
RE:   Donald [name]     ?Date of Birth:  03/28/1956     ?Date:   11/04/2021     ? ? ?To Whom It May Concern: ? ?Please be advised that the above-named patient has a primary diagnosis of dementia which supersedes any psychiatric diagnosis.  ? ?

## 2021-11-04 NOTE — Progress Notes (Signed)
Speech Language Pathology Treatment: Dysphagia  ?Patient Details ?Name: Donald [name] ?MRN: [id number] ?DOB: [birth year] ?Today's Date: 11/04/2021 ?Time: 1324-4010 ?SLP Time Calculation (min) (ACUTE ONLY): 13 min ? ?Assessment / Plan / Recommendation ?Clinical Impression ? Treatment of dysphagia included observation and education re: swallow status and assessments thus far. He was not congested at rest which is an improvement from previous session with this therapist (during [place]) and appeared improved from cognitive standpoint. He recalled MBS and was appropriate this session without evidence of poor memory as last session with SLP colleague. He had nectar thick soda at bedside and consumed without immediate s/s aspiration. There was an immediate cough following graham cracker solid and one delayed throat clear during session. He has chronic dysphagia and is at a higher risk seen during his MBS- nectar thick and Dys 2 hopefully mediate this risk for pt but unable to cease. Overall, his clinical observation appears improved from last week. Pt wishes to continue Dys 2 texture and therapist is in agreement. Plans to discharge to SNF and pt should continue nectar thick and Dys 2 (fine chopped), cup sips and no straws. Pills crushed in puree.  ?  ?HPI HPI: Donald [name] is a 66 y.o. male who presented to [facility] ED with URI symptoms. Recent ~30 lb unintentional weight loss. Chest CT 5/1: "There are patchy infiltrates in both upper lobes suggesting  multifocal pneumonia. Moderate to large bilateral pleural effusions  are seen. There are moderate to large infiltrates in both lower  lobes suggesting compression atelectasis and pneumonia." Pt with medical history significant of cerebral aneurysm, on Plavix; C diff colitis (2015); CAD s/p CABG; afib; hypothyroidism; chronic systolic CHF; and CVA. Pt reported he has had swallow difficulty "since I was a baby." This is first time having pneumonia. ?  ?   ?SLP Plan ? Continue with  current plan of care ? ?  ?  ?Recommendations for follow up therapy are one component of a multi-disciplinary discharge planning process, led by the attending physician.  Recommendations may be updated based on patient status, additional functional criteria and insurance authorization. ?  ? ?Recommendations  ?Diet recommendations: Dysphagia 2 (fine chop);Nectar-thick liquid ?Liquids provided via: Cup;No straw ?Medication Administration: Crushed with puree ?Supervision: Patient able to self feed;Intermittent supervision to cue for compensatory strategies ?Compensations: Slow rate;Small sips/bites;Multiple dry swallows after each bite/sip;Clear throat intermittently ?Postural Changes and/or Swallow Maneuvers: Seated upright 90 degrees;Upright 30-60 min after meal  ?   ?    ?   ? ? ? ? Oral Care Recommendations: Oral care BID ?Follow Up Recommendations: Skilled nursing-short term rehab (<3 hours/day) ?Assistance recommended at discharge: Intermittent Supervision/Assistance ?SLP Visit Diagnosis: Dysphagia, oropharyngeal phase (R13.12) ?Plan: Continue with current plan of care ? ? ? ? ?  ?  ? ? ?Houston Siren ? ?11/04/2021, 11:17 AM ?

## 2021-11-04 NOTE — Care Management Important Message (Signed)
Important Message ? ?Patient Details  ?Name: Donald [name] ?MRN: [id number] ?Date of Birth: 12-May-1956 ? ? ?Medicare Important Message Given:  Yes ? ? ? ? ?[name] ?11/04/2021, 9:03 AM ?

## 2021-11-04 NOTE — Progress Notes (Signed)
HOSPITAL MEDICINE OVERNIGHT EVENT NOTE   ? ?Notified by nursing that patient has been having frequent loose stools.  Nursing reports approximately 4 episodes have been occurring daily over the past week.  Stools are watery and nonbloody. ? ?Nursing reports no evidence of abdominal tenderness.  Patient not exhibiting any fevers. ? ?Chart reviewed, patient has completed a course of antibiotics earlier in the hospital stay.  Based on patient's clinical picture and physical exam findings above it is unlikely that patient is suffering from an infectious cause.  If diarrhea recurs, will consider administration of an antimotility agent such as loperamide.  Otherwise, day team can reevaluate in the morning to help determine etiology of diarrhea. ? ?Donald Emerald  MD ?[facility]  ? ? ? ? ? ? ? ? ? ? ? ?

## 2021-11-05 ENCOUNTER — Telehealth: Payer: Self-pay

## 2021-11-05 DIAGNOSIS — E440 Moderate protein-calorie malnutrition: Secondary | ICD-10-CM

## 2021-11-05 DIAGNOSIS — J9601 Acute respiratory failure with hypoxia: Secondary | ICD-10-CM | POA: Diagnosis not present

## 2021-11-05 DIAGNOSIS — J189 Pneumonia, unspecified organism: Secondary | ICD-10-CM | POA: Diagnosis not present

## 2021-11-05 DIAGNOSIS — J90 Pleural effusion, not elsewhere classified: Secondary | ICD-10-CM | POA: Diagnosis not present

## 2021-11-05 LAB — BASIC METABOLIC PANEL
Anion gap: 9 (ref 5–15)
BUN: 30 mg/dL — ABNORMAL HIGH (ref 8–23)
CO2: 26 mmol/L (ref 22–32)
Calcium: 9 mg/dL (ref 8.9–10.3)
Chloride: 102 mmol/L (ref 98–111)
Creatinine, Ser: 1.68 mg/dL — ABNORMAL HIGH (ref 0.61–1.24)
GFR, Estimated: 45 mL/min — ABNORMAL LOW (ref >60)
Glucose, Bld: 88 mg/dL (ref 70–99)
Potassium: 4 mmol/L (ref 3.5–5.1)
Sodium: 137 mmol/L (ref 135–145)

## 2021-11-05 LAB — MAGNESIUM: Magnesium: 2.5 mg/dL — ABNORMAL HIGH (ref 1.7–2.4)

## 2021-11-05 MED ORDER — GABAPENTIN 100 MG PO CAPS
100.0000 mg | ORAL_CAPSULE | Freq: Every day | ORAL | Status: DC
Start: 2021-11-05 — End: 2022-09-15

## 2021-11-05 MED ORDER — ADULT MULTIVITAMIN W/MINERALS CH: 1.0000 | ORAL_TABLET | Freq: Every day | ORAL | Status: DC

## 2021-11-05 MED ORDER — NEPRO/CARBSTEADY PO LIQD: 237.0000 mL | Freq: Two times a day (BID) | ORAL | Status: DC

## 2021-11-05 MED ORDER — POTASSIUM CHLORIDE CRYS ER 20 MEQ PO TBCR: 20.0000 meq | EXTENDED_RELEASE_TABLET | Freq: Every day | ORAL | Status: DC

## 2021-11-05 MED ORDER — FUROSEMIDE 40 MG PO TABS: 40.0000 mg | ORAL_TABLET | Freq: Every day | ORAL | Status: DC

## 2021-11-05 MED ORDER — ALBUTEROL SULFATE HFA 108 (90 BASE) MCG/ACT IN AERS: 2.0000 | INHALATION_SPRAY | RESPIRATORY_TRACT | Status: DC | PRN

## 2021-11-05 MED ORDER — FOOD THICKENER (SIMPLYTHICK)
1.0000 | ORAL | Status: DC | PRN
Start: 2021-11-05 — End: 2022-01-01

## 2021-11-05 MED ORDER — APIXABAN 2.5 MG PO TABS: 2.5000 mg | ORAL_TABLET | Freq: Two times a day (BID) | ORAL | Status: DC

## 2021-11-05 NOTE — Telephone Encounter (Signed)
Richardson Dopp, PA already ordered myoview. Since patient is currently in the hospital and myoview has not been scheduled yet, unable to schedule follow-up appointment at this time. Will forward to Dr. Victorino December nurse to follow. ?

## 2021-11-05 NOTE — Telephone Encounter (Signed)
-----   Message from Margie Billet, PA-C sent at 11/05/2021  9:58 AM EDT ----- ?Regarding: Stress Test and Follow Up ?Good morning ? ?I believe Richardson Dopp already sent a message about this but I do not see it in the system. Please schedule an outpatient stress test and arrange f/u with APP or Dr. Sallyanne Kuster after Myoview. ? ?Thanks! ?Margie Billet, PA-C ?11/05/2021 10:00 AM ? ? ?

## 2021-11-05 NOTE — TOC Transition Note (Addendum)
Transition of Care (TOC) - CM/SW Discharge Note ? ? ?Patient Details  ?Name: Donald [name] ?MRN: [id number] ?Date of Birth: 1956-04-27 ? ?Transition of Care (TOC) CM/SW Contact:  ?Milas Gain, LCSWA ?Phone Number: ?11/05/2021, 11:47 AM ? ? ?Clinical Narrative:    ? ?Patient will DC to: [facility] ? ?Anticipated DC date: 11/05/2021 ? ?Family notified: [name] ? ?Transport by: Corey Harold ? ?? ? ?Per MD patient ready for DC to [facility] . RN, patient, patient's family, and facility notified of DC. Discharge Summary sent to facility. RN given number for report tele#[phone] [phone]. DC packet on chart. DNR signed by MD attached to patients DC packet.Ambulance transport requested for patient. ? ?CSW signing off.  ? ?Final next level of care: [place] ?Barriers to Discharge: No Barriers Identified ? ? ?Patient Goals and CMS Choice ?Patient states their goals for this hospitalization and ongoing recovery are:: Plan for Rehab at the facility ?CMS Medicare.gov Compare Post Acute Care list provided to:: Patient Represenative (must comment) (Pateints sister [name]) ?Choice offered to / list presented to : Sibling (Patients sister [name]) ? ?Discharge Placement ?  ?           ?Patient chooses bed at: [facility] ?Patient to be transferred to facility by: [facility] ?Name of family member notified: [name] ?Patient and family notified of of transfer: 11/05/21 ? ?Discharge Plan and Services ?  ?Discharge Planning Services: CM Consult ?Post Acute Care Choice: [place]          ?DME Arranged: N/A ?DME Agency: NA ?  ?  ?  ?HH Arranged: NA ?  ?  ?  ?  ? ?Social Determinants of Health (SDOH) Interventions ?  ? ? ?Readmission Risk Interventions ?   ? View : No data to display.  ?  ?  ?  ? ? ? ? ? ?

## 2021-11-05 NOTE — Progress Notes (Signed)
Report called to [facility] place and spoke with Abede RN .  ?

## 2021-11-05 NOTE — Discharge Summary (Signed)
Physician Discharge Summary  ?Donald [name] ELY:[id number] DOB: [birth year] ? ?PCP: No primary care provider on file. ? ?Admitted from: Home  ?Discharged to: SNF ? ?Admit date: 10/28/2021 ?Discharge date: 11/05/2021 ? ?Recommendations for Outpatient Follow-up:  ? ? Follow-up Information   ? ? MD at SNF Follow up.   ?Why: To be seen in 2 to 3 days with repeat labs (CBC, CMP, magnesium). ? ?  ?  ? ? Croitoru, Dani Gobble, MD .   ?Specialty: Cardiology ?Why: MDs office will arrange for outpatient stress test and follow-up appointment with MD.  Please call the office if you do not hear from them in 3-5 business days. ?Contact information: ?[address] ?[address] ?[place] [address] ?[phone] ? ? ?  ?  ? ? Freddi Starr, MD Follow up on 12/02/2021.   ?Specialty: Pulmonary Disease ?Why: 10:15 am. ?Contact information: ?[address] ?[address] ?[place] [address] ?[phone] ? ? ?  ?  ? ?  ?  ? ?  ? ? ? ?Home Health: None ?  ? ?Equipment/Devices: TBD at SNF ?  ? ?Discharge Condition: Improved and stable. ?  Code Status: DNR ?Diet recommendation:  ?Discharge Diet Orders (From admission, onward)  ? ?  Start     Ordered  ? 11/05/21 0000  Diet - low sodium heart healthy       ?Comments: DIET DYS 2; Fluid consistency: Nectar Thick  Diet.  ? 11/05/21 1034  ? ?  ?  ? ?  ?  ? ?Discharge Diagnoses:  ?Principal Problem: ?  Community acquired pneumonia ?Active Problems: ?  Coronary artery disease ?  History of stroke ?  Acute on chronic combined systolic and diastolic CHF (congestive heart failure) ([place]) ?  Chronic a-fib ([place]) ?  Vascular dementia with behavior disturbance ([place]) ?  Hyperlipidemia ?  Hypothyroidism ?  Chronic kidney disease, stage 3a ([place]) ?  DNR (do not resuscitate) ?  Malnutrition of moderate degree ?  Acute respiratory failure with hypoxia ([place]) ?  Pleural effusion ? ? ?Brief Summary: ? 66 year old male, lives with his nephew and independent, medical history significant for CAD s/p CABG x3,  chronic systolic CHF with apical aneurysm, HLD, CVA, CKD stage, brain aneurysm, paroxysmal A-fib s/p ILR, remote history of smoking, GERD, anxiety and depression, presented to ED with unintentional weight loss and failure to thrive over several months, progressive dyspnea on exertion, orthopnea and dry cough, progressive worsening memory loss and confusion and chronic dysphagia.  In ED, Tmax of 96 degrees, respiratory rate of 30 and CT chest showed bilateral large pleural effusion and possible multifocal pneumonia.  Admitted for sepsis due to suspected pneumonia, large bilateral pleural effusions.  PCCM consulted and s/p bilateral thoracentesis, fluid transudative, culture and cytology negative.  Worsening cardiomyopathy, cardiology consulted.  Overnight 5/3 developed worsening dyspnea, suspected due to acute pulmonary edema, given IV Lasix and placed on BiPAP.  Clinically much improved. At Cath Lab on 5/5, developed transient hypotension and hypoxia after 1 mg of Versed and 25 mcg of fentanyl and hence cath canceled, plans for outpatient ischemic evaluation.  ?  ?  ?Assessment & Plan:  ?Sepsis due to suspected aspiration pneumonia: Met SIRS criteria on admission including hypothermia and tachypnea.  CTA chest showed multifocal pneumonia and bilateral moderate to large pleural effusions.  COVID-19 and flu RT-PCR negative.  Urinary pneumococcal and Legionella antigen negative.  Blood cultures negative.  Lactate was up to 2.9.  Procalcitonin negative.  QuantiFERON-TB gold: Negative.  Treated with IV fluids.  Sepsis resolved.  Completed 5 days course of appropriate IV antibiotics. ?Bilateral moderate to large pleural effusions: PCCM was consulted and underwent bilateral thoracentesis on 5/2 (1.4 L on right and 1.3 L on left).  Fluid transudative in nature.  Cultures negative.  Cytology from bilateral pleural fluid: Reactive mesothelial cells.  Improved.  May be related to worsening cardiomyopathy and possible  CHF. ?Acute respiratory failure with hypoxia: Overnight 5/3, hypotensive, bolused with IV fluids, then worsening dyspnea, chest x-ray with pulmonary edema, given IV Lasix, transiently required BiPAP. Weaned off to room air.  PCCM signed off and have arranged outpatient follow-up. ?Acute on chronic systolic CHF: Prior LVEF 93-23%.  Echo this admission showed further reduced EF to 25-30% with wall motion abnormalities.  Continue Lasix, statins, Coreg and Imdur.  Overnight decompensation last week, likely related to IV fluid bolus.  Diuresed for several days with IV Lasix 40 mg twice daily.  Clinically euvolemic.  Creatinine with slight uptick, starting to trend down again.  Creatinine now stable in the 1.6 range.  Cardiology has transitioned Lasix to p.o. follow BMP periodically.  No aspirin due to being on Eliquis. ?Paroxysmal A-fib s/p ILR: Currently in sinus rhythm.  Eliquis held for cardiac cath, cath canceled and Eliquis resumed.  Continue beta-blockers low-dose and no room for titration up currently due to soft blood pressures. ?NSVT: Likely related to cardiomyopathy.  Continue beta-blockers and up titration continues to be limited by soft blood pressures with SBP in the low 100s.  Keep K >4 and magnesium >2.  Still with some NSVT's and PVCs but less. ?CAD s/p CABG: History of intermittent chest pains and dyspnea for few months.  Continue statins, beta-blocker and Imdur.  Cardiac cath attempt 5/5 unsuccessful due to hypotension and hypoxia after premeds.  Cardiology arranging for outpatient stress test.  No aspirin due to being on Eliquis and increases bleeding risk. ?Hyperlipidemia: Continue statins ?Stage IIIa CKD: Labile serum creatinine but appears to be close to his baseline.  Creatinine has gone up from 1.41-1.69-1.57-1.72, improving 1.6.  Creatinine has plateaued in the 1.6 range for the last 2 days. ?Dysphagia: As per SLP, dysphagia 2 solids and nectar thickened liquids.  Consider outpatient SLP  follow-up regarding possible advancing diet. ??  COPD: No clinical exacerbation.  Remote smoking history.  As needed albuterol inhaler.  Has outpatient follow-up with pulmonology. ?Prior CVA with brain aneurysm: As per [facility], was not on antiplatelets PTA. ?Suspected vascular dementia: On Aricept.  Consider outpatient neurology consultation for further neurocognitive evaluation.  Patient's family have quite many questions if he truly has dementia which need to be answered. ?Depression & Anxiety: Continue current meds.  Discontinued trazodone to avoid QT prolonging medications. ?Hypothyroid history: Not on meds.  TSH 0.960. ?Non-anion gap metabolic acidosis: Improved.   ?Dusky discoloration of glans penis: Unclear etiology.  Improved/resolved. ?  ?Body mass index is 18.39 kg/m?. ?  ?Nutritional Status ?Nutrition Problem: Moderate Malnutrition ?Etiology: chronic illness (cerebral aneurysm, CAD s/p CABG, CHF, CVA) ?Signs/Symptoms: moderate fat depletion, moderate muscle depletion ?Interventions: MVI, Nepro shake, Refer to RD note for recommendations ?  ?  ?Consultants:   ?PCCM ?Cardiology ?  ?Procedures:   ?Bilateral diagnostic and therapeutic thoracentesis ?BiPAP ? ?Discharge Instructions ? ?Discharge Instructions   ? ? (HEART FAILURE PATIENTS) Call MD:  Anytime you have any of the following symptoms: 1) 3 pound weight gain in 24 hours or 5 pounds in 1 week 2) shortness of breath, with or without a  dry hacking cough 3) swelling in the hands, feet or stomach 4) if you have to sleep on extra pillows at night in order to breathe.   Complete by: As directed ?  ? Call MD for:  difficulty breathing, headache or visual disturbances   Complete by: As directed ?  ? Call MD for:  extreme fatigue   Complete by: As directed ?  ? Call MD for:  persistant dizziness or light-headedness   Complete by: As directed ?  ? Call MD for:  persistant nausea and vomiting   Complete by: As directed ?  ? Call MD for:  severe uncontrolled pain    Complete by: As directed ?  ? Call MD for:  temperature >100.4   Complete by: As directed ?  ? Diet - low sodium heart healthy   Complete by: As directed ?  ? DIET DYS 2; Fluid consistency: Nectar Thick  Diet.  ? Increase a

## 2021-11-05 NOTE — Plan of Care (Signed)

## 2021-11-05 NOTE — Progress Notes (Signed)
Nutrition Follow-up ? ?DOCUMENTATION CODES:  ?Non-severe (moderate) malnutrition in context of chronic illness ? ?INTERVENTION:  ?Continue current diet as ordered per SLP ?Continue Nepro Shake po TID, each supplement provides 425 kcal and 19 grams protein ?Continue vitamin regimen, add folic acid daily for deficiency ? ?NUTRITION DIAGNOSIS:  ?Moderate Malnutrition related to chronic illness (cerebral aneurysm, CAD s/p CABG, CHF, CVA) as evidenced by moderate fat depletion, moderate muscle depletion. ?- remains applicable ? ?GOAL:  ?Patient will meet greater than or equal to 90% of their needs ?- progressing, diet and supplements in place ? ?MONITOR:  ?PO intake, Supplement acceptance, Diet advancement, Labs, Weight trends ? ?REASON FOR ASSESSMENT:  ?Consult ?Assessment of nutrition requirement/status, Diet education ? ?ASSESSMENT:  ?Pt admitted with URI symptoms, found to have sepsis PNA. PMH significant for cerebral aneurysm, C. Diff colitis (2015), CAD s/p CABG, afib, hypothyroidism, CHF, and CVA. ? ?5/3 - MBS: DYS2 with nectar thick liquids ?5/5 - heart cath attempted, pt developed hypoxia with sedation and procedure cancelled ? ?Pt resting in bed at the time of assessment. Noted pt to be dc today as he is medically stable and will follow-up for further cardiac interventions when stable.  ? ?Pt reports he thinks he has been eating pretty good, but does admit appetite is not at baseline. Denies difficulty chewing or swallowing the foods that he is receiving. Noted <50% average intake of recorded meals.  ? ?Pt states that he does like the Nepro supplements and is agreeable to all flavors. Will continue as ordered. Pt is quite thin and frail on visual inspection, would benefit from continue oral nutrition supplements and high kcal meals/snacks at discharge. ? ?Average Meal Intake: ?5/3-5/9: 38% intake x 4 recorded meals ? ?Nutritionally Relevant Medications: ?Scheduled Meds: ? atorvastatin  40 mg Oral Daily  ?  docusate sodium  100 mg Oral BID  ? NEPRO CARB STEADY  237 mL Oral BID BM  ? furosemide  40 mg Oral Daily  ? mirtazapine  15 mg Oral QHS  ? multivitamin with minerals  1 tablet Oral Daily  ? ?PRN Meds: bisacodyl, food thickener, ondansetron ? ?Labs Reviewed: ?BUN 30, creatinine 1.68 ?Mg 2.5 ? ?Vitamin Labs: ?Folate: 5.5 ?Thiamine: 94.1 ?Vitamin B12: 751 ?Vitamin B6: 9.8 ?Vitamin A: pending ? ?NUTRITION - FOCUSED PHYSICAL EXAM: ?Flowsheet Row Most Recent Value  ?Orbital Region Moderate depletion  ?Upper Arm Region Moderate depletion  ?Thoracic and Lumbar Region Moderate depletion  ?Buccal Region Severe depletion  ?Temple Region Mild depletion  ?Clavicle Bone Region Moderate depletion  ?Clavicle and Acromion Bone Region Moderate depletion  ?Scapular Bone Region Moderate depletion  ?Dorsal Hand Mild depletion  ?Patellar Region Severe depletion  ?Anterior Thigh Region Moderate depletion  ?Posterior Calf Region Moderate depletion  ?Edema (RD Assessment) None  ?Hair Reviewed  ?Eyes Other (Comment)  [small white spot on inner R eye]  ?Mouth Reviewed  ?Skin Reviewed  ?Nails Reviewed  ? ? ?Diet Order:   ?Diet Order   ? ?       ?  Diet - low sodium heart healthy       ?  ?  DIET DYS 2 Room service appropriate? Yes with Assist; Fluid consistency: Nectar Thick  Diet effective now       ?  ? ?  ?  ? ?  ? ?EDUCATION NEEDS:  ?Education needs have been addressed ? ?Skin:  Skin Assessment: Reviewed RN Assessment ? ?Last BM:  5/7 - type 7 ? ?Height:  ?Ht Readings from  Last 1 Encounters:  ?10/28/21 '5\' 3"'$  (1.6 m)  ? ?Weight:  ?Wt Readings from Last 1 Encounters:  ?11/05/21 46.2 kg  ? ? ?Ideal Body Weight:  56.4 kg ? ?BMI:  Body mass index is 18.04 kg/m?. ? ?Estimated Nutritional Needs:  ?Kcal:  1700-1900 ?Protein:  85-100g ?Fluid:  >/=1.7L ? ? ? ?Donald [name], RD, LDN ?Clinical Dietitian ?RD pager # available in [place]  ?After hours/weekend pager # available in [place] ?

## 2021-11-05 NOTE — Telephone Encounter (Signed)
Message sent to scheduling 

## 2021-11-05 NOTE — Plan of Care (Signed)
?  Problem: Respiratory: ?Goal: Ability to maintain adequate ventilation will improve ?Outcome: Progressing ?Goal: Ability to maintain a clear airway will improve ?Outcome: Progressing ?  ?

## 2021-11-05 NOTE — Progress Notes (Signed)
? ?  Pt will be scheduled for OP stress test. ? ?Will sign off ? ? ? ?Mertie Moores, MD  ?11/05/2021 10:34 AM    ?[facility] [address]  [address] ?[place], [place]  [address] ?Phone: [phone]; Fax: [phone]  ? ?

## 2021-11-05 NOTE — Progress Notes (Signed)
Physical Therapy Treatment ?Patient Details ?Name: Donald [name] ?MRN: [id number] ?DOB: Mar 31, 1956 ?Today's Date: 11/05/2021 ? ? ?History of Present Illness 66 y/o male adm 5/1 with SOB and chest pain as well as URI symptoms. Found to have sepsis secondary to PNA and bil large pleural effusions s/p thoracentesis 5/2. PMHx: dementia, depression, anxiety, CVA, CAD, CHF, MI, suicide attempt. ? ?  ?PT Comments  ? ? Pt with excellent progression with mobility able to walk in hall on RA this session with assist for balance, RW safety and cognition. Pt lives with nephew and is alone during the day. Continue to recommend ST-SNF unless family able to arrange 24hr supervision. Pt with cues for safety and progression. Will continue to follow.  ?100/61 (74) ?HR 69-97 ?   ?Recommendations for follow up therapy are one component of a multi-disciplinary discharge planning process, led by the attending physician.  Recommendations may be updated based on patient status, additional functional criteria and insurance authorization. ? ?Follow Up Recommendations ? Skilled nursing-short term rehab (<3 hours/day) ?  ?  ?Assistance Recommended at Discharge Intermittent Supervision/Assistance  ?Patient can return home with the following A lot of help with bathing/dressing/bathroom;Assistance with cooking/housework;Direct supervision/assist for medications management;Direct supervision/assist for financial management;Assist for transportation;Help with stairs or ramp for entrance;A little help with walking and/or transfers ?  ?Equipment Recommendations ? Rolling walker (2 wheels);BSC/3in1  ?  ?Recommendations for Other Services   ? ? ?  ?Precautions / Restrictions Precautions ?Precautions: Fall ?Restrictions ?Weight Bearing Restrictions: No  ?  ? ?Mobility ? Bed Mobility ?Overal bed mobility: Needs Assistance ?Bed Mobility: Sit to Supine ?  ?  ?  ?Sit to supine: Min guard ?  ?General bed mobility comments: cues for positioning without  physical assist ?  ? ?Transfers ?Overall transfer level: Needs assistance ?  ?Transfers: Sit to/from Stand ?Sit to Stand: Min assist ?  ?  ?  ?  ?  ?General transfer comment: min assist to rise from chair, toilet with rail and bed with pt able to perform total of 7 trials for conditioning ?  ? ?Ambulation/Gait ?Ambulation/Gait assistance: Min assist ?Gait Distance (Feet): 80 Feet ?Assistive device: Rolling walker (2 wheels) ?Gait Pattern/deviations: Shuffle, Trunk flexed ?  ?Gait velocity interpretation: <1.8 ft/sec, indicate of risk for recurrent falls ?  ?General Gait Details: pt with shor shuffling gait with cues for posture, proximity to RW and increased stride length with pt able to correct grossly 3 steps then returns to short steps. Cues for direction pt able to self-regulate distance ? ? ?Stairs ?  ?  ?  ?  ?  ? ? ?Wheelchair Mobility ?  ? ?Modified Rankin (Stroke Patients Only) ?  ? ? ?  ?Balance Overall balance assessment: Needs assistance ?Sitting-balance support: No upper extremity supported, Feet supported ?Sitting balance-Leahy Scale: Fair ?Sitting balance - Comments: EOB and toilet without UE support ?  ?Standing balance support: Bilateral upper extremity supported, Reliant on assistive device for balance ?Standing balance-Leahy Scale: Poor ?Standing balance comment: RW for standing ?  ?  ?  ?  ?  ?  ?  ?  ?  ?  ?  ?  ? ?  ?Cognition Arousal/Alertness: Awake/alert ?Behavior During Therapy: Flat affect ?Overall Cognitive Status: Impaired/Different from baseline ?Area of Impairment: Attention, Safety/judgement, Problem solving ?  ?  ?  ?  ?  ?  ?  ?  ?  ?Current Attention Level: Selective ?Memory: Decreased short-term memory ?Following Commands: Follows one step commands consistently ?  Safety/Judgement: Decreased awareness of deficits, Decreased awareness of safety ?  ?Problem Solving: Slow processing ?General Comments: pt sliding out of chair on arrival, had not called for assist. Cues for safety and  sequence with slow processing ?  ?  ? ?  ?Exercises General Exercises - Lower Extremity ?Long Arc Quad: AROM, Both, Seated, 20 reps ?Hip Flexion/Marching: AROM, Both, Seated, 20 reps ? ?  ?General Comments   ?  ?  ? ?Pertinent Vitals/Pain Pain Assessment ?Pain Assessment: No/denies pain  ? ? ?Home Living   ?  ?  ?  ?  ?  ?  ?  ?  ?  ?   ?  ?Prior Function    ?  ?  ?   ? ?PT Goals (current goals can now be found in the care plan section) Progress towards PT goals: Progressing toward goals ? ?  ?Frequency ? ? ? Min 2X/week ? ? ? ?  ?PT Plan Current plan remains appropriate  ? ? ?Co-evaluation   ?  ?  ?  ?  ? ?  ?AM-PAC PT "6 Clicks" Mobility   ?Outcome Measure ? Help needed turning from your back to your side while in a flat bed without using bedrails?: A Little ?Help needed moving from lying on your back to sitting on the side of a flat bed without using bedrails?: A Little ?Help needed moving to and from a bed to a chair (including a wheelchair)?: A Little ?Help needed standing up from a chair using your arms (e.g., wheelchair or bedside chair)?: A Little ?Help needed to walk in hospital room?: A Little ?Help needed climbing 3-5 steps with a railing? : Total ?6 Click Score: 16 ? ?  ?End of Session   ?Activity Tolerance: Patient tolerated treatment well ?Patient left: in bed;with call bell/phone within reach;with bed alarm set ?Nurse Communication: Mobility status ?PT Visit Diagnosis: Muscle weakness (generalized) (M62.81);Unsteadiness on feet (R26.81);Other (comment) ?  ? ? ?Time: 2637-8588 ?PT Time Calculation (min) (ACUTE ONLY): 27 min ? ?Charges:  $Gait Training: 8-22 mins ?$Therapeutic Activity: 8-22 mins          ?          ? ?Adetokunbo Mccadden P, PT ?Acute Rehabilitation Services ?Pager: [phone] ?Office: [phone] ? ? ? ?Mandolin Falwell B Kallan Bischoff ?11/05/2021, 11:16 AM ? ?

## 2021-11-05 NOTE — Discharge Instructions (Signed)

## 2021-11-05 NOTE — TOC Progression Note (Signed)
Transition of Care (TOC) - Progression Note  ? ? ?Patient Details  ?Name: Donald [name] ?MRN: [id number] ?Date of Birth: [birth year] ? ?Transition of Care (TOC) CM/SW Contact  ?Milas Gain, LCSWA ?Phone Number: ?11/05/2021, 10:00 AM ? ?Clinical Narrative:    ? ?CSW spoke with Sharyn Lull at [facility] who confirmed patient can dc over today if medically ready. CSW informed MD. CSW will continue to follow and assist with patients dc planning needs. ? ?Expected Discharge Plan: [place] ?Barriers to Discharge: No SNF bed (No bed today at [facility] ? ?Expected Discharge Plan and Services ?Expected Discharge Plan: [place] ?  ?Discharge Planning Services: CM Consult ?Post Acute Care Choice: [place] ?Living arrangements for the past 2 months: [place] ?                ?DME Arranged: N/A ?DME Agency: NA ?  ?  ?  ?HH Arranged: NA ?  ?  ?  ?  ? ? ?Social Determinants of Health (SDOH) Interventions ?  ? ?Readmission Risk Interventions ?   ? View : No data to display.  ?  ?  ?  ? ? ?

## 2021-11-05 NOTE — Progress Notes (Signed)
Occupational Therapy Treatment ?Patient Details ?Name: Donald [name] ?MRN: [id number] ?DOB: 08/14/1955 ?Today's Date: 11/05/2021 ? ? ?History of present illness Patient is a 66 y/o male who presents on 5/1 with SOB and chest pain as well as URI symptoms. Found to have sepsis secondary to PNA and bil large pleural effusions s/p thoracentesis 5/2. PMH includes dementia, depression, anxiety, CVA, CAD, CHF, MI, suicide attempt. ?  ?OT comments ? Patient received in bed and agreeable to OT session. Once on EOB patient was found to have had a BM in bed. Patient transferred to [facility] and performed bathing seated on commode and stood for peri area cleaning and toilet hygiene. Patient was able to transfer to recliner with RW and min assist. Patient was mod assist to donn pull up brief with patient assisting with pulling up while standing. Acute OT to continue to follow.   ? ?Recommendations for follow up therapy are one component of a multi-disciplinary discharge planning process, led by the attending physician.  Recommendations may be updated based on patient status, additional functional criteria and insurance authorization. ?   ?Follow Up Recommendations ? Skilled nursing-short term rehab (<3 hours/day)  ?  ?Assistance Recommended at Discharge Frequent or constant Supervision/Assistance  ?Patient can return home with the following ? A little help with walking and/or transfers;A little help with bathing/dressing/bathroom;Assistance with cooking/housework ?  ?Equipment Recommendations ? None recommended by OT  ?  ?Recommendations for Other Services   ? ?  ?Precautions / Restrictions Precautions ?Precautions: Fall ?Restrictions ?Weight Bearing Restrictions: No  ? ? ?  ? ?Mobility Bed Mobility ?Overal bed mobility: Needs Assistance ?Bed Mobility: Supine to Sit ?  ?  ?Supine to sit: Min assist ?  ?  ?General bed mobility comments: assistance to raise trunk ?  ? ?Transfers ?Overall transfer level: Needs assistance ?Equipment used:  Rolling walker (2 wheels), 1 person hand held assist ?Transfers: Sit to/from Stand, Bed to chair/wheelchair/BSC ?Sit to Stand: Min assist ?  ?  ?Step pivot transfers: Min assist ?  ?  ?General transfer comment: hand held assist to transfer to [facility] and RW used to transfer to recliner ?  ?  ?Balance Overall balance assessment: Needs assistance ?Sitting-balance support: No upper extremity supported, Feet supported ?Sitting balance-Leahy Scale: Fair ?  ?  ?Standing balance support: Single extremity supported, Bilateral upper extremity supported, Reliant on assistive device for balance ?Standing balance-Leahy Scale: Poor ?Standing balance comment: Requires UE support ?  ?  ?  ?  ?  ?  ?  ?  ?  ?  ?  ?   ? ?ADL either performed or assessed with clinical judgement  ? ?ADL Overall ADL's : Needs assistance/impaired ?  ?  ?Grooming: Wash/dry hands;Wash/dry face;Set up;Sitting ?  ?Upper Body Bathing: Sitting;Set up;Supervision/ safety ?  ?Lower Body Bathing: Moderate assistance;Sit to/from stand ?Lower Body Bathing Details (indicate cue type and reason): required increased assistance for peri area due to BM ?Upper Body Dressing : Minimal assistance;Sitting ?Upper Body Dressing Details (indicate cue type and reason): changed gown ?Lower Body Dressing: Moderate assistance;Sit to/from stand ?Lower Body Dressing Details (indicate cue type and reason): to donn socks and pull up brief ?Toilet Transfer: Minimal assistance;BSC/3in1 ?Toilet Transfer Details (indicate cue type and reason): stand pivot from EOB to [facility] ?Toileting- Clothing Manipulation and Hygiene: Moderate assistance;Sit to/from stand ?Toileting - Clothing Manipulation Details (indicate cue type and reason): due to have had a BM in bed ?  ?  ?  ?General ADL Comments: verbal  cues for sequencing increased assistance for peri area cleaning due to BM in bed ?  ? ?Extremity/Trunk Assessment   ?  ?  ?  ?  ?  ? ?Vision   ?  ?  ?Perception   ?  ?Praxis   ?  ? ?Cognition  Arousal/Alertness: Awake/alert ?Behavior During Therapy: [facility] for tasks assessed/performed ?Overall Cognitive Status: Impaired/Different from baseline ?Area of Impairment: Attention, Memory, Following commands, Safety/judgement, Awareness, Problem solving ?  ?  ?  ?  ?  ?  ?  ?  ?  ?Current Attention Level: Sustained ?Memory: Decreased short-term memory ?Following Commands: Follows one step commands consistently, Follows multi-step commands with increased time ?Safety/Judgement: Decreased awareness of deficits ?Awareness: Intellectual ?Problem Solving: Slow processing ?General Comments: frequent cues for safety, very cooperative ?  ?  ?   ?Exercises   ? ?  ?Shoulder Instructions   ? ? ?  ?General Comments    ? ? ?Pertinent Vitals/ Pain       Pain Assessment ?Pain Assessment: No/denies pain ?Pain Intervention(s): Monitored during session ? ?Home Living   ?  ?  ?  ?  ?  ?  ?  ?  ?  ?  ?  ?  ?  ?  ?  ?  ?  ?  ? ?  ?Prior Functioning/Environment    ?  ?  ?  ?   ? ?Frequency ? Min 2X/week  ? ? ? ? ?  ?Progress Toward Goals ? ?OT Goals(current goals can now be found in the care plan section) ? Progress towards OT goals: Progressing toward goals ? ?Acute Rehab OT Goals ?Patient Stated Goal: get better ?OT Goal Formulation: With patient ?Time For Goal Achievement: 11/13/21 ?Potential to Achieve Goals: Good ?ADL Goals ?Pt Will Perform Grooming: with supervision;standing ?Pt Will Perform Upper Body Bathing: with supervision;sitting ?Pt Will Perform Lower Body Bathing: sit to/from stand ?Pt Will Perform Upper Body Dressing: with supervision;sitting ?Pt Will Perform Lower Body Dressing: with supervision;sit to/from stand ?Pt Will Transfer to Toilet: with supervision;ambulating ?Pt Will Perform Toileting - Clothing Manipulation and hygiene: with supervision;sit to/from stand  ?Plan Discharge plan remains appropriate   ? ?Co-evaluation ? ? ?   ?  ?  ?  ?  ? ?  ?AM-PAC OT "6 Clicks" Daily Activity     ?Outcome Measure ? ? Help from  another person eating meals?: None ?Help from another person taking care of personal grooming?: A Little ?Help from another person toileting, which includes using toliet, bedpan, or urinal?: A Lot ?Help from another person bathing (including washing, rinsing, drying)?: A Lot ?Help from another person to put on and taking off regular upper body clothing?: A Little ?Help from another person to put on and taking off regular lower body clothing?: A Lot ?6 Click Score: 16 ? ?  ?End of Session Equipment Utilized During Treatment: Rolling walker (2 wheels) ? ?OT Visit Diagnosis: Unsteadiness on feet (R26.81) ?  ?Activity Tolerance Patient tolerated treatment well ?  ?Patient Left in chair;with call bell/phone within reach;with chair alarm set ?  ?Nurse Communication Mobility status ?  ? ?   ? ?Time: 1610-9604 ?OT Time Calculation (min): 39 min ? ?Charges: OT General Charges ?$OT Visit: 1 Visit ?OT Treatments ?$Self Care/Home Management : 38-52 mins ? ?Lodema Hong, OTA ?Acute Rehabilitation Services  ?Pager [phone] ?Office [phone] ? ? ?Trixie Dredge ?11/05/2021, 9:38 AM ?

## 2021-11-06 LAB — VITAMIN A: Vitamin A (Retinoic Acid): 28 ug/dL (ref 22.0–69.5)

## 2021-11-13 ENCOUNTER — Encounter (HOSPITAL_COMMUNITY): Payer: Self-pay | Admitting: Cardiovascular Disease

## 2021-11-28 ENCOUNTER — Telehealth (HOSPITAL_COMMUNITY): Payer: Self-pay | Admitting: Cardiovascular Disease

## 2021-11-28 NOTE — Telephone Encounter (Signed)
Just an FYI. We have made several attempts to contact this patient including sending a letter to schedule or reschedule their Myoview. We will be removing the patient from the echo/nuc WQ.   11/13/21 MAILED LETTER -LWB  11/13/21 [facility] to schedule @ 9:53/LBW  11/11/21 [facility] to schedule @ 10:10/LBW  11/08/21 [facility] to schedule @ 3:37/LBW  11/05/21 STILL IN HOSP       Thank you

## 2021-12-02 ENCOUNTER — Encounter: Payer: Self-pay | Admitting: Pulmonary Disease

## 2021-12-02 ENCOUNTER — Ambulatory Visit (INDEPENDENT_AMBULATORY_CARE_PROVIDER_SITE_OTHER): Payer: Medicare HMO | Admitting: Pulmonary Disease

## 2021-12-02 VITALS — BP 118/76 | HR 82

## 2021-12-02 DIAGNOSIS — G4734 Idiopathic sleep related nonobstructive alveolar hypoventilation: Secondary | ICD-10-CM | POA: Diagnosis not present

## 2021-12-02 DIAGNOSIS — J9601 Acute respiratory failure with hypoxia: Secondary | ICD-10-CM | POA: Diagnosis not present

## 2021-12-02 DIAGNOSIS — J449 Chronic obstructive pulmonary disease, unspecified: Secondary | ICD-10-CM | POA: Diagnosis not present

## 2021-12-02 MED ORDER — STIOLTO RESPIMAT 2.5-2.5 MCG/ACT IN AERS: 2.0000 | INHALATION_SPRAY | Freq: Every day | RESPIRATORY_TRACT | 4 refills | Status: DC

## 2021-12-02 MED ORDER — STIOLTO RESPIMAT 2.5-2.5 MCG/ACT IN AERS: 2.0000 | INHALATION_SPRAY | Freq: Every day | RESPIRATORY_TRACT | 11 refills | Status: DC

## 2021-12-02 NOTE — Progress Notes (Unsigned)
Synopsis: Referred in June 2023 for hospital follow up  Subjective:   PATIENT ID: Donald [name] GENDER: male DOB: [birth year], MRN: [id number]   HPI  Chief Complaint  Patient presents with   Hospitalization Follow-up    HFU for resp.failure. States he has been doing well since being discharged. Only using O2 at night.    Donald [name] is a 66 year old male, former smoker with coronary artery disease, HFrEF 25-40%, CVA, GERD, CKDIIIa and depression who is referred to pulmonary clinic for hospital follow up.   He was admitted 5/2 to 5/9 for aspiration pneumonia and heart failure. He was noted to have new EF of 25-30%. He had bilateral pleural effusions s/p thoracentesis which are transudative in nature. Cytology was negative for malignancy. He was unable to have heart cath inpatient and is planning for outpatient ischemic workup.   He is feeling much better since admission. He is only using oxygen at night when sleeping. He is using albuterol 3 times per day with relief.   MBS study shows oropharyngeal phase dysphagia and moderate aspiration risk.   He is currently at a skilled nursing facility.  Past Medical History:  Diagnosis Date   Cataract    Cerebral aneurysm without rupture    Followed by Dr. Melrose Nakayama, on Plavix   CHF (congestive heart failure) ([place])    Clostridium difficile colitis 05/2014   Presented with sepsis, required stool transplant   Coronary artery disease    Depression    Diverticulosis 11/23/2014   GERD (gastroesophageal reflux disease)    Hypothyroidism    Myocardial infarction ([place])    2009, 2013,2017   Stroke ([place])    NOV 2015   Suicide attempt ([place])      Family History  Problem Relation Age of Onset   Cancer Mother    Stroke Mother    Heart murmur Father    Stroke Father    Heart disease Sister    Stroke Sister    Stroke Brother    Heart disease Maternal Grandmother    Heart disease Maternal Grandfather    Heart disease Paternal  Grandmother    Heart disease Paternal Grandfather    Cancer Brother    Colon cancer Neg Hx    Esophageal cancer Neg Hx    Stomach cancer Neg Hx    Rectal cancer Neg Hx      Social History   Socioeconomic History   Marital status: Single    Spouse name: Not on file   Number of children: 0   Years of education: Not on file   Highest education level: Not on file  Occupational History   Occupation: disabled  Tobacco Use   Smoking status: Former    Packs/day: 1.00    Years: 25.00    Pack years: 25.00    Types: Cigarettes    Quit date: 06/03/2008    Years since quitting: 13.5   Smokeless tobacco: Never  Vaping Use   Vaping Use: Never used  Substance and Sexual Activity   Alcohol use: Yes    Comment: rare use, h/o heavy use   Drug use: No   Sexual activity: Not on file  Other Topics Concern   Not on file  Social History Narrative   Not on file   Social Determinants of Health   Financial Resource Strain: Not on file  Food Insecurity: Not on file  Transportation Needs: Not on file  Physical Activity: Not on file  Stress: Not on  file  Social Connections: Not on file  Intimate Partner Violence: Not on file     Allergies  Allergen Reactions   Penicillins Shortness Of Breath and Rash    Has patient had a PCN reaction causing immediate rash, facial/tongue/throat swelling, SOB or lightheadedness with hypotension: Yes Has patient had a PCN reaction causing severe rash involving mucus membranes or skin necrosis: No Has patient had a PCN reaction that required hospitalization: No Has patient had a PCN reaction occurring within the last 10 years: Yes If all of the above answers are "NO", then may proceed with Cephalosporin use.    Sulfa Antibiotics Hives, Shortness Of Breath and Rash   Adhesive [Tape] Other (See Comments)    When removed, bad bruises result   Tramadol Nausea And Vomiting     Outpatient Medications Prior to Visit  Medication Sig Dispense Refill    albuterol (VENTOLIN HFA) 108 (90 Base) MCG/ACT inhaler Inhale 2 puffs into the lungs every 4 (four) hours as needed for wheezing or shortness of breath.     apixaban (ELIQUIS) 2.5 MG TABS tablet Take 1 tablet (2.5 mg total) by mouth 2 (two) times daily.     atorvastatin (LIPITOR) 40 MG tablet TAKE 1 TABLET EVERY DAY (Patient taking differently: Take 40 mg by mouth daily.) 90 tablet 3   carvedilol (COREG) 6.25 MG tablet Take 1 tablet (6.25 mg total) by mouth 2 (two) times daily with a meal. 180 tablet 1   donepezil (ARICEPT) 10 MG tablet Take 10 mg by mouth at bedtime.     food thickener (SIMPLYTHICK, NECTAR/LEVEL 2/MILDLY THICK,) GEL Take 1 packet by mouth as needed (Take 1 packet by mouth as needed).     furosemide (LASIX) 40 MG tablet Take 1 tablet (40 mg total) by mouth daily.     gabapentin (NEURONTIN) 100 MG capsule Take 1 capsule (100 mg total) by mouth daily.     isosorbide mononitrate (IMDUR) 30 MG 24 hr tablet Take 1 tablet (30 mg total) by mouth daily. 90 tablet 1   mirtazapine (REMERON) 15 MG tablet Take 15 mg by mouth at bedtime.     Multiple Vitamin (MULTIVITAMIN WITH MINERALS) TABS tablet Take 1 tablet by mouth daily.     Nutritional Supplements (FEEDING SUPPLEMENT, NEPRO CARB STEADY,) LIQD Take 237 mLs by mouth 2 (two) times daily between meals.     potassium chloride SA (KLOR-CON M) 20 MEQ tablet Take 1 tablet (20 mEq total) by mouth daily.     tamsulosin (FLOMAX) 0.4 MG CAPS capsule TAKE 1 CAPSULE EVERY DAY (Patient taking differently: Take 0.4 mg by mouth daily.) 90 capsule 3   No facility-administered medications prior to visit.    Review of Systems  Constitutional:  Negative for chills, fever, malaise/fatigue and weight loss.  HENT:  Negative for congestion, sinus pain and sore throat.   Eyes: Negative.   Respiratory:  Negative for cough, hemoptysis, sputum production, shortness of breath and wheezing.   Cardiovascular:  Negative for chest pain, palpitations, orthopnea,  claudication and leg swelling.  Gastrointestinal:  Negative for abdominal pain, heartburn, nausea and vomiting.  Genitourinary: Negative.   Musculoskeletal:  Negative for joint pain and myalgias.  Skin:  Negative for rash.  Neurological:  Negative for weakness.  Endo/Heme/Allergies: Negative.   Psychiatric/Behavioral: Negative.       Objective:   Vitals:   12/02/21 1037  BP: 118/76  Pulse: 82  SpO2: 98%     Physical Exam Constitutional:  General: He is not in acute distress.    Appearance: He is underweight.  HENT:     Head: Normocephalic and atraumatic.  Eyes:     Extraocular Movements: Extraocular movements intact.     Conjunctiva/sclera: Conjunctivae normal.     Pupils: Pupils are equal, round, and reactive to light.  Cardiovascular:     Rate and Rhythm: Normal rate and regular rhythm.     Pulses: Normal pulses.     Heart sounds: Normal heart sounds. No murmur heard. Pulmonary:     Effort: Pulmonary effort is normal.     Breath sounds: Normal breath sounds. No wheezing or rhonchi.  Abdominal:     General: Bowel sounds are normal.     Palpations: Abdomen is soft.  Musculoskeletal:     Right lower leg: No edema.     Left lower leg: No edema.  Lymphadenopathy:     Cervical: No cervical adenopathy.  Skin:    General: Skin is warm and dry.  Neurological:     General: No focal deficit present.     Mental Status: He is alert.  Psychiatric:        Mood and Affect: Mood normal.        Behavior: Behavior normal.        Thought Content: Thought content normal.        Judgment: Judgment normal.   CBC    Component Value Date/Time   WBC 12.0 (H) 10/30/2021 0258   RBC 4.86 10/30/2021 0258   HGB 13.3 10/30/2021 0258   HGB 11.2 (L) 07/23/2017 1015   HCT 40.5 10/30/2021 0258   HCT 33.8 (L) 07/23/2017 1015   PLT 237 10/30/2021 0258   PLT 265 07/23/2017 1015   MCV 83.3 10/30/2021 0258   MCV 87 07/23/2017 1015   MCV 86 07/01/2014 0522   MCH 27.4 10/30/2021  0258   MCHC 32.8 10/30/2021 0258   RDW 13.6 10/30/2021 0258   RDW 13.7 07/23/2017 1015   RDW 14.9 (H) 07/01/2014 0522   LYMPHSABS 1.9 10/28/2021 0926   LYMPHSABS 1.1 07/01/2014 0522   MONOABS 0.7 10/28/2021 0926   MONOABS 0.7 07/01/2014 0522   EOSABS 0.1 10/28/2021 0926   EOSABS 0.2 07/01/2014 0522   BASOSABS 0.1 10/28/2021 0926   BASOSABS 0.0 07/01/2014 0522      Latest Ref Rng & Units 11/05/2021    2:29 AM 11/04/2021    1:33 AM 11/03/2021    7:31 AM  BMP  Glucose 70 - 99 mg/dL 88   88   98    BUN 8 - 23 mg/dL 30   31   33    Creatinine 0.61 - 1.24 mg/dL 1.68   1.62   1.72    Sodium 135 - 145 mmol/L 137   138   141    Potassium 3.5 - 5.1 mmol/L 4.0   3.6   3.8    Chloride 98 - 111 mmol/L 102   102   103    CO2 22 - 32 mmol/L '26   24   25    '$ Calcium 8.9 - 10.3 mg/dL 9.0   9.0   9.0     Chest imaging: CXR 11/01/21 Stable bibasilar atelectasis or edema with associated pleural effusions.  CTA Chest 10/28/21 Mediastinum/Nodes: No significant lymphadenopathy seen. There is evidence of previous coronary bypass surgery.   Lungs/Pleura: Moderate to large bilateral pleural effusions are seen more so on the right side. Small patchy infiltrates are seen in  the left upper lobe. There are moderate sized infiltrates in both lower lobes. Small patchy subpleural infiltrate is seen in the anterior right mid lung fields. Small patchy infiltrate is seen in the anteromedial aspect of right upper lobe.  PFT:     View : No data to display.          Labs:  Path:  Echo 10/29/21: LV EF 25-30%. Moderately dilated LV. RV systolic function is mildly reduced. RV size is normal.   Heart Catheterization:  Assessment & Plan:   Acute respiratory failure with hypoxia (HCC)  Nocturnal hypoxemia  Chronic obstructive pulmonary disease, unspecified COPD type ([place]) - Plan: Tiotropium Bromide-Olodaterol (STIOLTO RESPIMAT) 2.5-2.5 MCG/ACT AERS, DISCONTINUED: Tiotropium Bromide-Olodaterol (STIOLTO  RESPIMAT) 2.5-2.5 MCG/ACT AERS  Discussion: Donald [name] is a 66 year old male, former smoker with coronary artery disease, CVA, GERD, CKDIIIa and depression who is referred to pulmonary clinic for hospital follow up.   We will try patient on stiolto inhaler 2 puffs daily and monitor for any improvement in his dyspnea. Instructed patient that albuterol inhaler can be used as needed.   His respiratory failure has improved with treatment of his heart failure. The pulmonary edema and pleural effusions have resolved. He can continue oxygen therapy at night.   Follow up in 6 months.  Freda Jackson, MD [facility] Office: [phone]    Current Outpatient Medications:    albuterol (VENTOLIN HFA) 108 (90 Base) MCG/ACT inhaler, Inhale 2 puffs into the lungs every 4 (four) hours as needed for wheezing or shortness of breath., Disp: , Rfl:    apixaban (ELIQUIS) 2.5 MG TABS tablet, Take 1 tablet (2.5 mg total) by mouth 2 (two) times daily., Disp: , Rfl:    atorvastatin (LIPITOR) 40 MG tablet, TAKE 1 TABLET EVERY DAY (Patient taking differently: Take 40 mg by mouth daily.), Disp: 90 tablet, Rfl: 3   carvedilol (COREG) 6.25 MG tablet, Take 1 tablet (6.25 mg total) by mouth 2 (two) times daily with a meal., Disp: 180 tablet, Rfl: 1   donepezil (ARICEPT) 10 MG tablet, Take 10 mg by mouth at bedtime., Disp: , Rfl:    food thickener (SIMPLYTHICK, NECTAR/LEVEL 2/MILDLY THICK,) GEL, Take 1 packet by mouth as needed (Take 1 packet by mouth as needed)., Disp: , Rfl:    furosemide (LASIX) 40 MG tablet, Take 1 tablet (40 mg total) by mouth daily., Disp: , Rfl:    gabapentin (NEURONTIN) 100 MG capsule, Take 1 capsule (100 mg total) by mouth daily., Disp: , Rfl:    isosorbide mononitrate (IMDUR) 30 MG 24 hr tablet, Take 1 tablet (30 mg total) by mouth daily., Disp: 90 tablet, Rfl: 1   mirtazapine (REMERON) 15 MG tablet, Take 15 mg by mouth at bedtime., Disp: , Rfl:    Multiple Vitamin  (MULTIVITAMIN WITH MINERALS) TABS tablet, Take 1 tablet by mouth daily., Disp: , Rfl:    Nutritional Supplements (FEEDING SUPPLEMENT, NEPRO CARB STEADY,) LIQD, Take 237 mLs by mouth 2 (two) times daily between meals., Disp: , Rfl:    potassium chloride SA (KLOR-CON M) 20 MEQ tablet, Take 1 tablet (20 mEq total) by mouth daily., Disp: , Rfl:    tamsulosin (FLOMAX) 0.4 MG CAPS capsule, TAKE 1 CAPSULE EVERY DAY (Patient taking differently: Take 0.4 mg by mouth daily.), Disp: 90 capsule, Rfl: 3   Tiotropium Bromide-Olodaterol (STIOLTO RESPIMAT) 2.5-2.5 MCG/ACT AERS, Inhale 2 puffs into the lungs daily., Disp: 4 g, Rfl: 4

## 2021-12-02 NOTE — Patient Instructions (Signed)
Start stiolto inhaler 2 puffs daily  Use albuterol inhaler as needed, 1-2 puffs every 4-6 hours  Continue oxygen when sleeping at night  Follow up in 6 months

## 2021-12-03 ENCOUNTER — Encounter: Payer: Self-pay | Admitting: Pulmonary Disease

## 2021-12-16 ENCOUNTER — Encounter (HOSPITAL_COMMUNITY): Payer: Self-pay

## 2021-12-16 ENCOUNTER — Other Ambulatory Visit: Payer: Self-pay

## 2021-12-16 ENCOUNTER — Inpatient Hospital Stay (HOSPITAL_COMMUNITY)
Admission: EM | Admit: 2021-12-16 | Discharge: 2021-12-19 | DRG: 292 | Disposition: A | Discharge status: Other Acute Inpatient Hospital | Payer: Medicare HMO | Source: Skilled Nursing Facility | Attending: Family Medicine | Admitting: Family Medicine

## 2021-12-16 ENCOUNTER — Emergency Department (HOSPITAL_COMMUNITY): Payer: Medicare HMO

## 2021-12-16 DIAGNOSIS — I255 Ischemic cardiomyopathy: Secondary | ICD-10-CM | POA: Diagnosis present

## 2021-12-16 DIAGNOSIS — F32A Depression, unspecified: Secondary | ICD-10-CM | POA: Diagnosis present

## 2021-12-16 DIAGNOSIS — Z823 Family history of stroke: Secondary | ICD-10-CM

## 2021-12-16 DIAGNOSIS — Z87891 Personal history of nicotine dependence: Secondary | ICD-10-CM

## 2021-12-16 DIAGNOSIS — I482 Chronic atrial fibrillation, unspecified: Secondary | ICD-10-CM | POA: Diagnosis present

## 2021-12-16 DIAGNOSIS — Z681 Body mass index (BMI) 19 or less, adult: Secondary | ICD-10-CM | POA: Diagnosis not present

## 2021-12-16 DIAGNOSIS — N1831 Chronic kidney disease, stage 3a: Secondary | ICD-10-CM | POA: Diagnosis present

## 2021-12-16 DIAGNOSIS — Z951 Presence of aortocoronary bypass graft: Secondary | ICD-10-CM

## 2021-12-16 DIAGNOSIS — Z7951 Long term (current) use of inhaled steroids: Secondary | ICD-10-CM

## 2021-12-16 DIAGNOSIS — I5023 Acute on chronic systolic (congestive) heart failure: Secondary | ICD-10-CM | POA: Diagnosis present

## 2021-12-16 DIAGNOSIS — Z8249 Family history of ischemic heart disease and other diseases of the circulatory system: Secondary | ICD-10-CM

## 2021-12-16 DIAGNOSIS — J90 Pleural effusion, not elsewhere classified: Secondary | ICD-10-CM | POA: Diagnosis not present

## 2021-12-16 DIAGNOSIS — E039 Hypothyroidism, unspecified: Secondary | ICD-10-CM | POA: Diagnosis present

## 2021-12-16 DIAGNOSIS — J9601 Acute respiratory failure with hypoxia: Secondary | ICD-10-CM

## 2021-12-16 DIAGNOSIS — Z66 Do not resuscitate: Secondary | ICD-10-CM | POA: Diagnosis present

## 2021-12-16 DIAGNOSIS — Z7901 Long term (current) use of anticoagulants: Secondary | ICD-10-CM | POA: Diagnosis not present

## 2021-12-16 DIAGNOSIS — R1310 Dysphagia, unspecified: Secondary | ICD-10-CM | POA: Diagnosis not present

## 2021-12-16 DIAGNOSIS — Z79899 Other long term (current) drug therapy: Secondary | ICD-10-CM

## 2021-12-16 DIAGNOSIS — I2510 Atherosclerotic heart disease of native coronary artery without angina pectoris: Secondary | ICD-10-CM | POA: Diagnosis present

## 2021-12-16 DIAGNOSIS — E785 Hyperlipidemia, unspecified: Secondary | ICD-10-CM | POA: Diagnosis present

## 2021-12-16 DIAGNOSIS — K219 Gastro-esophageal reflux disease without esophagitis: Secondary | ICD-10-CM | POA: Diagnosis present

## 2021-12-16 DIAGNOSIS — E8779 Other fluid overload: Secondary | ICD-10-CM

## 2021-12-16 DIAGNOSIS — E861 Hypovolemia: Secondary | ICD-10-CM | POA: Diagnosis present

## 2021-12-16 DIAGNOSIS — R64 Cachexia: Secondary | ICD-10-CM | POA: Diagnosis present

## 2021-12-16 DIAGNOSIS — F01518 Vascular dementia, unspecified severity, with other behavioral disturbance: Secondary | ICD-10-CM | POA: Diagnosis present

## 2021-12-16 DIAGNOSIS — I252 Old myocardial infarction: Secondary | ICD-10-CM | POA: Diagnosis not present

## 2021-12-16 DIAGNOSIS — Z809 Family history of malignant neoplasm, unspecified: Secondary | ICD-10-CM | POA: Diagnosis not present

## 2021-12-16 DIAGNOSIS — K0889 Other specified disorders of teeth and supporting structures: Secondary | ICD-10-CM | POA: Diagnosis present

## 2021-12-16 DIAGNOSIS — F0150 Vascular dementia without behavioral disturbance: Secondary | ICD-10-CM | POA: Diagnosis not present

## 2021-12-16 DIAGNOSIS — Z8673 Personal history of transient ischemic attack (TIA), and cerebral infarction without residual deficits: Secondary | ICD-10-CM | POA: Diagnosis not present

## 2021-12-16 LAB — CBC
HCT: 39.2 % (ref 39.0–52.0)
Hemoglobin: 12 g/dL — ABNORMAL LOW (ref 13.0–17.0)
MCH: 26.9 pg (ref 26.0–34.0)
MCHC: 30.6 g/dL (ref 30.0–36.0)
MCV: 87.9 fL (ref 80.0–100.0)
Platelets: 397 10*3/uL (ref 150–400)
RBC: 4.46 MIL/uL (ref 4.22–5.81)
RDW: 14.6 % (ref 11.5–15.5)
WBC: 6.8 10*3/uL (ref 4.0–10.5)
nRBC: 0 % (ref 0.0–0.2)

## 2021-12-16 LAB — BASIC METABOLIC PANEL
Anion gap: 11 (ref 5–15)
BUN: 18 mg/dL (ref 8–23)
CO2: 23 mmol/L (ref 22–32)
Calcium: 8.8 mg/dL — ABNORMAL LOW (ref 8.9–10.3)
Chloride: 106 mmol/L (ref 98–111)
Creatinine, Ser: 1.29 mg/dL — ABNORMAL HIGH (ref 0.61–1.24)
GFR, Estimated: >60 mL/min (ref >60)
Glucose, Bld: 102 mg/dL — ABNORMAL HIGH (ref 70–99)
Potassium: 4.1 mmol/L (ref 3.5–5.1)
Sodium: 140 mmol/L (ref 135–145)

## 2021-12-16 LAB — TROPONIN I (HIGH SENSITIVITY)
Troponin I (High Sensitivity): 13 ng/L (ref <18)
Troponin I (High Sensitivity): 11 ng/L (ref <18)

## 2021-12-16 LAB — BRAIN NATRIURETIC PEPTIDE: B Natriuretic Peptide: 408.2 pg/mL — ABNORMAL HIGH (ref 0.0–100.0)

## 2021-12-16 MED ORDER — SODIUM CHLORIDE 0.9% FLUSH
3.0000 mL | Freq: Two times a day (BID) | INTRAVENOUS | Status: DC
  Administered 2021-12-17 – 2021-12-19 (×6): 3 mL via INTRAVENOUS

## 2021-12-16 MED ORDER — ATORVASTATIN CALCIUM 40 MG PO TABS
40.0000 mg | ORAL_TABLET | Freq: Every day | ORAL | Status: DC
  Administered 2021-12-16 – 2021-12-18 (×3): 40 mg via ORAL
  Filled 2021-12-16 (×3): qty 1

## 2021-12-16 MED ORDER — UMECLIDINIUM BROMIDE 62.5 MCG/ACT IN AEPB
1.0000 | INHALATION_SPRAY | Freq: Every day | RESPIRATORY_TRACT | Status: DC
  Administered 2021-12-17 – 2021-12-19 (×3): 1 via RESPIRATORY_TRACT
  Filled 2021-12-16: qty 7

## 2021-12-16 MED ORDER — FUROSEMIDE 10 MG/ML IJ SOLN
40.0000 mg | Freq: Two times a day (BID) | INTRAMUSCULAR | Status: DC
  Administered 2021-12-17: 40 mg via INTRAVENOUS
  Filled 2021-12-16: qty 4

## 2021-12-16 MED ORDER — ALBUTEROL SULFATE (2.5 MG/3ML) 0.083% IN NEBU: 2.5000 mg | INHALATION_SOLUTION | RESPIRATORY_TRACT | Status: DC | PRN

## 2021-12-16 MED ORDER — FUROSEMIDE 10 MG/ML IJ SOLN
40.0000 mg | Freq: Once | INTRAMUSCULAR | Status: AC
  Administered 2021-12-16: 40 mg via INTRAVENOUS
  Filled 2021-12-16: qty 4

## 2021-12-16 MED ORDER — ACETAMINOPHEN 650 MG RE SUPP: 650.0000 mg | Freq: Four times a day (QID) | RECTAL | Status: DC | PRN

## 2021-12-16 MED ORDER — APIXABAN 2.5 MG PO TABS
2.5000 mg | ORAL_TABLET | Freq: Two times a day (BID) | ORAL | Status: DC
  Administered 2021-12-16 – 2021-12-19 (×6): 2.5 mg via ORAL
  Filled 2021-12-16 (×6): qty 1

## 2021-12-16 MED ORDER — ALBUTEROL SULFATE HFA 108 (90 BASE) MCG/ACT IN AERS
2.0000 | INHALATION_SPRAY | RESPIRATORY_TRACT | Status: DC | PRN
Start: 2021-12-16 — End: 2021-12-16

## 2021-12-16 MED ORDER — MIRTAZAPINE 7.5 MG PO TABS
15.0000 mg | ORAL_TABLET | Freq: Every day | ORAL | Status: DC
  Administered 2021-12-16 – 2021-12-18 (×3): 15 mg via ORAL
  Filled 2021-12-16: qty 2
  Filled 2021-12-16: qty 1
  Filled 2021-12-16: qty 2

## 2021-12-16 MED ORDER — ACETAMINOPHEN 325 MG PO TABS: 650.0000 mg | ORAL_TABLET | Freq: Four times a day (QID) | ORAL | Status: DC | PRN

## 2021-12-16 MED ORDER — PANTOPRAZOLE SODIUM 40 MG PO TBEC
40.0000 mg | DELAYED_RELEASE_TABLET | Freq: Every day | ORAL | Status: DC
  Administered 2021-12-17 – 2021-12-19 (×3): 40 mg via ORAL
  Filled 2021-12-16 (×3): qty 1

## 2021-12-16 MED ORDER — TAMSULOSIN HCL 0.4 MG PO CAPS
0.4000 mg | ORAL_CAPSULE | Freq: Every day | ORAL | Status: DC
  Administered 2021-12-17 – 2021-12-19 (×3): 0.4 mg via ORAL
  Filled 2021-12-16 (×3): qty 1

## 2021-12-16 MED ORDER — DONEPEZIL HCL 10 MG PO TABS
10.0000 mg | ORAL_TABLET | Freq: Every day | ORAL | Status: DC
Start: 2021-12-16 — End: 2021-12-19
  Administered 2021-12-16 – 2021-12-18 (×3): 10 mg via ORAL
  Filled 2021-12-16 (×3): qty 1

## 2021-12-16 MED ORDER — GABAPENTIN 100 MG PO CAPS
100.0000 mg | ORAL_CAPSULE | Freq: Every day | ORAL | Status: DC
  Administered 2021-12-17 – 2021-12-19 (×3): 100 mg via ORAL
  Filled 2021-12-16 (×3): qty 1

## 2021-12-16 MED ORDER — ISOSORBIDE MONONITRATE ER 30 MG PO TB24
30.0000 mg | ORAL_TABLET | Freq: Every day | ORAL | Status: DC
  Administered 2021-12-17 – 2021-12-19 (×3): 30 mg via ORAL
  Filled 2021-12-16 (×3): qty 1

## 2021-12-16 MED ORDER — ARFORMOTEROL TARTRATE 15 MCG/2ML IN NEBU
15.0000 ug | INHALATION_SOLUTION | Freq: Two times a day (BID) | RESPIRATORY_TRACT | Status: DC
  Administered 2021-12-16 – 2021-12-19 (×6): 15 ug via RESPIRATORY_TRACT
  Filled 2021-12-16 (×6): qty 2

## 2021-12-16 MED ORDER — SENNOSIDES-DOCUSATE SODIUM 8.6-50 MG PO TABS: 1.0000 | ORAL_TABLET | Freq: Every evening | ORAL | Status: DC | PRN

## 2021-12-16 NOTE — ED Notes (Signed)
Pt changed into clean brief and sheets changed
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
## 2021-12-16 NOTE — ED Provider Notes (Signed)
[facility] EMERGENCY DEPARTMENT Provider Note   CSN: [id number] Arrival date & time: 12/16/21  1058     History  Chief Complaint  Patient presents with   Shortness of Breath    Donald [name] is a 66 y.o. male.  66 year old male with a past medical history of prior cerebral aneurysm on Plavix, ischemic cardiomyopathy s/p CABG with HFrEF (20-25%), prior CVA and dementia presents to the ED from [place] home for several days of progressive shortness of breath.  Patient is a very poor historian and does not know why he is in the emergency room today.  With patient permission, I spoke with patient's brother over the telephone.  Brother states that over the last several days, patient has had difficulty ambulating even a few steps due to the degree of his trouble breathing.  He typically wears oxygen as needed but has had to wear this around-the-clock due to his symptoms.  Today, symptoms progressed and appeared similar to his prior heart attacks.  Brother states that the patient denied any chest pain with this.  He states that the patient is baseline confused ever since his hospitalization in May 2023.  The history is provided by a relative, the patient and medical records.       Home Medications Prior to Admission medications   Medication Sig Start Date End Date Taking? Authorizing Provider  albuterol (VENTOLIN HFA) 108 (90 Base) MCG/ACT inhaler Inhale 2 puffs into the lungs every 4 (four) hours as needed for wheezing or shortness of breath. 11/05/21  Yes Hongalgi, Lenis Dickinson, MD  apixaban (ELIQUIS) 2.5 MG TABS tablet Take 1 tablet (2.5 mg total) by mouth 2 (two) times daily. 11/05/21  Yes Hongalgi, Lenis Dickinson, MD  atorvastatin (LIPITOR) 40 MG tablet TAKE 1 TABLET EVERY DAY Patient taking differently: Take 40 mg by mouth at bedtime. 09/27/18  Yes Glenis Smoker, MD  donepezil (ARICEPT) 10 MG tablet Take 10 mg by mouth at bedtime.   Yes [provider]   furosemide (LASIX) 40 MG tablet Take 1 tablet (40 mg total) by mouth daily. 11/05/21  Yes Hongalgi, Lenis Dickinson, MD  gabapentin (NEURONTIN) 100 MG capsule Take 1 capsule (100 mg total) by mouth daily. 11/05/21  Yes Hongalgi, Lenis Dickinson, MD  isosorbide mononitrate (IMDUR) 30 MG 24 hr tablet Take 1 tablet (30 mg total) by mouth daily. 04/16/21  Yes Reino Bellis B, NP  loperamide (IMODIUM A-D) 2 MG tablet Take 2 mg by mouth 2 (two) times daily as needed for diarrhea or loose stools.   Yes [provider]  mirtazapine (REMERON) 15 MG tablet Take 15 mg by mouth at bedtime.   Yes [provider]  Multiple Vitamin (MULTIVITAMIN WITH MINERALS) TABS tablet Take 1 tablet by mouth daily. 11/06/21  Yes Hongalgi, Lenis Dickinson, MD  Nutritional Supplements (FEEDING SUPPLEMENT, NEPRO CARB STEADY,) LIQD Take 237 mLs by mouth 2 (two) times daily between meals. 11/05/21  Yes Hongalgi, Lenis Dickinson, MD  omeprazole (PRILOSEC) 20 MG capsule Take 20 mg by mouth daily before breakfast.   Yes [provider]  potassium chloride SA (KLOR-CON M) 20 MEQ tablet Take 1 tablet (20 mEq total) by mouth daily. 11/05/21  Yes Hongalgi, Lenis Dickinson, MD  tamsulosin (FLOMAX) 0.4 MG CAPS capsule TAKE 1 CAPSULE EVERY DAY Patient taking differently: Take 0.4 mg by mouth daily. 12/12/17  Yes Glenis Smoker, MD  Tiotropium Bromide-Olodaterol (STIOLTO RESPIMAT) 2.5-2.5 MCG/ACT AERS Inhale 2 puffs into the lungs  daily. 12/02/21  Yes Freddi Starr, MD  carvedilol (COREG) 6.25 MG tablet Take 1 tablet (6.25 mg total) by mouth 2 (two) times daily with a meal. Patient not taking: Reported on 12/16/2021 04/15/21   Reino Bellis B, NP  food thickener (SIMPLYTHICK, NECTAR/LEVEL 2/MILDLY THICK,) GEL Take 1 packet by mouth as needed (Take 1 packet by mouth as needed). Patient not taking: Reported on 12/16/2021 11/05/21   Modena Jansky, MD      Allergies    Misc. sulfonamide containing compounds, Penicillins, Sulfa antibiotics, Adhesive  [tape], and Tramadol    Review of Systems   Review of Systems  Unable to perform ROS: Dementia    Physical Exam Updated Vital Signs BP 114/66   Pulse 98   Temp (!) 97.4 F (36.3 C) (Oral)   Resp 16   Ht '5\' 3"'$  (1.6 m)   Wt 45.8 kg   SpO2 99%   BMI 17.89 kg/m  Physical Exam Vitals and nursing note reviewed.  Constitutional:      Appearance: He is cachectic. He is ill-appearing.     Comments: Appears older than stated age  HENT:     Mouth/Throat:     Pharynx: Oropharynx is clear.  Cardiovascular:     Rate and Rhythm: Regular rhythm. Tachycardia present.     Pulses:          Radial pulses are 2+ on the right side and 2+ on the left side.     Heart sounds: Murmur heard.  Pulmonary:     Effort: Tachypnea present.     Breath sounds: Examination of the right-middle field reveals rales. Examination of the left-middle field reveals rales. Examination of the right-lower field reveals decreased breath sounds and rales. Examination of the left-lower field reveals decreased breath sounds and rales. Decreased breath sounds and rales present.     Comments: Increased work of breathing on 2L [place] Musculoskeletal:     Right lower leg: No edema.     Left lower leg: No edema.  Neurological:     Mental Status: He is alert. He is confused.     GCS: GCS eye subscore is 4. GCS verbal subscore is 3. GCS motor subscore is 6.     ED Results / Procedures / Treatments   Labs (all labs ordered are listed, but only abnormal results are displayed) Labs Reviewed  BRAIN NATRIURETIC PEPTIDE - Abnormal; Notable for the following components:      Result Value   B Natriuretic Peptide 408.2 (*)    All other components within normal limits  CBC - Abnormal; Notable for the following components:   Hemoglobin 12.0 (*)    All other components within normal limits  BASIC METABOLIC PANEL - Abnormal; Notable for the following components:   Glucose, Bld 102 (*)    Creatinine, Ser 1.29 (*)    Calcium 8.8 (*)     All other components within normal limits  BASIC METABOLIC PANEL  MAGNESIUM  CBC  TROPONIN I (HIGH SENSITIVITY)  TROPONIN I (HIGH SENSITIVITY)    EKG None  Radiology DG Chest 2 View  Result Date: 12/16/2021 CLINICAL DATA:  Shortness of breath.  Congestive heart failure. EXAM: CHEST - 2 VIEW COMPARISON:  One-view chest x-ray 11/01/2021 FINDINGS: Heart is enlarged. Bilateral pleural effusions and edema have increased. Bibasilar airspace opacities are noted. IMPRESSION: Cardiomegaly with increasing bilateral pleural effusions and edema compatible with congestive heart failure. Electronically Signed   By: San Morelle M.D.   On:  12/16/2021 11:25    Procedures Procedures   Medications Ordered in ED Medications  atorvastatin (LIPITOR) tablet 40 mg (has no administration in time range)  isosorbide mononitrate (IMDUR) 24 hr tablet 30 mg (has no administration in time range)  donepezil (ARICEPT) tablet 10 mg (has no administration in time range)  mirtazapine (REMERON) tablet 15 mg (has no administration in time range)  pantoprazole (PROTONIX) EC tablet 40 mg (has no administration in time range)  tamsulosin (FLOMAX) capsule 0.4 mg (has no administration in time range)  apixaban (ELIQUIS) tablet 2.5 mg (has no administration in time range)  gabapentin (NEURONTIN) capsule 100 mg (has no administration in time range)  arformoterol (BROVANA) nebulizer solution 15 mcg (has no administration in time range)    And  umeclidinium bromide (INCRUSE ELLIPTA) 62.5 MCG/ACT 1 puff (has no administration in time range)  sodium chloride flush (NS) 0.9 % injection 3 mL (has no administration in time range)  acetaminophen (TYLENOL) tablet 650 mg (has no administration in time range)    Or  acetaminophen (TYLENOL) suppository 650 mg (has no administration in time range)  senna-docusate (Senokot-S) tablet 1 tablet (has no administration in time range)  furosemide (LASIX) injection 40 mg (has no  administration in time range)  albuterol (PROVENTIL) (2.5 MG/3ML) 0.083% nebulizer solution 2.5 mg (has no administration in time range)  furosemide (LASIX) injection 40 mg (40 mg Intravenous Given 12/16/21 1919)    ED Course/ Medical Decision Making/ A&P Clinical Course as of 12/16/21 2110  Mon Dec 16, 2021  1901 Spoke with brother Wolfe Camarena at [phone]. Patient name and DOB verified. Brother states that he was sent from SNF, concern for "attack". Gradual onset SOB over last few days, today suddenly worsened. Started wearing O2 all the time at the facility. Concern for heart attack. [JC]    Clinical Course User Index [JC] Anel Creighton, [place], MD                           Medical Decision Making Amount and/or Complexity of Data Reviewed Independent Historian:     Details: Brother, SNF records External Data Reviewed: labs, radiology, ECG and notes. Labs: ordered. Radiology: ordered and independent interpretation performed. ECG/medicine tests: ordered and independent interpretation performed.  Risk Prescription drug management. Decision regarding hospitalization.   66 year old male with a history as above presents today with several days of shortness of breath.  On arrival, patient is ill-appearing, cachectic and tachycardic.  He is tachypneic but satting appropriately on 2 L nasal cannula.  I reviewed the patient's past medical history which is notable for an extensive hospitalization for pneumonia in May 2023.  He additionally has severe systolic heart failure and has frequent admissions for CHF exacerbations.  I reviewed the patient's labs today which are largely reassuring, no significant electrolyte abnormalities.  His kidney function is improved from prior.  His BNP is slightly elevated and his chest x-ray is notable for increasing bilateral pleural effusions and pulmonary edema.  Troponin stable and reassuring at 13 -> 11.  At this point, clinical picture appears most  consistent with likely CHF exacerbation and underlying volume overload.  We will plan for dose of IV diuresis in the ED. I have a low suspicion for ACS with his reassuring troponins.  EKG largely similar to prior.  Low suspicion for PE as patient is currently anticoagulated on Eliquis.  I spoke with the inpatient medicine team for admission, agreeable to accept the patient to  their service.  Plan of care has been discussed with the patient's brother over the telephone who verbalized understanding.  No further acute events or interventions prior to transition of care to the medicine team.  Final Clinical Impression(s) / ED Diagnoses Final diagnoses:  Acute on chronic systolic congestive heart failure ([place])  Volume overload state of heart    Rx / DC Orders ED Discharge Orders     None         Marjon Doxtater, [place], MD 12/16/21 2110    Elnora Morrison, MD 12/16/21 2339

## 2021-12-16 NOTE — ED Triage Notes (Signed)
Has PNA in may, reports increased sob when walking around.  He thinks they are giving him too much oxygen.  Patient is at [facility] for rehab.    Currently on 3L [place] all the time.

## 2021-12-16 NOTE — ED Notes (Signed)
Pt refused repeat trop states wants to leave nurse informed

## 2021-12-17 ENCOUNTER — Other Ambulatory Visit (HOSPITAL_COMMUNITY): Payer: Self-pay

## 2021-12-17 ENCOUNTER — Encounter (HOSPITAL_COMMUNITY): Payer: Self-pay | Admitting: Family Medicine

## 2021-12-17 DIAGNOSIS — I5023 Acute on chronic systolic (congestive) heart failure: Secondary | ICD-10-CM | POA: Diagnosis not present

## 2021-12-17 DIAGNOSIS — I2510 Atherosclerotic heart disease of native coronary artery without angina pectoris: Secondary | ICD-10-CM | POA: Diagnosis not present

## 2021-12-17 DIAGNOSIS — I482 Chronic atrial fibrillation, unspecified: Secondary | ICD-10-CM | POA: Diagnosis not present

## 2021-12-17 DIAGNOSIS — Z8673 Personal history of transient ischemic attack (TIA), and cerebral infarction without residual deficits: Secondary | ICD-10-CM | POA: Diagnosis not present

## 2021-12-17 LAB — BASIC METABOLIC PANEL
Anion gap: 16 — ABNORMAL HIGH (ref 5–15)
BUN: 19 mg/dL (ref 8–23)
CO2: 24 mmol/L (ref 22–32)
Calcium: 9.5 mg/dL (ref 8.9–10.3)
Chloride: 103 mmol/L (ref 98–111)
Creatinine, Ser: 1.47 mg/dL — ABNORMAL HIGH (ref 0.61–1.24)
GFR, Estimated: 52 mL/min — ABNORMAL LOW (ref >60)
Glucose, Bld: 88 mg/dL (ref 70–99)
Potassium: 4.1 mmol/L (ref 3.5–5.1)
Sodium: 143 mmol/L (ref 135–145)

## 2021-12-17 LAB — CBC
HCT: 38.9 % — ABNORMAL LOW (ref 39.0–52.0)
Hemoglobin: 12.8 g/dL — ABNORMAL LOW (ref 13.0–17.0)
MCH: 26.9 pg (ref 26.0–34.0)
MCHC: 32.9 g/dL (ref 30.0–36.0)
MCV: 81.7 fL (ref 80.0–100.0)
Platelets: 370 10*3/uL (ref 150–400)
RBC: 4.76 MIL/uL (ref 4.22–5.81)
RDW: 14.5 % (ref 11.5–15.5)
WBC: 6.5 10*3/uL (ref 4.0–10.5)
nRBC: 0 % (ref 0.0–0.2)

## 2021-12-17 LAB — MAGNESIUM: Magnesium: 1.9 mg/dL (ref 1.7–2.4)

## 2021-12-17 LAB — MRSA NEXT GEN BY PCR, NASAL: MRSA by PCR Next Gen: NOT DETECTED

## 2021-12-17 MED ORDER — ONDANSETRON HCL 4 MG/2ML IJ SOLN
4.0000 mg | Freq: Four times a day (QID) | INTRAMUSCULAR | Status: DC | PRN
  Administered 2021-12-17: 4 mg via INTRAVENOUS
  Filled 2021-12-17: qty 2

## 2021-12-17 MED ORDER — DAPAGLIFLOZIN PROPANEDIOL 10 MG PO TABS
10.0000 mg | ORAL_TABLET | Freq: Every day | ORAL | Status: DC
  Filled 2021-12-17: qty 1

## 2021-12-17 MED ORDER — ORAL CARE MOUTH RINSE: 15.0000 mL | OROMUCOSAL | Status: DC | PRN

## 2021-12-17 MED ORDER — ORAL CARE MOUTH RINSE
15.0000 mL | OROMUCOSAL | Status: DC
  Administered 2021-12-17 – 2021-12-19 (×7): 15 mL via OROMUCOSAL

## 2021-12-17 NOTE — Progress Notes (Signed)
Mobility Specialist Progress Note    12/17/21 1119  Mobility  Activity Ambulated with assistance in hallway  Level of Assistance Contact guard assist, steadying assist  Assistive Device Front wheel walker  Distance Ambulated (ft) 340 ft  Activity Response Tolerated well  $Mobility charge 1 Mobility   Pre-Mobility: 91 HR, 97% SpO2 During Mobility: 106 HR Post-Mobility: 88 HR, 98% SpO2  Pt received in bed and agreeable. No complaints on walk. Started to shuffle more as he got tired. Returned to chair with call bell in reach. RN aware.   Hildred Alamin Mobility Specialist

## 2021-12-17 NOTE — Plan of Care (Signed)
  Problem: Education: Goal: Ability to demonstrate management of disease process will improve Outcome: Progressing   Problem: Clinical Measurements: Goal: Diagnostic test results will improve Outcome: Progressing

## 2021-12-17 NOTE — Assessment & Plan Note (Signed)
No agitation.  Continue with donepezil and mirtazapine.    Swallow dysfunction, speech therapy consulted with recommendations to continue regular diet.

## 2021-12-17 NOTE — Hospital Course (Signed)
[name] was admitted to the hospital with the working diagnosis of decompensated heart failure.   66 yo male with the past medical history of systolic heart failure, atrial fibrillation, CVA, CKD and dementia who presented with dyspnea. Patient developed progressive and worsening dyspnea, to the point where he was symptomatic with minimal efforts. On his initial physical examination his blood pressure was 101/61, HT 87, RR 22 and 02 saturation 99%, lungs with bilateral rales but not wheezing, heart with S1 and S2 present and rhythmic, abdomen not distended and no lower extremity edema.   Na 140, K 4,1 Cl 106, bicarbonate 23, glucose 102 bun 18 cr 1,29 BNP 408  High sensitive troponin 13 and 11  Wbc 6,8 hgb 12 plt 397 \  Chest radiograph with cardiomegaly, bilateral interstitial infiltrates predominantly at the lower lobes with positive cephalization of the vasculature, positive bilateral pleural effusions.   EKG 95 bpm, right axis deviation, normal intervals, sinus rhythm with poor R wave progression, no significant ST segment changes, negative T wave lead II, III, AvF, V5 and V6.   Patient was placed on furosemide for diuresis.   Swallow dysfunction, speech therapy was consulted.

## 2021-12-17 NOTE — Progress Notes (Signed)
Progress Note   Patient: Donald [name]:[id number] DOB: 1956/03/17 DOA: 12/16/2021     1 DOS: the patient was seen and examined on 12/17/2021   Brief hospital course: Donald [name] was admitted to the hospital with the working diagnosis of decompensated heart failure.   66 yo male with the past medical history of systolic heart failure, atrial fibrillation, CVA, CKD and dementia who presented with dyspnea. Patient developed progressive and worsening dyspnea, to the point where he was symptomatic with minimal efforts. On his initial physical examination his blood pressure was 101/61, HT 87, RR 22 and 02 saturation 99%, lungs with bilateral rales but not wheezing, heart with S1 and S2 present and rhythmic, abdomen not distended and no lower extremity edema.   Na 140, K 4,1 Cl 106, bicarbonate 23, glucose 102 bun 18 cr 1,29 BNP 408  High sensitive troponin 13 and 11  Wbc 6,8 hgb 12 plt 397 \  Chest radiograph with cardiomegaly, bilateral interstitial infiltrates predominantly at the lower lobes with positive cephalization of the vasculature, positive bilateral pleural effusions.   EKG 95 bpm, right axis deviation, normal intervals, sinus rhythm with poor R wave progression, no significant ST segment changes, negative T wave lead II, III, AvF, V5 and V6.   Patient was placed on furosemide for diuresis.   Swallow dysfunction, speech therapy was consulted.   Assessment and Plan: * Acute on chronic systolic CHF (congestive heart failure) ([place]) 10/2021 echocardiogram with reduced LV systolic function with EF 25 to 30%, mild reduction in RV systolic pressure. LV entire apex and entire inferior wall akinetic. Antero lateral wall and posterior wall hypokinetic.   Documented urine output is 600 cc. Clinically patient with no edema, no JVD and no gallops.  Plan to hold on diuretic therapy for now.  Follow up volume status in am. Continue with isosorbide.   Acute hypoxemic respiratory failure  due to cardiogenic pulmonary edema.  Bilateral pleural effusion.   Follow up chest radiograph in am.  If persistent effusions, to consider thoracentesis.   Chronic a-fib (HCC) Patient sinus rhythm on EKG Continue telemetry monitoring and anticoagulation with apixaban.   History of stroke Continue anticoagulation with apixaban. Follow up with PT and OT evaluation.   Dyslipidemia, continue with statin therapy.   Coronary artery disease Patient had CABG in the past.  Continue statin therapy and isosorbide.  Continue blood pressure monitoring.   Chronic kidney disease, stage 3a ([place]) Renal function with serum cr at 1,47,. K is 4,1 and serum bicarbonate at 24.  Plan to continue close monitoring of renal function and electrolytes.  Clinically patient is hypovolemic.  Hold on diuretic therapy for now.  Avoid hypotension and nephrotoxic medications.   Vascular dementia with behavior disturbance (HCC) No agitation.  Continue with donepezil and mirtazapine.    Swallow dysfunction, speech therapy consulted with recommendations to continue regular diet.         Subjective: Patient is feeling better, no dyspnea or chest pain, no nausea or vomiting.,  Physical Exam: Vitals:   12/17/21 0431 12/17/21 0804 12/17/21 1058 12/17/21 1119  BP: (!) 107/58 119/73  102/65  Pulse: 92 86  85  Resp: 20 (!) 21  20  Temp: 97.6 F (36.4 C)   97.6 F (36.4 C)  TempSrc: Oral   Oral  SpO2: 99% 99% 99% 98%  Weight:      Height:       Neurology awake and alert ENT with mild pallor Cardiovascular with S1 and S2  present and rhythmic with no gallops, rubs or murmurs Respiratory with no wheezing or rales Abdomen soft  No lower extremity edema  Data Reviewed:    Family Communication: no family at the bedside   Disposition: Status is: Inpatient Remains inpatient appropriate because: heart failure   Planned Discharge Destination: Home  Author: Tawni Millers, MD 12/17/2021 3:34  PM  For on call review [email or web address].
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
## 2021-12-17 NOTE — Progress Notes (Signed)
Speech Language Pathology Treatment: Dysphagia  Patient Details Name: Donald [name] MRN: [id number] DOB: [birth year] Today's Date: 12/17/2021 Time: 1200-1210 SLP Time Calculation (min) (ACUTE ONLY): 10 min  Assessment / Plan / Recommendation Clinical Impression  RN stopped this SLP d/t pt requesting cheeseburger. Solid not attempted during swallow assessment earlier this am as pt is endentulous and stated that he couldn't chew well. Observed him with cracker and his function of mastication and manipulation was intact albeit slower. Thickened [place] to nectar which he tolerated well.  Recommend upgrade to regular, continue nectar thick and ST will follow for efficiency with texture or need to downgrade.    HPI HPI: Donald [name] is a pleasant 66 y.o. male with medical history significant for dementia, dysphagia, chronic systolic CHF, atrial fibrillation on Eliquis, CAD, history of CVA, and CKD 3A who presents to the emergency department with worsening shortness of breath.  Per chart patient's brother raises concern that his liquids are not being thickened consistently at the nursing facility. Chest x-ray concerning for cardiomegaly with increased bilateral pleural effusions and edema consistent with heart failure. MBS 11/04/2021 revealed moderate oropharyngeal dysphagia with anatomical impact marked by multiple and significant cervical fusions and cervical rigidity; Dys 2/nectar recommended.      SLP Plan  Continue with current plan of care      Recommendations for follow up therapy are one component of a multi-disciplinary discharge planning process, led by the attending physician.  Recommendations may be updated based on patient status, additional functional criteria and insurance authorization.    Recommendations  Diet recommendations: Regular;Nectar-thick liquid Liquids provided via: Cup;Straw Medication Administration: Whole meds with puree Supervision: Staff to assist with self  feeding (if needed) Compensations: Minimize environmental distractions;Slow rate;Small sips/bites Postural Changes and/or Swallow Maneuvers: Seated upright 90 degrees                Oral Care Recommendations: Oral care BID Follow Up Recommendations: Skilled nursing-short term rehab (<3 hours/day) Assistance recommended at discharge: Frequent or constant Supervision/Assistance SLP Visit Diagnosis: Dysphagia, unspecified (R13.10) Plan: Continue with current plan of care           Houston Siren  12/17/2021, 12:48 PM

## 2021-12-17 NOTE — Progress Notes (Signed)
Heart Failure Nurse Navigator Progress Note  PCP: Pcp, No PCP-Cardiologist: Nishan Admission Diagnosis: Acute on chronic systolic congestive heart failure, Volume overload.  Admitted from: [facility]  Presentation:   Donald [name] presented with shortness of breath, difficulty ambulating, BP 114/66, HR 98, BNP 408, IV lasix given, placed on 2 L [place], CXR concern for cardiomegaly, with increased bilateral pleural effusions and edema .   Patient was interviewed and was able to answer all questions correctly. He was educated on the sign and symptoms of heart failure, daily weights ( patient states they weigh him daily at [facility]). Diet/ fluid restrictions, he states he has been drinking soda every day there, and will need to quit, taking all his medications as prescribed, and attending all his medical appointments. Patient voiced his understanding. A hospital follow up with HF TOC is scheduled for 01/01/2022.   ECHO/ LVEF: 25-30 % HFrEF  Clinical Course:  Past Medical History:  Diagnosis Date   Cataract    Cerebral aneurysm without rupture    Followed by Dr. Melrose Nakayama, on Plavix   CHF (congestive heart failure) ([place])    Clostridium difficile colitis 05/2014   Presented with sepsis, required stool transplant   Coronary artery disease    Depression    Diverticulosis 11/23/2014   GERD (gastroesophageal reflux disease)    Hypothyroidism    Myocardial infarction ([place])    2009, 2013,2017   Stroke ([place])    NOV 2015   Suicide attempt [facility])      Social History   Socioeconomic History   Marital status: Single    Spouse name: Not on file   Number of children: 0   Years of education: Not on file   Highest education level: Not on file  Occupational History   Occupation: disabled  Tobacco Use   Smoking status: Former    Packs/day: 1.00    Years: 25.00    Total pack years: 25.00    Types: Cigarettes    Quit date: 06/03/2008    Years since quitting: 13.5   Smokeless tobacco:  Never  Vaping Use   Vaping Use: Never used  Substance and Sexual Activity   Alcohol use: Yes    Comment: rare use, h/o heavy use   Drug use: No   Sexual activity: Not Currently  Other Topics Concern   Not on file  Social History Narrative   Not on file   Social Determinants of Health   Financial Resource Strain: Not on file  Food Insecurity: Not on file  Transportation Needs: Not on file  Physical Activity: Not on file  Stress: Not on file  Social Connections: Not on file   Education Assessment and Provision:  Detailed education and instructions provided on heart failure disease management including the following:  Signs and symptoms of Heart Failure When to call the physician Importance of daily weights Low sodium diet Fluid restriction Medication management Anticipated future follow-up appointments  Patient education given on each of the above topics.  Patient acknowledges understanding via teach back method and acceptance of all instructions.  Education Materials:  "Living Better With Heart Failure" Booklet, HF zone tool, & Daily Weight Tracker Tool.  Patient has scale at home: yes, gets weighed daily at [facility].  Patient has pill box at home: NA   High Risk Criteria for Readmission and/or Poor Patient Outcomes: Heart failure hospital admissions (last 6 months):  1 No Show rate: 4 % Difficult social situation: No Demonstrates medication adherence: Yes  Primary Language: English Literacy level: reading, and writing.   Barriers of Care:   Diet/ fluid restrictions [place])  Considerations/Referrals:   Referral made to Heart Failure Pharmacist Stewardship: Yes Referral made to Heart Failure CSW/NCM TOC: No  Referral made to Heart & Vascular TOC clinic: Yes, 01/01/22  Items for Follow-up on DC/TOC: Diet/ fluid restrictions   Earnestine Leys, BSN, RN Heart Failure Transport planner Only

## 2021-12-17 NOTE — TOC Benefit Eligibility Note (Signed)
Patient Teacher, English as a foreign language completed.    The patient is currently admitted and upon discharge could be taking Farxiga 10 mg.  The current 30 day co-pay is, $95.00.   The patient is currently admitted and upon discharge could be taking Jardiance 10 mg.  The current 30 day co-pay is, $45.00.   The patient is currently admitted and upon discharge could be taking isosorbide-hydralazine (Bidil) 20-37.5 mg.  Product Not on Formulary will cover the individual components   The patient is insured through [place] Patient Advocate Specialist [place] Patient Advocate Team Direct Number: [phone]  Fax: [phone]

## 2021-12-17 NOTE — Progress Notes (Signed)
TRH night cross cover note:   I was notified by RN that this patient has failed the nursing bedside swallow screen.  he conveys a history of dysphagia, requiring a modified diet with thickener, but does not recall the specific nature of these dietary modifications or thickener type.   Subsequently, I have placed an order for SLP consult for formal swallow evaluation in the morning, and have made the patient n.p.o. in the interval.     Babs Bertin, DO Hospitalist

## 2021-12-17 NOTE — Assessment & Plan Note (Signed)
Patient had CABG in the past.  Continue statin therapy and isosorbide.  Continue blood pressure monitoring.

## 2021-12-17 NOTE — Progress Notes (Signed)
Heart Failure Stewardship Pharmacist Progress Note   PCP: Pcp, No PCP-Cardiologist: Sanda Klein, MD   HPI:  pleasant 66 y.o. male with medical history significant for dementia, chronic systolic CHF, atrial fibrillation on Eliquis, CAD, history of CVA, and CKD 3A who presents to the emergency department with worsening shortness of breath. Patient has been experiencing progressively worsening exertional dyspnea over the past several days, was unable to go more than a few steps with his walker on 12/13/2021 due to shortness of breath, and was dyspneic even at rest today.  Patient has supplemental oxygen to use as needed but has been requiring it around-the-clock the past few days.  Patient denies any chest pain or leg swelling.  He reports occasional cough that is mostly nonproductive.  Patient's brother raises concern that his liquids are not being thickened consistently at the nursing facility.  EF 25-30%, RV mildly reduced and mild MR on 10/2021 echo.   Upon arrival to the ED, patient was found to be afebrile, saturating mid 90s on 2 L/min of supplemental oxygen, tachypneic, and with systolic blood pressure of 93 and greater.  Chemistry panel notable for creatinine 1.29.  BNP is 408.  Troponin was normal x2.  Chest x-ray concerning for cardiomegaly with increased bilateral pleural effusions and edema consistent with heart failure.  Patient was given 40 mg IV Lasix in the ED.   Patient continues to receive IV diuresis, with possible need for thoracentesis if no improvement with diuresis.    Current HF Medications: Diuretic: furosemide 40 mg IV BID Other: imdur 30 daily  Prior to admission HF Medications: Diuretic: furosemide 40 mg daily Beta blocker: carvedilol listed but not on MAR for nursing home so assumed not given Other: Kcl 20 mEq daily, imdur 30 mg daily   Pertinent Lab Values: Serum creatinine 1.47, BUN 19, Potassium 4.1, Sodium 143, BNP 408, Magnesium 1.9, A1c pending   Vital  Signs: Weight: 113 lbs (admission weight: 113 lbs) Blood pressure: 100-120/65-75  Heart rate: 85-95 I/O: -685m yesterday; net -660 mL  Medication Assistance / Insurance Benefits Check: Does the patient have prescription insurance?  Yes Type of insurance plan: [place]  Outpatient Pharmacy:  [id number]? [facility], patient is from SNF and will return to SNF so medications will be facility administered Is the patient willing to transition their [facility] pharmacy [facility]?   No- nursing home resident   Assessment: 1. Acute on chronic systolic CHF (LVEF 212-24%, due to unclear etiology. NYHA class IV symptoms. -Continue furosemide 40 mg IV BID, may ned to increase dose tomorrow if UOP not improved. Scr stable -Continue PTA imdur 30 mg daily  -eGFR 52, check A1c, could benefit from addition of SGLT2i and spironolactone   Plan: 1) Medication changes recommended at this time: -Start Farxiga 10 mg daily  2) Patient assistance: -Copay Farxiga $95 -Copay Jardiance $45 -BiDil non-formulary, use individual components -Patient is from SNF and gets medications administered there, so does not need any patient assistance  3)  Education  - Patient/patient's brother has been educated on current HF medications and potential additions to HF medication regimen - Patient/patient's brother verbalizes understanding that over the next few months, these medication doses may change and more medications may be added to optimize HF regimen - Patient/ patient's brother has been educated on basic disease state pathophysiology and goals of  therapy  Cathrine Muster, PharmD, [facility] PGY2 Cardiology Pharmacy Resident

## 2021-12-17 NOTE — Assessment & Plan Note (Addendum)
--  10/2021 echocardiogram with reduced LV systolic function with EF 25 to 30%, mild reduction in RV systolic pressure.  -- Appears euvolemic.  Continue to hold diuretic.  Agree with starting carvedilol and [place].  Replace potassium to goal for greater and magnesium to goal 2 or greater. -- I find no documentation to support acute hypoxic respiratory failure, patient not on oxygen, no desaturation with PT.  Acute hypoxic respiratory failure ruled out.

## 2021-12-17 NOTE — Assessment & Plan Note (Signed)
Renal function with serum cr at 1,47,. K is 4,1 and serum bicarbonate at 24.  Plan to continue close monitoring of renal function and electrolytes.  Clinically patient is hypovolemic.  Hold on diuretic therapy for now.  Avoid hypotension and nephrotoxic medications.

## 2021-12-17 NOTE — Assessment & Plan Note (Addendum)
--   Sinus rhythm with brief run of of narrow complex tachycardia, appears regular, SVT versus atrial fibrillation.  -- Resume beta-blocker, continue apixaban

## 2021-12-17 NOTE — Assessment & Plan Note (Signed)
Continue anticoagulation with apixaban. Follow up with PT and OT evaluation.   Dyslipidemia, continue with statin therapy.

## 2021-12-18 ENCOUNTER — Inpatient Hospital Stay (HOSPITAL_COMMUNITY): Payer: Medicare HMO

## 2021-12-18 DIAGNOSIS — F0150 Vascular dementia without behavioral disturbance: Secondary | ICD-10-CM

## 2021-12-18 DIAGNOSIS — R1310 Dysphagia, unspecified: Secondary | ICD-10-CM

## 2021-12-18 DIAGNOSIS — N1831 Chronic kidney disease, stage 3a: Secondary | ICD-10-CM | POA: Diagnosis not present

## 2021-12-18 DIAGNOSIS — I5023 Acute on chronic systolic (congestive) heart failure: Secondary | ICD-10-CM | POA: Diagnosis not present

## 2021-12-18 DIAGNOSIS — I482 Chronic atrial fibrillation, unspecified: Secondary | ICD-10-CM | POA: Diagnosis not present

## 2021-12-18 LAB — BASIC METABOLIC PANEL
Anion gap: 10 (ref 5–15)
BUN: 19 mg/dL (ref 8–23)
CO2: 26 mmol/L (ref 22–32)
Calcium: 9 mg/dL (ref 8.9–10.3)
Chloride: 102 mmol/L (ref 98–111)
Creatinine, Ser: 1.41 mg/dL — ABNORMAL HIGH (ref 0.61–1.24)
GFR, Estimated: 55 mL/min — ABNORMAL LOW (ref >60)
Glucose, Bld: 81 mg/dL (ref 70–99)
Potassium: 3.8 mmol/L (ref 3.5–5.1)
Sodium: 138 mmol/L (ref 135–145)

## 2021-12-18 LAB — MAGNESIUM: Magnesium: 1.8 mg/dL (ref 1.7–2.4)

## 2021-12-18 MED ORDER — DAPAGLIFLOZIN PROPANEDIOL 10 MG PO TABS
10.0000 mg | ORAL_TABLET | Freq: Every day | ORAL | Status: DC
  Administered 2021-12-18 – 2021-12-19 (×2): 10 mg via ORAL
  Filled 2021-12-18 (×2): qty 1

## 2021-12-18 MED ORDER — MAGNESIUM SULFATE 2 GM/50ML IV SOLN
2.0000 g | Freq: Once | INTRAVENOUS | Status: AC
  Administered 2021-12-18: 2 g via INTRAVENOUS
  Filled 2021-12-18: qty 50

## 2021-12-18 MED ORDER — POTASSIUM CHLORIDE CRYS ER 20 MEQ PO TBCR
20.0000 meq | EXTENDED_RELEASE_TABLET | Freq: Once | ORAL | Status: AC
  Administered 2021-12-18: 20 meq via ORAL
  Filled 2021-12-18: qty 1

## 2021-12-18 MED ORDER — CARVEDILOL 3.125 MG PO TABS
3.1250 mg | ORAL_TABLET | Freq: Two times a day (BID) | ORAL | Status: DC
Start: 2021-12-18 — End: 2021-12-19
  Administered 2021-12-18 – 2021-12-19 (×2): 3.125 mg via ORAL
  Filled 2021-12-18 (×2): qty 1

## 2021-12-18 NOTE — Assessment & Plan Note (Signed)
--  present on admission --continue diet per ST

## 2021-12-18 NOTE — Evaluation (Signed)
Physical Therapy Evaluation & Discharge Patient Details Name: Donald [name] MRN: [id number] DOB: 06/24/1956 Today's Date: 12/18/2021  History of Present Illness  Pt is a 66 y.o. male admitted 12/16/21 with dyspnea. Workup for acute hypoxemic respiratory failure due to cardiogenic pulmonary edema, bilateral pleural effusion, acute on chronic CHF. PMH includes vascular dementia, CAD, CKD 3, stroke, afib; admission 10/2021 with PNA, bilateral pleural effusions s/p thoracentesis.   Clinical Impression  Patient evaluated by Physical Therapy with no further acute PT needs identified. PTA, pt mod indep ambulating with SPC, enjoys walking daily; reports brother recently moved pt into senior living/ILF apartment with roommate, brother provides assistance with transportation and iADLs as needed. Today, pt moving well with supervision for lines; no DOE noted, pt denies SOB. All education has been completed and the patient has no further questions. Acute PT is signing off. Thank you for this referral.    Post ambulation BP 105/69, HR 80  SATURATION QUALIFICATIONS: Patient Saturations on Room Air at Rest = 99% Patient Saturations on Room Air while Ambulating = 97% Patient Saturations on -- Liters of oxygen while Ambulating = N/A    Recommendations for follow up therapy are one component of a multi-disciplinary discharge planning process, led by the attending physician.  Recommendations may be updated based on patient status, additional functional criteria and insurance authorization.  Follow Up Recommendations No PT follow up      Assistance Recommended at Discharge PRN  Patient can return home with the following  Assistance with cooking/housework;Assist for transportation    Equipment Recommendations None recommended by PT  Recommendations for Other Services       Functional Status Assessment Patient has not had a recent decline in their functional status     Precautions / Restrictions  Precautions Precautions: Fall Restrictions Weight Bearing Restrictions: No      Mobility  Bed Mobility Overal bed mobility: Modified Independent             General bed mobility comments: HOB elevated    Transfers Overall transfer level: Modified independent Equipment used: Straight cane                    Ambulation/Gait Ambulation/Gait assistance: Supervision Gait Distance (Feet): 220 Feet Assistive device: Straight cane Gait Pattern/deviations: Step-through pattern, Decreased stride length, Trunk flexed Gait velocity: Decreased     General Gait Details: slow, steady gait with supervision for safety/lines; pt holding SPC but not using consistently, reports having for comfort and preference to walk holding it instead of trying without; denies SOB with mobility  Stairs            Wheelchair Mobility    Modified Rankin (Stroke Patients Only)       Balance Overall balance assessment: Needs assistance   Sitting balance-Leahy Scale: Good     Standing balance support: No upper extremity supported, During functional activity Standing balance-Leahy Scale: Good Standing balance comment: ambulatory without UE support                             Pertinent Vitals/Pain Pain Assessment Pain Assessment: No/denies pain    Home Living Family/patient expects to be discharged to:: Assisted living                 Home Equipment: BSC/3in1;Rolling Walker (2 wheels);Cane - single point Additional Comments: Reports recently moved into indep living apartment with roommate; was living with nephew last admission, "But it  wasn't working out... my brother and sister-in-law moved me into my new place"    Prior Function Prior Level of Function : Independent/Modified Independent             Mobility Comments: Mod indep with SPC for ambulation, enjoys walking daily. Does not drive ADLs Comments: Indep with ADLs; reports brother assists with  providing groceries/meals     Hand Dominance        Extremity/Trunk Assessment   Upper Extremity Assessment Upper Extremity Assessment: Overall WFL for tasks assessed    Lower Extremity Assessment Lower Extremity Assessment: Overall WFL for tasks assessed       Communication   Communication: No difficulties  Cognition Arousal/Alertness: Awake/alert Behavior During Therapy: Flat affect Overall Cognitive Status: No family/caregiver present to determine baseline cognitive functioning                                 General Comments: per chart, h/o vascular dementia. pt with flat affect, pleasant and cooperative, following simple commands appropriately; some decreased memory noted        General Comments General comments (skin integrity, edema, etc.): SpO2 >/97% on RA, HR 80 (afib?), 105/69 post-ambulation    Exercises     Assessment/Plan    PT Assessment Patient does not need any further PT services  PT Problem List         PT Treatment Interventions      PT Goals (Current goals can be found in the Care Plan section)  Acute Rehab PT Goals PT Goal Formulation: All assessment and education complete, DC therapy    Frequency       Co-evaluation               AM-PAC PT "6 Clicks" Mobility  Outcome Measure Help needed turning from your back to your side while in a flat bed without using bedrails?: None Help needed moving from lying on your back to sitting on the side of a flat bed without using bedrails?: None Help needed moving to and from a bed to a chair (including a wheelchair)?: A Little Help needed standing up from a chair using your arms (e.g., wheelchair or bedside chair)?: A Little Help needed to walk in hospital room?: A Little Help needed climbing 3-5 steps with a railing? : A Little 6 Click Score: 20    End of Session Equipment Utilized During Treatment: Gait belt Activity Tolerance: Patient tolerated treatment well Patient  left: in chair;with call bell/phone within reach;with chair alarm set Nurse Communication: Mobility status PT Visit Diagnosis: Other abnormalities of gait and mobility (R26.89)    Time: 1610-9604 PT Time Calculation (min) (ACUTE ONLY): 16 min   Charges:   PT Evaluation $PT Eval Low Complexity: Donald [name], PT, DPT Acute Rehabilitation Services  Pager [phone] Office [phone]  Donald [name] 12/18/2021, 9:14 AM

## 2021-12-18 NOTE — Evaluation (Signed)
Occupational Therapy Evaluation Patient Details Name: Donald [name] MRN: [id number] DOB: Mar 11, 1956 Today's Date: 12/18/2021   History of Present Illness Pt is a 66 y.o. male admitted 12/16/21 with dyspnea. Workup for acute hypoxemic respiratory failure due to cardiogenic pulmonary edema, bilateral pleural effusion, acute on chronic CHF. PMH includes vascular dementia, CAD, CKD 3, stroke, afib; admission 10/2021 with PNA, bilateral pleural effusions s/p thoracentesis.   Clinical Impression   66 yo male admitted with above detailed medical problems. He is pleasant and cooperative. Per chart review with baseline dementia. He is unsure where he lives but does know he lives with someone and reports assist available for medication management and ADLs as needed. Pt demonstrating indep with mobility this date and demonstrates ability to perform BADLs. Pt return to bed at end of session. He reports he would like to return to where he was living when he gets out of the hospital. Currently no acute OT needs as pt appears at his baseline. OT to sign off.      Recommendations for follow up therapy are one component of a multi-disciplinary discharge planning process, led by the attending physician.  Recommendations may be updated based on patient status, additional functional criteria and insurance authorization.   Follow Up Recommendations  No OT follow up    Assistance Recommended at Discharge Intermittent Supervision/Assistance  Patient can return home with the following Assist for transportation;Direct supervision/assist for medications management;Direct supervision/assist for financial management       Equipment Recommendations  None recommended by OT       Precautions / Restrictions Precautions Precautions: Fall      Mobility Bed Mobility Overal bed mobility: Modified Independent                  Transfers Overall transfer level: Modified independent                             ADL either performed or assessed with clinical judgement   ADL Overall ADL's : At baseline                 General ADL Comments: reports he does receive supervision for activities like showering, otherwise performs ADLs indep     Vision Baseline Vision/History: 0 No visual deficits              Pertinent Vitals/Pain Pain Assessment Pain Assessment: 0-10 Pain Score: 5  Pain Location: headache Pain Intervention(s): Limited activity within patient's tolerance, Other (comment) (pt refusing medication)     Hand Dominance Left   Extremity/Trunk Assessment Upper Extremity Assessment Upper Extremity Assessment: Overall WFL for tasks assessed   Lower Extremity Assessment Lower Extremity Assessment: Defer to PT evaluation       Communication Communication Communication: No difficulties   Cognition Arousal/Alertness: Awake/alert Behavior During Therapy: Flat affect Overall Cognitive Status: No family/caregiver present to determine baseline cognitive functioning                          Home Living Family/patient expects to be discharged to:: Assisted living           Home Equipment: BSC/3in1;Rolling Walker (2 wheels);Cane - single point          Prior Functioning/Environment Prior Level of Function : Independent/Modified Independent             Mobility Comments: Mod indep with SPC for ambulation, enjoys walking daily. Does not  drive ADLs Comments: Indep with ADLs; reports brother assists with providing groceries/meals        OT Problem List:  Cognitive deficits (baseline)      OT Treatment/Interventions:   Eval only    OT Goals(Current goals can be found in the care plan section) Acute Rehab OT Goals Patient Stated Goal: Return to wear he was living  OT Frequency:         AM-PAC OT "6 Clicks" Daily Activity     Outcome Measure Help from another person eating meals?: None Help from another person taking care of personal  grooming?: None Help from another person toileting, which includes using toliet, bedpan, or urinal?: None Help from another person bathing (including washing, rinsing, drying)?: None Help from another person to put on and taking off regular upper body clothing?: None Help from another person to put on and taking off regular lower body clothing?: None 6 Click Score: 24   End of Session Nurse Communication: Mobility status  Activity Tolerance: Patient tolerated treatment well Patient left: in bed                   Time: 1430-1451 OT Time Calculation (min): 21 min Charges:  OT General Charges $OT Visit: 1 Visit OT Evaluation $OT Eval Low Complexity: 1 Low   Kasandra Knudsen, OTR/L 12/18/2021, 3:15 PM

## 2021-12-18 NOTE — Progress Notes (Signed)
Mobility Specialist Progress Note    12/18/21 1647  Mobility  Activity Transferred from bed to chair  Level of Assistance Contact guard assist, steadying assist  Assistive Device Other (Comment) (HHA)  Distance Ambulated (ft) 3 ft  Activity Response Tolerated well  $Mobility charge 1 Mobility   Pt received and agreeable. No complaints. Left with call bell in reach.   Hildred Alamin Mobility Specialist

## 2021-12-18 NOTE — Social Work (Signed)
Pt is LTC at [facility], pt has not PT follow up and will DC back to [facility] when medically stable. CSW will continue to follow for DC planning needs.

## 2021-12-18 NOTE — Progress Notes (Addendum)
Heart Failure Stewardship Pharmacist Progress Note   PCP: Pcp, No PCP-Cardiologist: Sanda Klein, MD   HPI:  pleasant 66 y.o. male with medical history significant for dementia, chronic systolic CHF, atrial fibrillation on Eliquis, CAD, history of CVA, and CKD 3A who presents to the emergency department with worsening shortness of breath. Patient has been experiencing progressively worsening exertional dyspnea over the past several days, was unable to go more than a few steps with his walker on 12/13/2021 due to shortness of breath, and was dyspneic even at rest today.  Patient has supplemental oxygen to use as needed but has been requiring it around-the-clock the past few days.  Patient denies any chest pain or leg swelling.  He reports occasional cough that is mostly nonproductive.  Patient's brother raises concern that his liquids are not being thickened consistently at the nursing facility.  EF 25-30%, RV mildly reduced and mild MR on 10/2021 echo.   Upon arrival to the ED, patient was found to be afebrile, saturating mid 90s on 2 L/min of supplemental oxygen, tachypneic, and with systolic blood pressure of 93 and greater.  Chemistry panel notable for creatinine 1.29.  BNP is 408.  Troponin was normal x2.  Chest x-ray concerning for cardiomegaly with increased bilateral pleural effusions and edema consistent with heart failure.  Patient was diuresised with IV furosemide and is now euvolemic on exam.   Current HF Medications: Diuretic: none Other: imdur 30 daily  Prior to admission HF Medications: Diuretic: furosemide 40 mg daily Beta blocker: carvedilol listed but not on MAR for nursing home so assumed not given Other: Kcl 20 mEq daily, imdur 30 mg daily   Pertinent Lab Values: Serum creatinine 1.41, BUN 19, Potassium 3.8, Sodium 138, BNP 408, Magnesium 1.8, A1c pending   Vital Signs: Weight: 113 lbs (admission weight: 113 lbs) Blood pressure: 100-120/65-75  Heart rate: 85-95 I/O:  -687mL yesterday; net -660 mL  Medication Assistance / Insurance Benefits Check: Does the patient have prescription insurance?  Yes Type of insurance plan: [employer] Medicare Part D  Outpatient Pharmacy:  Prior to admission outpatient pharmacy: [place] Is the patient willing to use [place] pharmacy at discharge? No, patient is from SNF and will return to SNF so medications will be facility administered Is the patient willing to transition their outpatient pharmacy to utilize a [facility] outpatient pharmacy?   No- nursing home resident   Assessment: 1. Acute on chronic systolic CHF (LVEF 40-81%), due to unclear etiology. NYHA class IV symptoms. -Patient euvolemic on exam on 6/20. Agree with holding IV diuretics. Weight stable from 6/20 to 6/21.   -HR has room for initiation of beta-blocker and patient now euvolemic on exam.  -Continue PTA imdur 30 mg daily  -eGFR 52, check A1c, could benefit from addition of SGLT2i and spironolactone. -Keep K >4, Mg > 2   Plan: 1) Medication changes recommended at this time: -Start Farxiga 10 mg daily  -Start carvedilol 3.125 mg BID -Give 20 mEq Kcl, 2 g MgSO4  2) Patient assistance: -Copay Farxiga $95 -Copay Jardiance $45 -BiDil non-formulary, use individual components -Patient is from SNF and gets medications administered there, so does not need any patient assistance  3)  Education  - Patient/patient's brother has been educated on current HF medications and potential additions to HF medication regimen - Patient/patient's brother verbalizes understanding that over the next few months, these medication doses may change and more medications may be added to optimize HF regimen - Patient/  patient's brother has been educated on basic disease state pathophysiology and goals of therapy  Cathrine Muster, PharmD, [place] PGY2 Cardiology Pharmacy Resident

## 2021-12-18 NOTE — Plan of Care (Signed)
  Problem: Cardiac: Goal: Ability to achieve and maintain adequate cardiopulmonary perfusion will improve Outcome: Progressing   Problem: Education: Goal: Knowledge of General Education information will improve Description: Including pain rating scale, medication(s)/side effects and non-pharmacologic comfort measures Outcome: Progressing   Problem: Clinical Measurements: Goal: Ability to maintain clinical measurements within normal limits will improve Outcome: Progressing Goal: Will remain free from infection Outcome: Progressing Goal: Respiratory complications will improve Outcome: Progressing

## 2021-12-18 NOTE — Progress Notes (Signed)
Progress Note   Patient: Donald [name]:[id number] DOB: 04/30/1956 DOA: 12/16/2021     2 DOS: the patient was seen and examined on 12/18/2021   Brief hospital course: 66 year old man from [facility] chronic systolic CHF, dementia, atrial fibrillation on apixaban, presented with shortness of breath.  On oxygen as needed.  Admitted for acute on chronic systolic CHF with bilateral pleural effusions. --6/20 diuretics held. --6/21 repeat chest x-ray shows stability in regard to small pleural effusions.  Clinical exam is benign.  Episode of narrow complex tachycardia, will check electrolytes and monitor.  Likely return to [facility].  Assessment and Plan: * Acute on chronic systolic CHF (congestive heart failure) ([place]) --10/2021 echocardiogram with reduced LV systolic function with EF 25 to 30%, mild reduction in RV systolic pressure.  -- Appears euvolemic.  Continue to hold diuretic.  Agree with starting carvedilol and [place].  Replace potassium to goal for greater and magnesium to goal 2 or greater. -- I find no documentation to support acute hypoxic respiratory failure, patient not on oxygen, no desaturation with PT.  Acute hypoxic respiratory failure ruled out.  Chronic a-fib (HCC) -- Sinus rhythm with brief run of of narrow complex tachycardia, appears regular, SVT versus atrial fibrillation.  -- Resume beta-blocker, continue apixaban  Problems with swallowing and mastication --present on admission --continue diet per ST  History of stroke --Continue anticoagulation with apixaban.  Continue statin. --No PT follow-up needed.  Coronary artery disease --s/p CABG. stable, continue statin therapy and isosorbide.   Chronic kidney disease, stage 3a ([place]) -- Stable.    Vascular dementia with behavior disturbance ([place]) --Likely at baseline.  Calm. Continue with donepezil and mirtazapine.          Subjective:  Feels ok Doesn't know why he is  here confused  Physical Exam: Vitals:   12/18/21 0632 12/18/21 0737 12/18/21 0821 12/18/21 1125  BP:  101/66  104/67  Pulse:  77  83  Resp:  (!) 21  20  Temp:  97.6 F (36.4 C)  97.8 F (36.6 C)  TempSrc:  Oral  Oral  SpO2:  95% 96% 97%  Weight: 51.5 kg     Height:       Physical Exam Vitals and nursing note reviewed.  Constitutional:      General: He is not in acute distress.    Appearance: He is not ill-appearing or toxic-appearing.  Cardiovascular:     Rate and Rhythm: Normal rate and regular rhythm.     Heart sounds: No murmur heard.    Comments: Telemetry SR w/ NSVT 6/20 and brief run of narrow complex tachycardia today, likely SVT Pulmonary:     Effort: Pulmonary effort is normal. No respiratory distress.     Breath sounds: No wheezing, rhonchi or rales.  Musculoskeletal:     Right lower leg: No edema.     Left lower leg: No edema.     Comments: Lifts both legs off bed together without difficulty  Neurological:     Mental Status: He is alert. He is disoriented.     Comments: "Hospital" but unable to name month, year, president  Psychiatric:        Mood and Affect: Mood normal.        Behavior: Behavior normal.     Data Reviewed:  I/O -60 UOP 450  Family Communication: brother Donald [name] by phone;  Disposition: Status is: Inpatient Remains inpatient appropriate because: brief SVT  Planned Discharge Destination: return to [facility]    Time spent: 35 minutes  Author: Murray Hodgkins, MD 12/18/2021 11:28 AM  For on call review [email or web address].

## 2021-12-19 DIAGNOSIS — R1310 Dysphagia, unspecified: Secondary | ICD-10-CM | POA: Diagnosis not present

## 2021-12-19 DIAGNOSIS — N1831 Chronic kidney disease, stage 3a: Secondary | ICD-10-CM | POA: Diagnosis not present

## 2021-12-19 DIAGNOSIS — I5023 Acute on chronic systolic (congestive) heart failure: Secondary | ICD-10-CM | POA: Diagnosis not present

## 2021-12-19 DIAGNOSIS — I482 Chronic atrial fibrillation, unspecified: Secondary | ICD-10-CM | POA: Diagnosis not present

## 2021-12-19 LAB — BASIC METABOLIC PANEL
Anion gap: 13 (ref 5–15)
BUN: 23 mg/dL (ref 8–23)
CO2: 26 mmol/L (ref 22–32)
Calcium: 8.8 mg/dL — ABNORMAL LOW (ref 8.9–10.3)
Chloride: 99 mmol/L (ref 98–111)
Creatinine, Ser: 1.65 mg/dL — ABNORMAL HIGH (ref 0.61–1.24)
GFR, Estimated: 46 mL/min — ABNORMAL LOW (ref >60)
Glucose, Bld: 88 mg/dL (ref 70–99)
Potassium: 4 mmol/L (ref 3.5–5.1)
Sodium: 138 mmol/L (ref 135–145)

## 2021-12-19 LAB — MAGNESIUM: Magnesium: 2.2 mg/dL (ref 1.7–2.4)

## 2021-12-19 LAB — HEMOGLOBIN A1C
Hgb A1c MFr Bld: 5.6 % (ref 4.8–5.6)
Mean Plasma Glucose: 114.02 mg/dL

## 2021-12-19 MED ORDER — CARVEDILOL 3.125 MG PO TABS: 3.1250 mg | ORAL_TABLET | Freq: Two times a day (BID) | ORAL | Status: DC

## 2021-12-19 MED ORDER — DAPAGLIFLOZIN PROPANEDIOL 10 MG PO TABS: 10.0000 mg | ORAL_TABLET | Freq: Every day | ORAL | Status: DC

## 2021-12-19 NOTE — Care Management Important Message (Signed)
Important Message  Patient Details  Name: Donald [name] MRN: [id number] Date of Birth: July 05, 1955   Medicare Important Message Given:  Yes     Dorena Bodo 12/19/2021, 2:26 PM

## 2021-12-19 NOTE — Progress Notes (Signed)
RN removed PIVs. Belongings w/ pt. RN gave report to Sonia Side, LPN, at receiving facility, [address]. Pt's brother to transport pt to facility. NT transporting pt to private vehicle.

## 2021-12-19 NOTE — Progress Notes (Signed)
Speech Language Pathology Treatment: Dysphagia  Patient Details Name: Donald [name] MRN: [id number] DOB: [birth year] Today's Date: 12/19/2021 Time: 9024-0973 SLP Time Calculation (min) (ACUTE ONLY): 8 min  Assessment / Plan / Recommendation Clinical Impression  Donald [name] was alert and pleasant this morning. Observation with nectar thick was unremarkable, timely and efficient. Had banana at bedside and masticated, transited adequately without residue. Pt educated that he needs to continue nectar thickened liquids in rehab and he voiced understanding. Continue regular, nectar thick liquids, straws or cups, pills whole in puree. Discharge ST in acute care.    HPI HPI: Donald [name] is a pleasant 66 y.o. male with medical history significant for dementia, dysphagia, chronic systolic CHF, atrial fibrillation on Eliquis, CAD, history of CVA, and CKD 3A who presents to the emergency department with worsening shortness of breath.  Per chart patient's brother raises concern that his liquids are not being thickened consistently at the nursing facility. Chest x-ray concerning for cardiomegaly with increased bilateral pleural effusions and edema consistent with heart failure. MBS 11/04/2021 revealed moderate oropharyngeal dysphagia with anatomical impact marked by multiple and significant cervical fusions and cervical rigidity; Dys 2/nectar recommended.      SLP Plan  All goals met;Discharge SLP treatment due to (comment)      Recommendations for follow up therapy are one component of a multi-disciplinary discharge planning process, led by the attending physician.  Recommendations may be updated based on patient status, additional functional criteria and insurance authorization.    Recommendations  Diet recommendations: Regular;Nectar-thick liquid Liquids provided via: Cup;Straw Medication Administration: Whole meds with puree Supervision: Patient able to self feed;Intermittent supervision to cue for  compensatory strategies Compensations: Minimize environmental distractions;Slow rate;Small sips/bites Postural Changes and/or Swallow Maneuvers: Seated upright 90 degrees                Oral Care Recommendations: Oral care BID Follow Up Recommendations: Skilled nursing-short term rehab (<3 hours/day) Assistance recommended at discharge: Intermittent Supervision/Assistance SLP Visit Diagnosis: Dysphagia, unspecified (R13.10) Plan: All goals met;Discharge SLP treatment due to (comment)           Houston Siren  12/19/2021, 8:46 AM

## 2021-12-19 NOTE — Progress Notes (Signed)
Heart Failure Stewardship Pharmacist Progress Note   PCP: Pcp, No PCP-Cardiologist: Sanda Klein, MD   HPI:  pleasant 66 y.o. male with medical history significant for dementia, chronic systolic CHF, atrial fibrillation on Eliquis, CAD, history of CVA, and CKD 3A who presents to the emergency department with worsening shortness of breath. Patient has been experiencing progressively worsening exertional dyspnea over the past several days, was unable to go more than a few steps with his walker on 12/13/2021 due to shortness of breath, and was dyspneic even at rest today.  Patient has supplemental oxygen to use as needed but has been requiring it around-the-clock the past few days.  Patient denies any chest pain or leg swelling.  He reports occasional cough that is mostly nonproductive.  Patient's brother raises concern that his liquids are not being thickened consistently at the nursing facility.  EF 25-30%, RV mildly reduced and mild MR on 10/2021 echo.   Upon arrival to the ED, patient was found to be afebrile, saturating mid 90s on 2 L/min of supplemental oxygen, tachypneic, and with systolic blood pressure of 93 and greater.  Chemistry panel notable for creatinine 1.29.  BNP is 408.  Troponin was normal x2.  Chest x-ray concerning for cardiomegaly with increased bilateral pleural effusions and edema consistent with heart failure.  Patient was diuresised with IV furosemide and is now euvolemic on exam.   Current HF Medications: Diuretic: none SGLT2i: Farxiga 10 mg daily Other: imdur 30 daily  Prior to admission HF Medications: Diuretic: furosemide 40 mg daily Beta blocker: carvedilol listed but not on MAR for nursing home so assumed not given Other: Kcl 20 mEq daily, imdur 30 mg daily   Pertinent Lab Values: Serum creatinine 1.65, BUN 23, Potassium 4.0, Sodium 138, BNP 408, Magnesium 2.2, A1c 5.6%   Vital Signs: Weight: 115 lbs (admission weight: 113 lbs) Blood pressure: 86-102/45-55   Heart rate: 60-75 I/O: -400 mL yesterday; net -467 mL  Medication Assistance / Insurance Benefits Check: Does the patient have prescription insurance?  Yes Type of insurance plan: [employer] Medicare Part D  Outpatient Pharmacy:  Prior to admission outpatient pharmacy: [facility] Is the patient willing to use [place] pharmacy at discharge? No, patient is from SNF and will return to SNF so medications will be facility administered Is the patient willing to transition their outpatient pharmacy to utilize a [facility] outpatient pharmacy?   No- nursing home resident   Assessment: 1. Acute on chronic systolic CHF (LVEF 50-35%), due to unclear etiology. NYHA class III symptoms. -Patient euvolemic on exam. Weight increased 2 lbs from yesterday to today. Weight stable from 6/20 to 6/21. Patient will likely need PO diuretic. Start PTA regimen of furosemide 40 mg daily.  -HR stable, BP on soft side, continue current carvedilol 3.125 mg BID -Scr trending up, patient now with AKI, however continue [place] pending discharge  -Continue PTA imdur 30 mg daily  -could benefit from addition of spironolactone and resumption of Farxiga once AKI is resolved -Keep K >4, Mg > 2 -Patient will need daily weights to assess diuretics   Plan: 1) Medication changes recommended at this time: -Resume PO furosemide 40 mg daily on discharge  -[facility], order placed for daily weights  2) Patient assistance: -Copay Farxiga $95 -Copay Jardiance $45 -BiDil non-formulary, use individual components -Patient is from SNF and gets medications administered there, so does not need any patient assistance  3)  Education  - Patient/patient's brother has been  educated on current HF medications and potential additions to HF medication regimen - Patient/patient's brother verbalizes understanding that over the next few months, these medication doses may change and more medications may be added to  optimize HF regimen - Patient/ patient's brother has been educated on basic disease state pathophysiology and goals of therapy  Cathrine Muster, PharmD, [place] PGY2 Cardiology Pharmacy Resident

## 2021-12-19 NOTE — TOC Transition Note (Signed)
Transition of Care [facility]) - CM/SW Discharge Note   Patient Details  Name: Donald [name] MRN: [id number] Date of Birth: 1956/06/11  Transition of Care [facility]) CM/SW Contact:  Tresa Endo Phone Number: 12/19/2021, 12:34 PM   Clinical Narrative:    Patient will [facility] to: [facility] Anticipated DC date: 12/19/2021 Family notified: Pt Brother  Transport by: Pt Brother    Per MD patient ready for DC to [facility]. RN to call report prior to discharge [phone]). RN, patient, patient's family, and facility notified of DC. Discharge Summary and FL2 sent to facility. DC packet on chart.   CSW will sign off for now as social work intervention is no longer needed. Please consult [place] again if new needs arise.           Patient Goals and CMS Choice        Discharge Placement                       Discharge Plan and Services                                     Social Determinants of Health (SDOH) Interventions Food Insecurity Interventions: Intervention Not Indicated Housing Interventions: Intervention Not Indicated Transportation Interventions: Intervention Not Indicated   Readmission Risk Interventions     No data to display

## 2021-12-19 NOTE — Progress Notes (Signed)
Phoned [facility] to give report, the RN was not available.  Left message to return call for report.

## 2021-12-25 ENCOUNTER — Encounter (HOSPITAL_COMMUNITY): Payer: Self-pay

## 2021-12-25 ENCOUNTER — Emergency Department (HOSPITAL_COMMUNITY)
Admission: EM | Admit: 2021-12-25 | Discharge: 2021-12-25 | Disposition: A | Discharge status: Skilled Nursing Facility | Payer: Medicare HMO | Attending: Emergency Medicine | Admitting: Emergency Medicine

## 2021-12-25 ENCOUNTER — Other Ambulatory Visit: Payer: Self-pay

## 2021-12-25 ENCOUNTER — Emergency Department (HOSPITAL_COMMUNITY): Payer: Medicare HMO

## 2021-12-25 DIAGNOSIS — F0390 Unspecified dementia without behavioral disturbance: Secondary | ICD-10-CM | POA: Insufficient documentation

## 2021-12-25 DIAGNOSIS — I951 Orthostatic hypotension: Secondary | ICD-10-CM | POA: Diagnosis not present

## 2021-12-25 DIAGNOSIS — Z7901 Long term (current) use of anticoagulants: Secondary | ICD-10-CM | POA: Insufficient documentation

## 2021-12-25 DIAGNOSIS — I5022 Chronic systolic (congestive) heart failure: Secondary | ICD-10-CM | POA: Insufficient documentation

## 2021-12-25 DIAGNOSIS — R55 Syncope and collapse: Secondary | ICD-10-CM

## 2021-12-25 DIAGNOSIS — R42 Dizziness and giddiness: Secondary | ICD-10-CM | POA: Diagnosis present

## 2021-12-25 LAB — CBC WITH DIFFERENTIAL/PLATELET
Abs Immature Granulocytes: 0.06 10*3/uL (ref 0.00–0.07)
Basophils Absolute: 0 10*3/uL (ref 0.0–0.1)
Basophils Relative: 0 %
Eosinophils Absolute: 0.2 10*3/uL (ref 0.0–0.5)
Eosinophils Relative: 2 %
HCT: 35.6 % — ABNORMAL LOW (ref 39.0–52.0)
Hemoglobin: 11.4 g/dL — ABNORMAL LOW (ref 13.0–17.0)
Immature Granulocytes: 1 %
Lymphocytes Relative: 18 %
Lymphs Abs: 2.2 10*3/uL (ref 0.7–4.0)
MCH: 26.6 pg (ref 26.0–34.0)
MCHC: 32 g/dL (ref 30.0–36.0)
MCV: 83.2 fL (ref 80.0–100.0)
Monocytes Absolute: 1.2 10*3/uL — ABNORMAL HIGH (ref 0.1–1.0)
Monocytes Relative: 10 %
Neutro Abs: 8.8 10*3/uL — ABNORMAL HIGH (ref 1.7–7.7)
Neutrophils Relative %: 69 %
Platelets: 270 10*3/uL (ref 150–400)
RBC: 4.28 MIL/uL (ref 4.22–5.81)
RDW: 15 % (ref 11.5–15.5)
WBC: 12.5 10*3/uL — ABNORMAL HIGH (ref 4.0–10.5)
nRBC: 0 % (ref 0.0–0.2)

## 2021-12-25 LAB — COMPREHENSIVE METABOLIC PANEL
ALT: 18 U/L (ref 0–44)
AST: 17 U/L (ref 15–41)
Albumin: 3 g/dL — ABNORMAL LOW (ref 3.5–5.0)
Alkaline Phosphatase: 17 U/L — ABNORMAL LOW (ref 38–126)
Anion gap: 8 (ref 5–15)
BUN: 27 mg/dL — ABNORMAL HIGH (ref 8–23)
CO2: 28 mmol/L (ref 22–32)
Calcium: 8.7 mg/dL — ABNORMAL LOW (ref 8.9–10.3)
Chloride: 103 mmol/L (ref 98–111)
Creatinine, Ser: 1.48 mg/dL — ABNORMAL HIGH (ref 0.61–1.24)
GFR, Estimated: 52 mL/min — ABNORMAL LOW (ref >60)
Glucose, Bld: 98 mg/dL (ref 70–99)
Potassium: 3.8 mmol/L (ref 3.5–5.1)
Sodium: 139 mmol/L (ref 135–145)
Total Bilirubin: 1.2 mg/dL (ref 0.3–1.2)
Total Protein: 6.6 g/dL (ref 6.5–8.1)

## 2021-12-25 NOTE — ED Notes (Signed)
Patient's brother Marcello Moores is on his way to take his brother back to his facility . Patient made aware.

## 2021-12-25 NOTE — ED Provider Notes (Signed)
[facility] DEPT Provider Note   CSN: [id number] Arrival date & time: 12/25/21  1532     History  Chief Complaint  Patient presents with   Hypotension    [name] is a 66 y.o. male.  Pt is a 65y/o male with hx of chronic systolic CHF, dementia, atrial fibrillation on apixaban who was recently admitted to the hospital on 12/16/2021 for CHF requiring diuresis and modification of his medications.  Patient was discharged on 12/19/2021 and has been at rehab since that time.  Patient reports he has been participating in rehab, using a walker and taking the medications he is prescribed.  He has not had any chest pain, shortness of breath, leg swelling, abdominal pain, nausea or vomiting.  He reports he is eating but the food is not very good so he eats some but not his baseline.  Today he had participated in rehab earlier this morning had been doing well and reported he was going to get up from bed when he started feeling very lightheaded and dizzy.  The staff checked his blood pressure and it was 70/30 and they called 911.  Patient reports throughout this episode he had no palpitations chest pain or shortness of breath.  After receiving 300 mL of fluid he reports now he feels back to normal.  Blood pressure did improve to 104/60 3:01 100 mL.  Patient recently had an echo that showed an EF of 20 to 25%.  He recently had his carvedilol decreased and started [place].  That is the only medication change that he is aware of.  His diuretic was resumed when he was discharged from the hospital.        Home Medications Prior to Admission medications   Medication Sig Start Date End Date Taking? Authorizing Provider  albuterol (VENTOLIN HFA) 108 (90 Base) MCG/ACT inhaler Inhale 2 puffs into the lungs every 4 (four) hours as needed for wheezing or shortness of breath. 11/05/21   Hongalgi, Lenis Dickinson, MD  apixaban (ELIQUIS) 2.5 MG TABS tablet Take 1 tablet (2.5 mg total) by  mouth 2 (two) times daily. 11/05/21   Hongalgi, Lenis Dickinson, MD  atorvastatin (LIPITOR) 40 MG tablet TAKE 1 TABLET EVERY DAY Patient taking differently: Take 40 mg by mouth at bedtime. 09/27/18   Glenis Smoker, MD  carvedilol (COREG) 3.125 MG tablet Take 1 tablet (3.125 mg total) by mouth 2 (two) times daily with a meal. 12/19/21   Samuella Cota, MD  dapagliflozin propanediol (FARXIGA) 10 MG TABS tablet Take 1 tablet (10 mg total) by mouth daily. 12/20/21   Samuella Cota, MD  donepezil (ARICEPT) 10 MG tablet Take 10 mg by mouth at bedtime.    [provider]  food thickener (SIMPLYTHICK, NECTAR/LEVEL 2/MILDLY THICK,) GEL Take 1 packet by mouth as needed (Take 1 packet by mouth as needed). Patient not taking: Reported on 12/16/2021 11/05/21   Modena Jansky, MD  furosemide (LASIX) 40 MG tablet Take 1 tablet (40 mg total) by mouth daily. 11/05/21   Hongalgi, Lenis Dickinson, MD  gabapentin (NEURONTIN) 100 MG capsule Take 1 capsule (100 mg total) by mouth daily. 11/05/21   Hongalgi, Lenis Dickinson, MD  isosorbide mononitrate (IMDUR) 30 MG 24 hr tablet Take 1 tablet (30 mg total) by mouth daily. 04/16/21   Cheryln Manly, NP  loperamide (IMODIUM A-D) 2 MG tablet Take 2 mg by mouth 2 (two) times daily as needed for diarrhea or loose stools.  [provider]  mirtazapine (REMERON) 15 MG tablet Take 15 mg by mouth at bedtime.    [provider]  Multiple Vitamin (MULTIVITAMIN WITH MINERALS) TABS tablet Take 1 tablet by mouth daily. 11/06/21   Hongalgi, Lenis Dickinson, MD  Nutritional Supplements (FEEDING SUPPLEMENT, NEPRO CARB STEADY,) LIQD Take 237 mLs by mouth 2 (two) times daily between meals. 11/05/21   Hongalgi, Lenis Dickinson, MD  omeprazole (PRILOSEC) 20 MG capsule Take 20 mg by mouth daily before breakfast.    [provider]  potassium chloride SA (KLOR-CON M) 20 MEQ tablet Take 1 tablet (20 mEq total) by mouth daily. 11/05/21   Hongalgi, Lenis Dickinson, MD  tamsulosin (FLOMAX) 0.4 MG CAPS  capsule TAKE 1 CAPSULE EVERY DAY Patient taking differently: Take 0.4 mg by mouth daily. 12/12/17   Glenis Smoker, MD  Tiotropium Bromide-Olodaterol (STIOLTO RESPIMAT) 2.5-2.5 MCG/ACT AERS Inhale 2 puffs into the lungs daily. 12/02/21   Freddi Starr, MD      Allergies    Misc. sulfonamide containing compounds, Penicillins, Sulfa antibiotics, Adhesive [tape], and Tramadol    Review of Systems   Review of Systems  Physical Exam Updated Vital Signs BP (!) 111/58   Pulse 79   Temp 97.6 F (36.4 C) (Oral)   Resp 18   Ht '5\' 4"'$  (1.626 m)   Wt 47.6 kg   SpO2 99%   BMI 18.02 kg/m  Physical Exam Vitals and nursing note reviewed.  Constitutional:      General: He is not in acute distress.    Appearance: He is well-developed. He is ill-appearing.  HENT:     Head: Normocephalic and atraumatic.  Eyes:     Conjunctiva/sclera: Conjunctivae normal.     Pupils: Pupils are equal, round, and reactive to light.  Cardiovascular:     Rate and Rhythm: Normal rate and regular rhythm.     Heart sounds: No murmur heard. Pulmonary:     Effort: Pulmonary effort is normal. No respiratory distress.     Breath sounds: Normal breath sounds. No wheezing or rales.  Abdominal:     General: There is no distension.     Palpations: Abdomen is soft.     Tenderness: There is no abdominal tenderness. There is no guarding or rebound.  Musculoskeletal:        General: No tenderness. Normal range of motion.     Cervical back: Normal range of motion and neck supple.     Right lower leg: No edema.     Left lower leg: No edema.  Skin:    General: Skin is warm and dry.     Findings: No erythema or rash.  Neurological:     Mental Status: He is alert and oriented to person, place, and time. Mental status is at baseline.  Psychiatric:        Behavior: Behavior normal.     ED Results / Procedures / Treatments   Labs (all labs ordered are listed, but only abnormal results are displayed) Labs  Reviewed  CBC WITH DIFFERENTIAL/PLATELET - Abnormal; Notable for the following components:      Result Value   WBC 12.5 (*)    Hemoglobin 11.4 (*)    HCT 35.6 (*)    Neutro Abs 8.8 (*)    Monocytes Absolute 1.2 (*)    All other components within normal limits  COMPREHENSIVE METABOLIC PANEL - Abnormal; Notable for the following components:   BUN 27 (*)    Creatinine, Ser 1.48 (*)  Calcium 8.7 (*)    Albumin 3.0 (*)    Alkaline Phosphatase 17 (*)    GFR, Estimated 52 (*)    All other components within normal limits    EKG EKG Interpretation  Date/Time:  Wednesday December 25 2021 15:53:33 EDT Ventricular Rate:  78 PR Interval:  162 QRS Duration: 119 QT Interval:  382 QTC Calculation: 436 R Axis:   87 Text Interpretation: Sinus rhythm Ventricular premature complex Anterior infarct, old Nonspecific T abnormalities, inferior leads Artifact in lead(s) I aVR V1 No significant change since last tracing Confirmed by Blanchie Dessert ([id number]) on 12/25/2021 4:09:03 PM  Radiology DG Chest Port 1 View  Result Date: 12/25/2021 CLINICAL DATA:  Near syncope with hypotension EXAM: PORTABLE CHEST 1 VIEW COMPARISON:  12/18/2021, 11/01/2021, CT 10/28/2021 FINDINGS: Post sternotomy changes and left-sided electronic device. At least moderate bilateral pleural effusions. Hazy pulmonary opacity could be due to edema though cardiac size within normal limits. Bibasilar consolidations without change. IMPRESSION: Persistent at least moderate bilateral pleural effusions with continued basilar airspace disease, atelectasis versus pneumonia Electronically Signed   By: Donavan Foil M.D.   On: 12/25/2021 17:36    Procedures Procedures    Medications Ordered in ED Medications - No data to display  ED Course/ Medical Decision Making/ A&P                           Medical Decision Making Amount and/or Complexity of Data Reviewed Independent Historian: EMS External Data Reviewed: notes.    Details: Recent  hospitalization Labs: ordered. Decision-making details documented in ED Course. Radiology: ordered and independent interpretation performed. Decision-making details documented in ED Course. ECG/medicine tests: ordered and independent interpretation performed. Decision-making details documented in ED Course.   Pt with multiple medical problems and comorbidities and presenting today with a complaint that caries a high risk for morbidity and mortality.  Presenting today with a near syncopal event.  It sounds like patient was going to stand up when he started having presyncopal symptoms.  He denies losing consciousness and reports improvement after laying down and receiving 300 mL of fluid.  Patient recently admitted to the hospital due to CHF, known low EF and had medication adjustments and diuresis.  Concern for possible overdiuresis, poor oral intake because he is not a big fan of the food at the rehab facility where he is staying.  Low suspicion for PE at this time as patient is anticoagulated has no unilateral leg pain or swelling and denies any shortness of breath or chest pain.  Lower suspicion for ACS just given patient's symptoms and no complaints of chest pain, shortness of breath.  Patient has no infectious symptoms has no abdominal pain at this time.  I independently interpreted patient's EKG today which showed no acute findings.  I independently interpreted patient's labs minimal leukocytosis of 12 with stable hemoglobin of 11, CMP with stable creatinine of 1.48 normal LFTs and electrolytes I have independently visualized and interpreted pt's images today and it appears to be unchanged with cardiomegaly and hazy opacities bilaterally.  Similar to chest x-ray done on prior visit..  Patient's blood pressure has remained stable after receiving 300 mL of fluid.  Given his low EF do not want to overload him.    7:34 PM Orthostatics were within normal limits.  Patient was able to stand with a walker and  had no complaints of dizziness.  He reports he had 1 other episode of this  last week.  Question whether it is medication related or poor oral intake.  At this time patient does not appear to require admission and appears stable for discharge back to the rehab facility.  Will encourage them to check his blood pressure daily and follow-up with cardiology if his blood pressure continues to run low.  Also encouraged increased oral intake.         Final Clinical Impression(s) / ED Diagnoses Final diagnoses:  Near syncope  Orthostatic hypotension    Rx / DC Orders ED Discharge Orders     None         Blanchie Dessert, MD 12/25/21 1934

## 2021-12-25 NOTE — Discharge Instructions (Addendum)
You need to have your blood pressure checked daily and recorded and if it continues to run low or you continue to have episodes of feeling like you are going to pass out when you stand up you most likely will need your medications adjusted.  If you have chest pain, shortness of breath, new swelling or weight gain you should call the cardiologist or return to the emergency room.  You need to make sure you are staying hydrated and eating.

## 2021-12-25 NOTE — ED Triage Notes (Addendum)
Pt BIB EMS from [facility] c/o hypotension. Pt recently dx with chf pt on lasix since 6/22. Pt c/o dizziness and sob  70/40 initial bp 94/52 after 340m bolus 80 HR 16RR 98% room air

## 2022-01-01 ENCOUNTER — Telehealth (HOSPITAL_COMMUNITY): Payer: Self-pay | Admitting: *Deleted

## 2022-01-01 ENCOUNTER — Encounter (HOSPITAL_COMMUNITY): Payer: Self-pay

## 2022-01-01 ENCOUNTER — Ambulatory Visit (HOSPITAL_COMMUNITY)
Admit: 2022-01-01 | Discharge: 2022-01-01 | Disposition: A | Discharge status: Home / Self Care | Payer: Medicare HMO | Attending: Adult Health | Admitting: Adult Health

## 2022-01-01 VITALS — BP 110/70 | HR 80 | Wt 115.4 lb

## 2022-01-01 DIAGNOSIS — Z9151 Personal history of suicidal behavior: Secondary | ICD-10-CM | POA: Diagnosis not present

## 2022-01-01 DIAGNOSIS — Z7901 Long term (current) use of anticoagulants: Secondary | ICD-10-CM | POA: Insufficient documentation

## 2022-01-01 DIAGNOSIS — F0390 Unspecified dementia without behavioral disturbance: Secondary | ICD-10-CM | POA: Insufficient documentation

## 2022-01-01 DIAGNOSIS — N1831 Chronic kidney disease, stage 3a: Secondary | ICD-10-CM

## 2022-01-01 DIAGNOSIS — I482 Chronic atrial fibrillation, unspecified: Secondary | ICD-10-CM | POA: Diagnosis not present

## 2022-01-01 DIAGNOSIS — I2510 Atherosclerotic heart disease of native coronary artery without angina pectoris: Secondary | ICD-10-CM

## 2022-01-01 DIAGNOSIS — R1310 Dysphagia, unspecified: Secondary | ICD-10-CM | POA: Diagnosis not present

## 2022-01-01 DIAGNOSIS — F0153 Vascular dementia, unspecified severity, with mood disturbance: Secondary | ICD-10-CM | POA: Insufficient documentation

## 2022-01-01 DIAGNOSIS — R42 Dizziness and giddiness: Secondary | ICD-10-CM | POA: Insufficient documentation

## 2022-01-01 DIAGNOSIS — I5022 Chronic systolic (congestive) heart failure: Secondary | ICD-10-CM

## 2022-01-01 DIAGNOSIS — E039 Hypothyroidism, unspecified: Secondary | ICD-10-CM | POA: Diagnosis not present

## 2022-01-01 DIAGNOSIS — Z8673 Personal history of transient ischemic attack (TIA), and cerebral infarction without residual deficits: Secondary | ICD-10-CM | POA: Insufficient documentation

## 2022-01-01 DIAGNOSIS — Z951 Presence of aortocoronary bypass graft: Secondary | ICD-10-CM | POA: Diagnosis not present

## 2022-01-01 DIAGNOSIS — I480 Paroxysmal atrial fibrillation: Secondary | ICD-10-CM | POA: Insufficient documentation

## 2022-01-01 DIAGNOSIS — E785 Hyperlipidemia, unspecified: Secondary | ICD-10-CM | POA: Insufficient documentation

## 2022-01-01 NOTE — Telephone Encounter (Signed)
Heart Failure Nurse Navigator Progress Note   Mailbox full, unable to  leave appointment reminder voicemail for 10 am on 01/01/22.Earnestine Leys, BSN, RN Heart Failure Transport planner Only

## 2022-01-01 NOTE — Patient Instructions (Signed)
No changes made  Form for [facility] completed and given to patient and his aide  Referral placed to gen cards, note to call Isaias Cowman with appt date/time

## 2022-01-01 NOTE — Progress Notes (Signed)
HEART & VASCULAR TRANSITION OF CARE CONSULT NOTE     Referring Physician: Dr Acie Fredrickson Primary Care: None Primary Cardiologist:Dr Nishan   HPI: Referred to clinic by Dr Acie Fredrickson  for heart failure consultation.   [name] is a 66 year old with a history of CVA, loop recorder, CAD, S/P CABG x3, HLD, CKD Stage II, brain aneurysm, PAF,  hypothyroid, dysphagia, vascular dementia, ICM, and chronic HFrEF.    Admitted 10/28/2021 with sepsis due to CAP with suspected aspiration. Lactate 2.9. Had bilateral thoracentesis. Diuresed with IV lasix. Echo completed and EF was down from 40-45% to 25-30%. Maintained SR.  Outpatient stress test was set up. Creatinine at discharge was 1.6. Discharged to [name].   Admitted 12/16/21 with A/C HFrEF. Diuresed with IV lasix and transitioned to lasix 40 mg po daily. Started on farxiga and carvedilol was cut back to coreg 3.125 mg twice a day. Discharged 12/19/21. Discharge weight 115 pounds.   Returned to ED  12/25/21 due to hypotension. Given IV fluid bolus. He was discharged back to [facility] the same day.   He presents today with an Aide from SNF. Overall feeling fine. Denies SOB/PND/Orthopnea. Able to walk in his room at [name]. He denies chest pain. Appetite ok.  He is on nectar thick  diet. He has been working with therapy. No fever or chills. Weight at SNF 115 pounds. Taking all medications. He wants to stay at [name]. Medications provided at [name].   Cardiac Testing  Echo 10/2021  EF 25-30%  RV mildly reduced.  Echo 04/15/2021 EF 40-45% RV normal Grade IDD   Review of Systems: [y] = yes, '[ ]'$  = no   General: Weight gain '[ ]'$ ; Weight loss '[ ]'$ ; Anorexia '[ ]'$ ; Fatigue '[ ]'$ ; Fever '[ ]'$ ; Chills '[ ]'$ ; Weakness '[ ]'$   Cardiac: Chest pain/pressure '[ ]'$ ; Resting SOB '[ ]'$ ; Exertional SOB '[ ]'$ ; Orthopnea '[ ]'$ ; Pedal Edema '[ ]'$ ; Palpitations '[ ]'$ ; Syncope '[ ]'$ ; Presyncope '[ ]'$ ; Paroxysmal nocturnal dyspnea'[ ]'$   Pulmonary: Cough '[ ]'$ ; Wheezing'[ ]'$ ; Hemoptysis'[ ]'$ ; Sputum '[ ]'$ ;  Snoring '[ ]'$   GI: Vomiting'[ ]'$ ; Dysphagia'[ ]'$ ; Melena'[ ]'$ ; Hematochezia '[ ]'$ ; Heartburn'[ ]'$ ; Abdominal pain '[ ]'$ ; Constipation '[ ]'$ ; Diarrhea '[ ]'$ ; BRBPR '[ ]'$   GU: Hematuria'[ ]'$ ; Dysuria '[ ]'$ ; Nocturia'[ ]'$   Vascular: Pain in legs with walking '[ ]'$ ; Pain in feet with lying flat '[ ]'$ ; Non-healing sores '[ ]'$ ; Stroke [ Y]; TIA '[ ]'$ ; Slurred speech '[ ]'$ ;  Neuro: Headaches'[ ]'$ ; Vertigo'[ ]'$ ; Seizures'[ ]'$ ; Paresthesias'[ ]'$ ;Blurred vision '[ ]'$ ; Diplopia '[ ]'$ ; Vision changes '[ ]'$   Ortho/Skin: Arthritis '[ ]'$ ; Joint pain '[ ]'$ ; Muscle pain '[ ]'$ ; Joint swelling '[ ]'$ ; Back Pain [Y ]; Rash '[ ]'$   Psych: Depression'[ ]'$ ; Anxiety'[ ]'$   Heme: Bleeding problems '[ ]'$ ; Clotting disorders '[ ]'$ ; Anemia '[ ]'$   Endocrine: Diabetes '[ ]'$ ; Thyroid dysfunction[ Y]   Past Medical History:  Diagnosis Date   Cataract    Cerebral aneurysm without rupture    Followed by Dr. Melrose Nakayama, on Plavix   CHF (congestive heart failure) ([place])    Clostridium difficile colitis 05/2014   Presented with sepsis, required stool transplant   Coronary artery disease    Depression    Diverticulosis 11/23/2014   GERD (gastroesophageal reflux disease)    Hypothyroidism    Myocardial infarction [facility])    2009, 2013,2017   Stroke ([place])    NOV 2015   Suicide attempt (  [name])     Current Outpatient Medications  Medication Sig Dispense Refill   albuterol (VENTOLIN HFA) 108 (90 Base) MCG/ACT inhaler Inhale 2 puffs into the lungs every 4 (four) hours as needed for wheezing or shortness of breath.     apixaban (ELIQUIS) 2.5 MG TABS tablet Take 1 tablet (2.5 mg total) by mouth 2 (two) times daily.     atorvastatin (LIPITOR) 40 MG tablet TAKE 1 TABLET EVERY DAY 90 tablet 3   carvedilol (COREG) 3.125 MG tablet Take 1 tablet (3.125 mg total) by mouth 2 (two) times daily with a meal.     dapagliflozin propanediol (FARXIGA) 10 MG TABS tablet Take 1 tablet (10 mg total) by mouth daily. 30 tablet    donepezil (ARICEPT) 10 MG tablet Take 10 mg by mouth at bedtime.     furosemide (LASIX) 20 MG  tablet Take 20 mg by mouth every other day.     furosemide (LASIX) 40 MG tablet Take 40 mg by mouth every other day.     gabapentin (NEURONTIN) 100 MG capsule Take 1 capsule (100 mg total) by mouth daily.     isosorbide mononitrate (IMDUR) 30 MG 24 hr tablet Take 1 tablet (30 mg total) by mouth daily. 90 tablet 1   loperamide (IMODIUM A-D) 2 MG tablet Take 2 mg by mouth 2 (two) times daily as needed for diarrhea or loose stools.     mirtazapine (REMERON) 15 MG tablet Take 15 mg by mouth at bedtime.     Multiple Vitamin (MULTIVITAMIN WITH MINERALS) TABS tablet Take 1 tablet by mouth daily.     Nutritional Supplements (FEEDING SUPPLEMENT, NEPRO CARB STEADY,) LIQD Take 237 mLs by mouth 2 (two) times daily between meals.     omeprazole (PRILOSEC) 20 MG capsule Take 20 mg by mouth daily before breakfast.     potassium chloride SA (KLOR-CON M) 20 MEQ tablet Take 1 tablet (20 mEq total) by mouth daily.     tamsulosin (FLOMAX) 0.4 MG CAPS capsule TAKE 1 CAPSULE EVERY DAY 90 capsule 3   Tiotropium Bromide-Olodaterol (STIOLTO RESPIMAT) 2.5-2.5 MCG/ACT AERS Inhale 2 puffs into the lungs daily. 4 g 4   No current facility-administered medications for this encounter.    Allergies  Allergen Reactions   Misc. Sulfonamide Containing Compounds Anaphylaxis, Hives and Shortness Of Breath   Penicillins Shortness Of Breath and Rash    Has patient had a PCN reaction causing immediate rash, facial/tongue/throat swelling, SOB or lightheadedness with hypotension: Yes Has patient had a PCN reaction causing severe rash involving mucus membranes or skin necrosis: No Has patient had a PCN reaction that required hospitalization: No Has patient had a PCN reaction occurring within the last 10 years: Yes If all of the above answers are "NO", then may proceed with Cephalosporin use.    Sulfa Antibiotics Hives, Shortness Of Breath and Rash   Adhesive [Tape] Other (See Comments)    When removed, bad bruises resulted-  "allergic,' per MAR   Tramadol Nausea And Vomiting      Social History   Socioeconomic History   Marital status: Single    Spouse name: Not on file   Number of children: 0   Years of education: Not on file   Highest education level: High school graduate  Occupational History   Occupation: disabled  Tobacco Use   Smoking status: Former    Packs/day: 1.00    Years: 25.00    Total pack years: 25.00    Types: Cigarettes  Quit date: 06/03/2008    Years since quitting: 13.5   Smokeless tobacco: Never  Vaping Use   Vaping Use: Never used  Substance and Sexual Activity   Alcohol use: Not Currently   Drug use: No   Sexual activity: Not Currently  Other Topics Concern   Not on file  Social History Narrative   Not on file   Social Determinants of Health   Financial Resource Strain: Not on file  Food Insecurity: No Food Insecurity (12/17/2021)   Hunger Vital Sign    Worried About Running Out of Food in the Last Year: Never true    Ran Out of Food in the Last Year: Never true  Transportation Needs: No Transportation Needs (12/17/2021)   PRAPARE - Hydrologist (Medical): No    Lack of Transportation (Non-Medical): No  Physical Activity: Not on file  Stress: Not on file  Social Connections: Not on file  Intimate Partner Violence: Not on file      Family History  Problem Relation Age of Onset   Cancer Mother    Stroke Mother    Heart murmur Father    Stroke Father    Heart disease Sister    Stroke Sister    Stroke Brother    Heart disease Maternal Grandmother    Heart disease Maternal Grandfather    Heart disease Paternal Grandmother    Heart disease Paternal Grandfather    Cancer Brother    Colon cancer Neg Hx    Esophageal cancer Neg Hx    Stomach cancer Neg Hx    Rectal cancer Neg Hx     Vitals:   01/01/22 0949  BP: 110/70  Pulse: 80  SpO2: 96%  Weight: 52.3 kg (115 lb 6.4 oz)   Wt Readings from Last 3 Encounters:   01/01/22 52.3 kg (115 lb 6.4 oz)  12/25/21 47.6 kg (105 lb)  12/19/21 52.3 kg (115 lb 4.8 oz)    PHYSICAL EXAM: General: Thin, Arrived in a wheel chair. No respiratory difficulty HEENT: normal Neck: supple. no JVD. Carotids 2+ bilat; no bruits. No lymphadenopathy or thryomegaly appreciated. Cor: PMI nondisplaced. Regular rate & rhythm. No rubs, gallops or murmurs. Lungs: clear Abdomen: soft, nontender, nondistended. No hepatosplenomegaly. No bruits or masses. Good bowel sounds. Extremities: no cyanosis, clubbing, rash, edema Neuro: alert & oriented x 3, cranial nerves grossly intact. moves all 4 extremities w/o difficulty. Affect pleasant.  ECG: SR 79 bpm personally reviewed.    ASSESSMENT & PLAN: 1. HFrEf, ICM Echo 03/2021 EF 40-45% but down to 20-25% in May of this year.  NYHA II GDMT  - Volume status appears stable.  Diuretic-Continue lasix every other day.  BB-Continue low dose carvedilol  Ace/ARB/ARNI- no elevated creatinine  MRA- hold off with dizziness.  SGLT2i- Continue farxiga 10 mg daily - GDMT limited by dizziness/soft SBP. Down the road may be able to start spironolactone. Once started could stop potassium.  - Could repeat ECHO in 3 -4 months to assess for recovery.   2. PAF -In SR today. He also has ILR.  -On Eliquis 5 mg twice a day . CBC from 12/25/21 stable.  -Continue low dose carvedilol .  3. CAD  H/O CABG x3  On statin, bb, and imdur. Also on eliquis so no aspirin.  At some point may need myoview. Currently no chest pain.   4. CKD Stage IIIa  -Creatinine baseline 1.4-1.6 -I reviewed BMET from 12/25/21 -->creatinine 1.5.   5.  Dysphagia -H/O Aspiration PNA  10/2021 -Followed by Speech at SNF  6. Dementia  - Doing ok today. He is unsure about the year. Cooperative and able to provide answers to other questions.    I reviewed recent hospitalizations. No medication changes today given ongoing dizziness. GDMT limited by hypotension, dizziness, and CKD  Stage IIIa. He does not appear to be a candidate for advanced therapies due to co-morbidities/dementia and appears cachetic.    Referred to HFSW (PCP, Medications, Transportation, ETOH Abuse, Drug Abuse, Insurance, Financial ):  No Refer to Pharmacy: No Refer to Home Health: No Refer to Advanced Heart Failure Clinic: No   Refer to General Cardiology: Establish with Dr Orene Desanctis. Last seen in 2019.   Follow up as needed. We set up f/u with Dr Sallyanne Kuster.  Keiasha Diep NP-C  11:12 AM

## 2022-01-23 ENCOUNTER — Encounter: Payer: Self-pay | Admitting: Cardiovascular Disease

## 2022-03-05 ENCOUNTER — Ambulatory Visit (INDEPENDENT_AMBULATORY_CARE_PROVIDER_SITE_OTHER): Payer: Medicare HMO | Admitting: Urology

## 2022-03-05 ENCOUNTER — Encounter: Payer: Self-pay | Admitting: Urology

## 2022-03-05 VITALS — BP 117/77 | HR 101 | Ht 64.0 in | Wt 144.0 lb

## 2022-03-05 DIAGNOSIS — R300 Dysuria: Secondary | ICD-10-CM | POA: Diagnosis not present

## 2022-03-05 LAB — BLADDER SCAN AMB NON-IMAGING: Scan Result: 0
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
## 2022-09-29 DEATH — deceased
